# Patient Record
Sex: Female | Born: 1965
Health system: Southern US, Community
[De-identification: ages and names within clinical notes are randomized; demographics above are authoritative.]

## PROBLEM LIST (undated history)

## (undated) DIAGNOSIS — E785 Hyperlipidemia, unspecified: Secondary | ICD-10-CM

## (undated) DIAGNOSIS — J45909 Unspecified asthma, uncomplicated: Secondary | ICD-10-CM

## (undated) DIAGNOSIS — D219 Benign neoplasm of connective and other soft tissue, unspecified: Secondary | ICD-10-CM

## (undated) DIAGNOSIS — K219 Gastro-esophageal reflux disease without esophagitis: Secondary | ICD-10-CM

## (undated) DIAGNOSIS — M7750 Other enthesopathy of unspecified foot: Secondary | ICD-10-CM

## (undated) DIAGNOSIS — I10 Essential (primary) hypertension: Secondary | ICD-10-CM

## (undated) DIAGNOSIS — M1990 Unspecified osteoarthritis, unspecified site: Secondary | ICD-10-CM

## (undated) HISTORY — PX: WISDOM TOOTH EXTRACTION: SHX21

---

## 1998-06-02 ENCOUNTER — Other Ambulatory Visit: Admission: RE | Admit: 1998-06-02 | Discharge: 1998-06-02 | Payer: Self-pay | Admitting: *Deleted

## 1998-07-05 ENCOUNTER — Emergency Department (HOSPITAL_COMMUNITY): Admission: EM | Admit: 1998-07-05 | Discharge: 1998-07-05 | Payer: Self-pay | Admitting: Emergency Medicine

## 1998-09-09 ENCOUNTER — Emergency Department (HOSPITAL_COMMUNITY): Admission: EM | Admit: 1998-09-09 | Discharge: 1998-09-09 | Payer: Self-pay | Admitting: Emergency Medicine

## 1998-09-16 HISTORY — PX: CHOLECYSTECTOMY: SHX55

## 1998-11-06 ENCOUNTER — Encounter: Payer: Self-pay | Admitting: Internal Medicine

## 1998-11-06 ENCOUNTER — Ambulatory Visit (HOSPITAL_COMMUNITY): Admission: RE | Admit: 1998-11-06 | Discharge: 1998-11-06 | Payer: Self-pay | Admitting: Internal Medicine

## 1999-01-26 ENCOUNTER — Encounter: Payer: Self-pay | Admitting: General Surgery

## 1999-02-02 ENCOUNTER — Observation Stay (HOSPITAL_COMMUNITY): Admission: RE | Admit: 1999-02-02 | Discharge: 1999-02-03 | Payer: Self-pay | Admitting: General Surgery

## 2007-05-11 ENCOUNTER — Encounter: Admission: RE | Admit: 2007-05-11 | Discharge: 2007-07-31 | Payer: Self-pay | Admitting: Occupational Medicine

## 2014-04-06 ENCOUNTER — Ambulatory Visit
Admission: RE | Admit: 2014-04-06 | Discharge: 2014-04-06 | Disposition: A | Discharge status: Home / Self Care | Payer: 59 | Source: Ambulatory Visit | Attending: Family Medicine | Admitting: Family Medicine

## 2014-04-06 ENCOUNTER — Other Ambulatory Visit: Payer: Self-pay | Admitting: Family Medicine

## 2014-04-06 DIAGNOSIS — R1084 Generalized abdominal pain: Secondary | ICD-10-CM

## 2014-04-06 DIAGNOSIS — K5900 Constipation, unspecified: Secondary | ICD-10-CM

## 2014-04-07 ENCOUNTER — Other Ambulatory Visit: Payer: Self-pay | Admitting: Family Medicine

## 2014-04-07 DIAGNOSIS — R935 Abnormal findings on diagnostic imaging of other abdominal regions, including retroperitoneum: Secondary | ICD-10-CM

## 2014-04-08 ENCOUNTER — Ambulatory Visit
Admission: RE | Admit: 2014-04-08 | Discharge: 2014-04-08 | Disposition: A | Discharge status: Home / Self Care | Payer: 59 | Source: Ambulatory Visit | Attending: Family Medicine | Admitting: Family Medicine

## 2014-04-08 DIAGNOSIS — R935 Abnormal findings on diagnostic imaging of other abdominal regions, including retroperitoneum: Secondary | ICD-10-CM

## 2014-04-08 MED ORDER — IOHEXOL 300 MG/ML  SOLN
125.0000 mL | Freq: Once | INTRAMUSCULAR | Status: AC | PRN
  Administered 2014-04-08: 125 mL via INTRAVENOUS

## 2014-04-11 ENCOUNTER — Encounter (HOSPITAL_COMMUNITY): Payer: Self-pay | Admitting: Emergency Medicine

## 2014-04-11 ENCOUNTER — Emergency Department (HOSPITAL_COMMUNITY)
Admission: EM | Admit: 2014-04-11 | Discharge: 2014-04-11 | Disposition: A | Discharge status: Home / Self Care | Payer: 59 | Attending: Emergency Medicine | Admitting: Emergency Medicine

## 2014-04-11 DIAGNOSIS — R109 Unspecified abdominal pain: Secondary | ICD-10-CM | POA: Insufficient documentation

## 2014-04-11 DIAGNOSIS — Z8739 Personal history of other diseases of the musculoskeletal system and connective tissue: Secondary | ICD-10-CM | POA: Insufficient documentation

## 2014-04-11 DIAGNOSIS — N852 Hypertrophy of uterus: Secondary | ICD-10-CM | POA: Insufficient documentation

## 2014-04-11 DIAGNOSIS — R1110 Vomiting, unspecified: Secondary | ICD-10-CM

## 2014-04-11 DIAGNOSIS — K5900 Constipation, unspecified: Secondary | ICD-10-CM | POA: Insufficient documentation

## 2014-04-11 DIAGNOSIS — R112 Nausea with vomiting, unspecified: Secondary | ICD-10-CM | POA: Insufficient documentation

## 2014-04-11 DIAGNOSIS — J45909 Unspecified asthma, uncomplicated: Secondary | ICD-10-CM | POA: Insufficient documentation

## 2014-04-11 DIAGNOSIS — Z79899 Other long term (current) drug therapy: Secondary | ICD-10-CM | POA: Insufficient documentation

## 2014-04-11 DIAGNOSIS — I10 Essential (primary) hypertension: Secondary | ICD-10-CM | POA: Insufficient documentation

## 2014-04-11 HISTORY — DX: Unspecified asthma, uncomplicated: J45.909

## 2014-04-11 HISTORY — DX: Essential (primary) hypertension: I10

## 2014-04-11 HISTORY — DX: Other enthesopathy of unspecified foot and ankle: M77.50

## 2014-04-11 HISTORY — DX: Unspecified osteoarthritis, unspecified site: M19.90

## 2014-04-11 LAB — URINE MICROSCOPIC-ADD ON

## 2014-04-11 LAB — LIPASE, BLOOD: LIPASE: 24 U/L (ref 11–59)

## 2014-04-11 LAB — URINALYSIS, ROUTINE W REFLEX MICROSCOPIC
BILIRUBIN URINE: NEGATIVE
Glucose, UA: NEGATIVE mg/dL
KETONES UR: NEGATIVE mg/dL
NITRITE: NEGATIVE
Protein, ur: 30 mg/dL — AB
Specific Gravity, Urine: 1.021 (ref 1.005–1.030)
Urobilinogen, UA: 4 mg/dL — ABNORMAL HIGH (ref 0.0–1.0)
pH: 7 (ref 5.0–8.0)

## 2014-04-11 LAB — COMPREHENSIVE METABOLIC PANEL
ALT: 10 U/L (ref 0–35)
ANION GAP: 12 (ref 5–15)
AST: 14 U/L (ref 0–37)
Albumin: 3 g/dL — ABNORMAL LOW (ref 3.5–5.2)
Alkaline Phosphatase: 78 U/L (ref 39–117)
BUN: 11 mg/dL (ref 6–23)
CALCIUM: 8.9 mg/dL (ref 8.4–10.5)
CHLORIDE: 100 meq/L (ref 96–112)
CO2: 27 meq/L (ref 19–32)
CREATININE: 0.56 mg/dL (ref 0.50–1.10)
GFR calc Af Amer: >90 mL/min (ref >90)
Glucose, Bld: 106 mg/dL — ABNORMAL HIGH (ref 70–99)
Potassium: 3.3 mEq/L — ABNORMAL LOW (ref 3.7–5.3)
Sodium: 139 mEq/L (ref 137–147)
Total Bilirubin: 0.4 mg/dL (ref 0.3–1.2)
Total Protein: 8.6 g/dL — ABNORMAL HIGH (ref 6.0–8.3)

## 2014-04-11 LAB — CBC WITH DIFFERENTIAL/PLATELET
Basophils Absolute: 0.1 10*3/uL (ref 0.0–0.1)
Basophils Relative: 1 % (ref 0–1)
Eosinophils Absolute: 0.2 10*3/uL (ref 0.0–0.7)
Eosinophils Relative: 3 % (ref 0–5)
HEMATOCRIT: 32 % — AB (ref 36.0–46.0)
Hemoglobin: 10.2 g/dL — ABNORMAL LOW (ref 12.0–15.0)
LYMPHS ABS: 0.9 10*3/uL (ref 0.7–4.0)
Lymphocytes Relative: 14 % (ref 12–46)
MCH: 23.3 pg — AB (ref 26.0–34.0)
MCHC: 31.9 g/dL (ref 30.0–36.0)
MCV: 73.2 fL — AB (ref 78.0–100.0)
MONO ABS: 0.9 10*3/uL (ref 0.1–1.0)
MONOS PCT: 14 % — AB (ref 3–12)
NEUTROS ABS: 4.3 10*3/uL (ref 1.7–7.7)
Neutrophils Relative %: 68 % (ref 43–77)
PLATELETS: 315 10*3/uL (ref 150–400)
RBC: 4.37 MIL/uL (ref 3.87–5.11)
RDW: 18.8 % — ABNORMAL HIGH (ref 11.5–15.5)
WBC: 6.4 10*3/uL (ref 4.0–10.5)

## 2014-04-11 MED ORDER — ONDANSETRON HCL 4 MG/2ML IJ SOLN
4.0000 mg | Freq: Once | INTRAMUSCULAR | Status: AC
  Administered 2014-04-11: 4 mg via INTRAVENOUS

## 2014-04-11 MED ORDER — ONDANSETRON 4 MG PO TBDP: ORAL_TABLET | ORAL | Status: DC

## 2014-04-11 MED ORDER — ONDANSETRON HCL 4 MG/2ML IJ SOLN
4.0000 mg | Freq: Once | INTRAMUSCULAR | Status: AC
  Filled 2014-04-11: qty 2

## 2014-04-11 MED ORDER — SODIUM CHLORIDE 0.9 % IV BOLUS (SEPSIS)
1000.0000 mL | Freq: Once | INTRAVENOUS | Status: AC
Start: 2014-04-11 — End: 2014-04-11
  Administered 2014-04-11: 1000 mL via INTRAVENOUS

## 2014-04-11 MED ORDER — TRAMADOL HCL 50 MG PO TABS: 50.0000 mg | ORAL_TABLET | Freq: Four times a day (QID) | ORAL | Status: DC | PRN

## 2014-04-11 MED ORDER — POTASSIUM CHLORIDE CRYS ER 20 MEQ PO TBCR
40.0000 meq | EXTENDED_RELEASE_TABLET | Freq: Once | ORAL | Status: AC
  Administered 2014-04-11: 40 meq via ORAL
  Filled 2014-04-11: qty 2

## 2014-04-11 NOTE — ED Notes (Signed)
Pt states that she has had n/v since Wednesday. Pt states she had an xray last Wednesday and a CT on Friday at [facility] because she has had constipation x 2 weeks.

## 2014-04-11 NOTE — Discharge Instructions (Signed)
Call and make an appointment to followup with your OB/GYN. Make sure you take in plenty of fluids. Return immediately for persistent vomiting, inability to pass gas, worsening pain or for any concerns or  Uterine Fibroid A uterine fibroid is a growth (tumor) that occurs in your uterus. This type of tumor is not cancerous and does not spread out of the uterus. You can have one or many fibroids. Fibroids can vary in size, weight, and where they grow in the uterus. Some can become quite large. Most fibroids do not require medical treatment, but some can cause pain or heavy bleeding during and between periods. CAUSES  A fibroid is the result of a single uterine cell that keeps growing (unregulated), which is different than most cells in the human body. Most cells have a control mechanism that keeps them from reproducing without control.  SIGNS AND SYMPTOMS   Bleeding.  Pelvic pain and pressure.  Bladder problems due to the size of the fibroid.  Infertility and miscarriages depending on the size and location of the fibroid. DIAGNOSIS  Uterine fibroids are diagnosed through a physical exam. Your health care provider may feel the lumpy tumors during a pelvic exam. Ultrasonography may be done to get information regarding size, location, and number of tumors.  TREATMENT   Your health care provider may recommend watchful waiting. This involves getting the fibroid checked by your health care provider to see if it grows or shrinks.   Hormone treatment or an intrauterine device (IUD) may be prescribed.   Surgery may be needed to remove the fibroids (myomectomy) or the uterus (hysterectomy). This depends on your situation. When fibroids interfere with fertility and a woman wants to become pregnant, a health care provider may recommend having the fibroids removed.  [place] care depends on how you were treated. In general:   Keep all follow-up appointments with your health care  provider.   Only take over-the-counter or prescription medicines as directed by your health care provider. If you were prescribed a hormone treatment, take the hormone medicines exactly as directed. Do not take aspirin. It can cause bleeding.   Talk to your health care provider about taking iron pills.  If your periods are troublesome but not so heavy, lie down with your feet raised slightly above your heart. Place cold packs on your lower abdomen.   If your periods are heavy, write down the number of pads or tampons you use per month. Bring this information to your health care provider.   Include green vegetables in your diet.  SEEK IMMEDIATE MEDICAL CARE IF:  You have pelvic pain or cramps not controlled with medicines.   You have a sudden increase in pelvic pain.   You have an increase in bleeding between and during periods.   You have excessive periods and soak tampons or pads in a half hour or less.  You feel lightheaded or have fainting episodes. Document Released: 08/30/2000 Document Revised: 06/23/2013 Document Reviewed: 04/01/2013 [facility] Patient Information 2015 [place], [place]. This information is not intended to replace advice given to you by your health care provider. Make sure you discuss any questions you have with your health care provider.

## 2014-04-11 NOTE — ED Provider Notes (Signed)
CSN: [id number]     Arrival date & time 04/11/14  3785 History   First MD Initiated Contact with Patient 04/11/14 [phone]     Chief Complaint  Patient presents with  . Emesis  . Abdominal Pain     (Consider location/radiation/quality/duration/timing/severity/associated sxs/prior Treatment) HPI Patient states she's had intermittent nausea and vomiting for the past 6 days. She had a CT scan on Friday but does not have the results. She states she's been having difficulty having a bowel movement for the past 2 weeks. She is passing gas freely. Denies any fevers or chills. She has irregular heavy periods. She denies any urinary symptoms. She was started on Colace and MiraLax by her primary Dr. Past Medical History  Diagnosis Date  . Hypertension   . Tendonitis of foot   . Arthritis   . Asthma    Past Surgical History  Procedure Laterality Date  . Cholecystectomy  2000   No family history on file. History  Substance Use Topics  . Smoking status: Never Smoker   . Smokeless tobacco: Never Used  . Alcohol Use: No   OB History   Grav Para Term Preterm Abortions TAB SAB Ect Mult Living                 Review of Systems  Constitutional: Negative for fever and chills.  Respiratory: Negative for cough and shortness of breath.   Cardiovascular: Negative for chest pain.  Gastrointestinal: Positive for nausea, vomiting, abdominal pain and constipation. Negative for diarrhea.  Genitourinary: Negative for dysuria, frequency, hematuria, flank pain, enuresis and difficulty urinating.  Musculoskeletal: Negative for back pain, neck pain and neck stiffness.  Skin: Negative for rash and wound.  Neurological: Negative for dizziness, weakness, light-headedness, numbness and headaches.  All other systems reviewed and are negative.     Allergies  Review of patient's allergies indicates no known allergies.  Home Medications   Prior to Admission medications   Medication Sig Start Date End Date  Taking? Authorizing Provider  amLODipine (NORVASC) 2.5 MG tablet Take 2.5 mg by mouth daily.   Yes Historical Provider, MD  atorvastatin (LIPITOR) 40 MG tablet Take 40 mg by mouth daily.   Yes Historical Provider, MD  cholecalciferol (VITAMIN D) 1000 UNITS tablet Take 1,000 Units by mouth daily.   Yes Historical Provider, MD  docusate sodium (COLACE) 100 MG capsule Take 200 mg by mouth 2 (two) times daily.   Yes Historical Provider, MD   BP 128/79  Pulse 95  Temp(Src) 99.1 F (37.3 C) (Oral)  Resp 17  Ht 5\' 10"  (1.778 m)  Wt 253 lb (114.76 kg)  BMI 36.30 kg/m2  SpO2 99%  LMP 03/31/2014 Physical Exam  Nursing note and vitals reviewed. Constitutional: She is oriented to person, place, and time. She appears well-developed and well-nourished. No distress.  HENT:  Head: Normocephalic and atraumatic.  Mouth/Throat: Oropharynx is clear and moist.  Eyes: EOM are normal. Pupils are equal, round, and reactive to light.  Neck: Normal range of motion. Neck supple.  Cardiovascular: Normal rate and regular rhythm.   Pulmonary/Chest: Effort normal and breath sounds normal. No respiratory distress. She has no wheezes. She has no rales. She exhibits no tenderness.  Abdominal: Soft. Bowel sounds are normal. She exhibits mass. She exhibits no distension. There is no tenderness. There is no rebound and no guarding (large mass in the pelvic region.).  Musculoskeletal: Normal range of motion. She exhibits no edema and no tenderness.  No CVA tenderness bilaterally.  Neurological: She is alert and oriented to person, place, and time.  Moves all extremities without deficit. Sensation is grossly intact.  Skin: Skin is warm and dry. No rash noted. No erythema.  Psychiatric: She has a normal mood and affect. Her behavior is normal.    ED Course  Procedures (including critical care time) Labs Review Labs Reviewed  CBC WITH DIFFERENTIAL - Abnormal; Notable for the following:    Hemoglobin 10.2 (*)    HCT  32.0 (*)    MCV 73.2 (*)    MCH 23.3 (*)    RDW 18.8 (*)    All other components within normal limits  COMPREHENSIVE METABOLIC PANEL  LIPASE, BLOOD  URINALYSIS, ROUTINE W REFLEX MICROSCOPIC    Imaging Review No results found.   EKG Interpretation None      MDM   Final diagnoses:  None    Review of CT scan from Friday reveals markedly enlarged uterus with multiple urine masses displacing the bowel. No evidence of obstruction. We'll give IV fluids and treat nausea. We'll check electrolytes. The patient will need to followup with her OB/GYN for likely hysterectomy.  Mild hypokalemia replaced in the emergency department. Patient given IV fluids. No further vomiting in the emergency department. Return precautions given.  Julianne Rice, MD 04/11/14 [phone]

## 2014-05-30 ENCOUNTER — Encounter (HOSPITAL_COMMUNITY): Payer: Self-pay | Admitting: Pharmacist

## 2014-06-05 ENCOUNTER — Other Ambulatory Visit: Payer: Self-pay | Admitting: Obstetrics and Gynecology

## 2014-06-05 NOTE — H&P (Signed)
48 y.o. yo complains of a massively enlarged sx fibroid uterus.  Presented with c/o intermittent achy abdominal pain upper and lower which started with n/v; Pt. Was taking Zofran which has helped;  taking stool softener for constipation. Pt has massive fibroid uterus with multiple fibroids; on CT, unable to tell if degenerating or if sarcomatous. EM not seen today. Bleeding with cycles only - some months heavy and sometimes lighter. Lasting 7 days. NO IM at all, no postcoital bleeding.  Past Medical History  Diagnosis Date  . Hypertension   . Tendonitis of foot   . Arthritis   . Asthma    Past Surgical History  Procedure Laterality Date  . Cholecystectomy  2000    History   Social History  . Marital Status: Single    Spouse Name: N/A    Number of Children: N/A  . Years of Education: N/A   Occupational History  . Not on file.   Social History Main Topics  . Smoking status: Never Smoker   . Smokeless tobacco: Never Used  . Alcohol Use: No  . Drug Use: No  . Sexual Activity: Not on file   Other Topics Concern  . Not on file   Social History Narrative  . No narrative on file    No current facility-administered medications on file prior to encounter.   Current Outpatient Prescriptions on File Prior to Encounter  Medication Sig Dispense Refill  . atorvastatin (LIPITOR) 40 MG tablet Take 40 mg by mouth at bedtime.       . docusate sodium (COLACE) 100 MG capsule Take 200 mg by mouth daily.         No Known Allergies  @VITALS2 @  Lungs: clear to ascultation Cor:  RRR Abdomen:  soft, nontender, nondistended. Ex:  no cords, erythema Pelvic:  24-25 week size uterus with normal cervix and EFG.  [place] confirmed 22x13x13 size uterus with multiple fibroids.  A:  Sx and huge fibroid uterus with degen change unable to r/o sarcoma. D/w pt at length risks of sarcoma and possible need for further surgery if cancer. Quoted 1:1000 risk. Pt understands and strongly desires  TAH/Salpingectomies with me for now. I spent at least 15 minutes face to face with patient and instructed her on expected course of therapy, diagnosis and side effects. Pt voiced understanding. Plan to do TAH/salpingectomies/possible BSO/possible staging. Understands may have vertical incision on abdomen.  Pt to had surgical risks evaluated by PCP- low risk   P:  All risks, benefits and alternatives d/w patient and she desires to proceed.  Patient has undergone a modified bowel prep and will receive preop antibiotics and SCDs during the operation.     Jaslyn Bansal A

## 2014-06-06 ENCOUNTER — Encounter (HOSPITAL_COMMUNITY): Payer: Self-pay

## 2014-06-06 ENCOUNTER — Encounter (HOSPITAL_COMMUNITY)
Admission: RE | Admit: 2014-06-06 | Discharge: 2014-06-06 | Disposition: A | Discharge status: Home / Self Care | Payer: 59 | Source: Ambulatory Visit | Attending: Obstetrics and Gynecology | Admitting: Obstetrics and Gynecology

## 2014-06-06 DIAGNOSIS — I10 Essential (primary) hypertension: Secondary | ICD-10-CM | POA: Diagnosis not present

## 2014-06-06 DIAGNOSIS — J45909 Unspecified asthma, uncomplicated: Secondary | ICD-10-CM | POA: Diagnosis not present

## 2014-06-06 DIAGNOSIS — Z23 Encounter for immunization: Secondary | ICD-10-CM | POA: Diagnosis not present

## 2014-06-06 DIAGNOSIS — M129 Arthropathy, unspecified: Secondary | ICD-10-CM | POA: Diagnosis not present

## 2014-06-06 DIAGNOSIS — N831 Corpus luteum cyst of ovary, unspecified side: Secondary | ICD-10-CM | POA: Diagnosis not present

## 2014-06-06 DIAGNOSIS — D259 Leiomyoma of uterus, unspecified: Secondary | ICD-10-CM | POA: Diagnosis not present

## 2014-06-06 HISTORY — DX: Hyperlipidemia, unspecified: E78.5

## 2014-06-06 HISTORY — DX: Gastro-esophageal reflux disease without esophagitis: K21.9

## 2014-06-06 HISTORY — DX: Benign neoplasm of connective and other soft tissue, unspecified: D21.9

## 2014-06-06 LAB — BASIC METABOLIC PANEL
Anion gap: 11 (ref 5–15)
BUN: 20 mg/dL (ref 6–23)
CALCIUM: 8.6 mg/dL (ref 8.4–10.5)
CO2: 26 mEq/L (ref 19–32)
Chloride: 101 mEq/L (ref 96–112)
Creatinine, Ser: 0.53 mg/dL (ref 0.50–1.10)
GFR calc Af Amer: >90 mL/min (ref >90)
Glucose, Bld: 97 mg/dL (ref 70–99)
Potassium: 3.9 mEq/L (ref 3.7–5.3)
SODIUM: 138 meq/L (ref 137–147)

## 2014-06-06 LAB — CBC
HEMATOCRIT: 28.6 % — AB (ref 36.0–46.0)
Hemoglobin: 8.8 g/dL — ABNORMAL LOW (ref 12.0–15.0)
MCH: 21.8 pg — AB (ref 26.0–34.0)
MCHC: 30.8 g/dL (ref 30.0–36.0)
MCV: 70.8 fL — ABNORMAL LOW (ref 78.0–100.0)
Platelets: 296 10*3/uL (ref 150–400)
RBC: 4.04 MIL/uL (ref 3.87–5.11)
RDW: 16.4 % — ABNORMAL HIGH (ref 11.5–15.5)
WBC: 4.9 10*3/uL (ref 4.0–10.5)

## 2014-06-06 NOTE — Patient Instructions (Addendum)
   Your procedure is scheduled on:  Wednesday, Sept 23  Enter through the [employer] [facility] at: 8 AM Pick up the phone at the desk and dial [phone] and inform us of your arrival.  Please call this number if you have any problems the morning of surgery: [phone]  Remember: Do not eat or drink after midnight: Tuesday Take these medicines the morning of surgery with a SIP OF WATER:  Lisinopril, prevacid  Do not wear jewelry, make-up, or FINGER nail polish No metal in your hair or on your body. Do not wear lotions, powders, perfumes.  You may wear deodorant.  Do not bring valuables to the hospital. Contacts, dentures or bridgework may not be worn into surgery.  Leave suitcase in the car. After Surgery it may be brought to your room. For patients being admitted to the hospital, checkout time is 11:00am the day of discharge.  Home with - to be arranged prior to surgery.

## 2014-06-07 MED ORDER — CEFAZOLIN SODIUM-DEXTROSE 2-3 GM-% IV SOLR
2.0000 g | INTRAVENOUS | Status: AC
  Administered 2014-06-08: 2 g via INTRAVENOUS

## 2014-06-08 ENCOUNTER — Encounter (HOSPITAL_COMMUNITY)
Admission: RE | Disposition: A | Discharge status: Home / Self Care | Payer: Self-pay | Source: Ambulatory Visit | Attending: Obstetrics and Gynecology

## 2014-06-08 ENCOUNTER — Ambulatory Visit (HOSPITAL_COMMUNITY)
Admission: RE | Admit: 2014-06-08 | Discharge: 2014-06-10 | Disposition: A | Discharge status: Home / Self Care | Payer: 59 | Source: Ambulatory Visit | Attending: Obstetrics and Gynecology | Admitting: Obstetrics and Gynecology

## 2014-06-08 ENCOUNTER — Encounter (HOSPITAL_COMMUNITY): Payer: Self-pay | Admitting: Certified Registered Nurse Anesthetist

## 2014-06-08 ENCOUNTER — Encounter (HOSPITAL_COMMUNITY): Payer: 59 | Admitting: Certified Registered Nurse Anesthetist

## 2014-06-08 ENCOUNTER — Inpatient Hospital Stay (HOSPITAL_COMMUNITY): Payer: 59 | Admitting: Certified Registered Nurse Anesthetist

## 2014-06-08 DIAGNOSIS — M129 Arthropathy, unspecified: Secondary | ICD-10-CM | POA: Insufficient documentation

## 2014-06-08 DIAGNOSIS — D259 Leiomyoma of uterus, unspecified: Principal | ICD-10-CM | POA: Insufficient documentation

## 2014-06-08 DIAGNOSIS — J45909 Unspecified asthma, uncomplicated: Secondary | ICD-10-CM | POA: Insufficient documentation

## 2014-06-08 DIAGNOSIS — Z23 Encounter for immunization: Secondary | ICD-10-CM | POA: Insufficient documentation

## 2014-06-08 DIAGNOSIS — N831 Corpus luteum cyst of ovary, unspecified side: Secondary | ICD-10-CM | POA: Insufficient documentation

## 2014-06-08 DIAGNOSIS — Z9889 Other specified postprocedural states: Secondary | ICD-10-CM

## 2014-06-08 DIAGNOSIS — I10 Essential (primary) hypertension: Secondary | ICD-10-CM | POA: Insufficient documentation

## 2014-06-08 HISTORY — PX: OOPHORECTOMY: SHX6387

## 2014-06-08 HISTORY — PX: ABDOMINAL HYSTERECTOMY: SHX81

## 2014-06-08 HISTORY — PX: BILATERAL SALPINGECTOMY: SHX5743

## 2014-06-08 LAB — ABO/RH: ABO/RH(D): A POS

## 2014-06-08 LAB — CBC
HCT: 23.8 % — ABNORMAL LOW (ref 36.0–46.0)
Hemoglobin: 7.5 g/dL — ABNORMAL LOW (ref 12.0–15.0)
MCH: 22.1 pg — ABNORMAL LOW (ref 26.0–34.0)
MCHC: 31.5 g/dL (ref 30.0–36.0)
MCV: 70.2 fL — ABNORMAL LOW (ref 78.0–100.0)
Platelets: 249 10*3/uL (ref 150–400)
RBC: 3.39 MIL/uL — ABNORMAL LOW (ref 3.87–5.11)
RDW: 16.3 % — AB (ref 11.5–15.5)
WBC: 13.3 10*3/uL — AB (ref 4.0–10.5)

## 2014-06-08 LAB — PREPARE RBC (CROSSMATCH)

## 2014-06-08 LAB — PREGNANCY, URINE: PREG TEST UR: NEGATIVE

## 2014-06-08 SURGERY — HYSTERECTOMY, ABDOMINAL
Anesthesia: General | Site: Abdomen | Laterality: Right

## 2014-06-08 MED ORDER — KETOROLAC TROMETHAMINE 30 MG/ML IJ SOLN: 30.0000 mg | Freq: Four times a day (QID) | INTRAMUSCULAR | Status: DC

## 2014-06-08 MED ORDER — ACETAMINOPHEN 160 MG/5ML PO SOLN
ORAL | Status: AC
  Administered 2014-06-08: 960 mg via ORAL
  Filled 2014-06-08: qty 40.6

## 2014-06-08 MED ORDER — KETOROLAC TROMETHAMINE 30 MG/ML IJ SOLN
INTRAMUSCULAR | Status: DC | PRN
  Administered 2014-06-08: 30 mg via INTRAVENOUS

## 2014-06-08 MED ORDER — LACTATED RINGERS IV SOLN
INTRAVENOUS | Status: DC
  Administered 2014-06-08 (×3): via INTRAVENOUS

## 2014-06-08 MED ORDER — PHENYLEPHRINE 40 MCG/ML (10ML) SYRINGE FOR IV PUSH (FOR BLOOD PRESSURE SUPPORT)
PREFILLED_SYRINGE | INTRAVENOUS | Status: AC
  Filled 2014-06-08: qty 5

## 2014-06-08 MED ORDER — DOCUSATE SODIUM 100 MG PO CAPS
200.0000 mg | ORAL_CAPSULE | Freq: Every day | ORAL | Status: DC
  Administered 2014-06-09 – 2014-06-10 (×2): 200 mg via ORAL
  Filled 2014-06-08 (×2): qty 2

## 2014-06-08 MED ORDER — SCOPOLAMINE 1 MG/3DAYS TD PT72
MEDICATED_PATCH | TRANSDERMAL | Status: DC
Start: 2014-06-08 — End: 2014-06-10
  Administered 2014-06-08: 1.5 mg via TRANSDERMAL
  Filled 2014-06-08: qty 1

## 2014-06-08 MED ORDER — KETOROLAC TROMETHAMINE 30 MG/ML IJ SOLN
INTRAMUSCULAR | Status: AC
  Filled 2014-06-08: qty 1

## 2014-06-08 MED ORDER — FENTANYL CITRATE 0.05 MG/ML IJ SOLN
INTRAMUSCULAR | Status: AC
  Filled 2014-06-08: qty 5

## 2014-06-08 MED ORDER — PHENYLEPHRINE HCL 10 MG/ML IJ SOLN
INTRAMUSCULAR | Status: DC | PRN
  Administered 2014-06-08: 40 ug via INTRAVENOUS

## 2014-06-08 MED ORDER — INFLUENZA VAC SPLIT QUAD 0.5 ML IM SUSY
0.5000 mL | PREFILLED_SYRINGE | INTRAMUSCULAR | Status: AC
  Administered 2014-06-09: 0.5 mL via INTRAMUSCULAR

## 2014-06-08 MED ORDER — HYDROMORPHONE HCL 1 MG/ML IJ SOLN
INTRAMUSCULAR | Status: AC
  Administered 2014-06-08: 0.5 mg via INTRAVENOUS
  Filled 2014-06-08: qty 1

## 2014-06-08 MED ORDER — DEXAMETHASONE SODIUM PHOSPHATE 4 MG/ML IJ SOLN
INTRAMUSCULAR | Status: AC
  Filled 2014-06-08: qty 1

## 2014-06-08 MED ORDER — DEXMEDETOMIDINE HCL 200 MCG/2ML IV SOLN
INTRAVENOUS | Status: DC | PRN
  Administered 2014-06-08 (×10): 4 ug via INTRAVENOUS

## 2014-06-08 MED ORDER — OXYCODONE-ACETAMINOPHEN 5-325 MG PO TABS
1.0000 | ORAL_TABLET | ORAL | Status: DC | PRN
  Administered 2014-06-08 (×2): 2 via ORAL
  Administered 2014-06-09: 1 via ORAL
  Administered 2014-06-09 (×2): 2 via ORAL
  Administered 2014-06-09: 1 via ORAL
  Administered 2014-06-09 – 2014-06-10 (×2): 2 via ORAL
  Filled 2014-06-08: qty 2
  Filled 2014-06-08: qty 1
  Filled 2014-06-08 (×6): qty 2

## 2014-06-08 MED ORDER — ROCURONIUM BROMIDE 100 MG/10ML IV SOLN
INTRAVENOUS | Status: AC
  Filled 2014-06-08: qty 1

## 2014-06-08 MED ORDER — IBUPROFEN 800 MG PO TABS
800.0000 mg | ORAL_TABLET | Freq: Three times a day (TID) | ORAL | Status: DC | PRN
  Administered 2014-06-08 – 2014-06-10 (×4): 800 mg via ORAL
  Filled 2014-06-08 (×4): qty 1

## 2014-06-08 MED ORDER — NEOSTIGMINE METHYLSULFATE 10 MG/10ML IV SOLN
INTRAVENOUS | Status: DC | PRN
  Administered 2014-06-08: 5 mg via INTRAVENOUS

## 2014-06-08 MED ORDER — HYDROMORPHONE HCL 1 MG/ML IJ SOLN
0.2500 mg | INTRAMUSCULAR | Status: DC | PRN
  Administered 2014-06-08 (×4): 0.5 mg via INTRAVENOUS

## 2014-06-08 MED ORDER — ONDANSETRON HCL 4 MG/2ML IJ SOLN
INTRAMUSCULAR | Status: DC | PRN
  Administered 2014-06-08: 4 mg via INTRAVENOUS

## 2014-06-08 MED ORDER — LIDOCAINE HCL (CARDIAC) 20 MG/ML IV SOLN
INTRAVENOUS | Status: AC
  Filled 2014-06-08: qty 5

## 2014-06-08 MED ORDER — SCOPOLAMINE 1 MG/3DAYS TD PT72
1.0000 | MEDICATED_PATCH | Freq: Once | TRANSDERMAL | Status: DC
  Administered 2014-06-08: 1.5 mg via TRANSDERMAL

## 2014-06-08 MED ORDER — ONDANSETRON HCL 4 MG/2ML IJ SOLN: 4.0000 mg | Freq: Four times a day (QID) | INTRAMUSCULAR | Status: DC | PRN

## 2014-06-08 MED ORDER — ONDANSETRON HCL 4 MG PO TABS: 4.0000 mg | ORAL_TABLET | Freq: Four times a day (QID) | ORAL | Status: DC | PRN

## 2014-06-08 MED ORDER — SODIUM CHLORIDE 0.9 % IJ SOLN
INTRAMUSCULAR | Status: AC
Start: 2014-06-08 — End: 2014-06-08
  Filled 2014-06-08: qty 10

## 2014-06-08 MED ORDER — GLYCOPYRROLATE 0.2 MG/ML IJ SOLN
INTRAMUSCULAR | Status: AC
  Filled 2014-06-08: qty 4

## 2014-06-08 MED ORDER — ATORVASTATIN CALCIUM 40 MG PO TABS
40.0000 mg | ORAL_TABLET | Freq: Every day | ORAL | Status: DC
  Administered 2014-06-08 – 2014-06-09 (×2): 40 mg via ORAL
  Filled 2014-06-08 (×2): qty 1

## 2014-06-08 MED ORDER — LISINOPRIL 20 MG PO TABS
60.0000 mg | ORAL_TABLET | Freq: Every day | ORAL | Status: DC
  Administered 2014-06-09 – 2014-06-10 (×2): 60 mg via ORAL
  Filled 2014-06-08 (×2): qty 3

## 2014-06-08 MED ORDER — SODIUM CHLORIDE 0.9 % IJ SOLN
INTRAMUSCULAR | Status: AC
  Filled 2014-06-08: qty 10

## 2014-06-08 MED ORDER — PANTOPRAZOLE SODIUM 40 MG PO TBEC
40.0000 mg | DELAYED_RELEASE_TABLET | Freq: Every day | ORAL | Status: DC
  Administered 2014-06-09 – 2014-06-10 (×2): 40 mg via ORAL
  Filled 2014-06-08 (×2): qty 1

## 2014-06-08 MED ORDER — NEOSTIGMINE METHYLSULFATE 10 MG/10ML IV SOLN
INTRAVENOUS | Status: AC
  Filled 2014-06-08: qty 1

## 2014-06-08 MED ORDER — HEPARIN SODIUM (PORCINE) 5000 UNIT/ML IJ SOLN
INTRAMUSCULAR | Status: AC
  Filled 2014-06-08: qty 1

## 2014-06-08 MED ORDER — PROPOFOL 10 MG/ML IV EMUL
INTRAVENOUS | Status: AC
  Filled 2014-06-08: qty 20

## 2014-06-08 MED ORDER — MIDAZOLAM HCL 2 MG/2ML IJ SOLN
INTRAMUSCULAR | Status: DC | PRN
  Administered 2014-06-08: 2 mg via INTRAVENOUS

## 2014-06-08 MED ORDER — DEXAMETHASONE SODIUM PHOSPHATE 10 MG/ML IJ SOLN
INTRAMUSCULAR | Status: DC | PRN
  Administered 2014-06-08: 4 mg via INTRAVENOUS

## 2014-06-08 MED ORDER — HYDROMORPHONE HCL 1 MG/ML IJ SOLN
INTRAMUSCULAR | Status: AC
  Filled 2014-06-08: qty 1

## 2014-06-08 MED ORDER — ROCURONIUM BROMIDE 100 MG/10ML IV SOLN
INTRAVENOUS | Status: DC | PRN
  Administered 2014-06-08: 50 mg via INTRAVENOUS
  Administered 2014-06-08: 10 mg via INTRAVENOUS
  Administered 2014-06-08: 20 mg via INTRAVENOUS

## 2014-06-08 MED ORDER — MIDAZOLAM HCL 2 MG/2ML IJ SOLN
INTRAMUSCULAR | Status: AC
  Filled 2014-06-08: qty 2

## 2014-06-08 MED ORDER — HYDROMORPHONE HCL 1 MG/ML IJ SOLN
INTRAMUSCULAR | Status: DC | PRN
  Administered 2014-06-08: 0.5 mg via INTRAVENOUS

## 2014-06-08 MED ORDER — FENTANYL CITRATE 0.05 MG/ML IJ SOLN
INTRAMUSCULAR | Status: DC | PRN
  Administered 2014-06-08: 50 ug via INTRAVENOUS
  Administered 2014-06-08: 100 ug via INTRAVENOUS
  Administered 2014-06-08: 50 ug via INTRAVENOUS
  Administered 2014-06-08: 100 ug via INTRAVENOUS
  Administered 2014-06-08 (×4): 50 ug via INTRAVENOUS

## 2014-06-08 MED ORDER — HEPARIN SODIUM (PORCINE) 5000 UNIT/ML IJ SOLN
INTRAMUSCULAR | Status: DC | PRN
  Administered 2014-06-08: 5000 [IU]

## 2014-06-08 MED ORDER — ACETAMINOPHEN 160 MG/5ML PO SOLN
960.0000 mg | Freq: Four times a day (QID) | ORAL | Status: DC | PRN
  Administered 2014-06-08: 960 mg via ORAL

## 2014-06-08 MED ORDER — PROPOFOL 10 MG/ML IV BOLUS
INTRAVENOUS | Status: DC | PRN
  Administered 2014-06-08: 200 mg via INTRAVENOUS

## 2014-06-08 MED ORDER — ONDANSETRON HCL 4 MG/2ML IJ SOLN
INTRAMUSCULAR | Status: AC
  Filled 2014-06-08: qty 2

## 2014-06-08 MED ORDER — LIDOCAINE HCL (CARDIAC) 20 MG/ML IV SOLN
INTRAVENOUS | Status: DC | PRN
  Administered 2014-06-08: 100 mg via INTRAVENOUS

## 2014-06-08 MED ORDER — MELOXICAM 15 MG PO TABS
15.0000 mg | ORAL_TABLET | Freq: Every day | ORAL | Status: DC
  Administered 2014-06-09 – 2014-06-10 (×2): 15 mg via ORAL
  Filled 2014-06-08 (×2): qty 1

## 2014-06-08 MED ORDER — GLYCOPYRROLATE 0.2 MG/ML IJ SOLN
INTRAMUSCULAR | Status: DC | PRN
  Administered 2014-06-08: .8 mg via INTRAVENOUS

## 2014-06-08 MED ORDER — MENTHOL 3 MG MT LOZG: 1.0000 | LOZENGE | OROMUCOSAL | Status: DC | PRN

## 2014-06-08 MED ORDER — CEFAZOLIN SODIUM-DEXTROSE 2-3 GM-% IV SOLR
INTRAVENOUS | Status: AC
  Filled 2014-06-08: qty 50

## 2014-06-08 SURGICAL SUPPLY — 37 items
BENZOIN TINCTURE PRP APPL 2/3 (GAUZE/BANDAGES/DRESSINGS) ×5 IMPLANT
BLADE SURG 10 STRL SS (BLADE) ×10 IMPLANT
CANISTER SUCT 3000ML (MISCELLANEOUS) ×5 IMPLANT
CLOSURE WOUND 1/2 X4 (GAUZE/BANDAGES/DRESSINGS) ×1
CLOTH BEACON ORANGE TIMEOUT ST (SAFETY) ×5 IMPLANT
CONT PATH 16OZ SNAP LID 3702 (MISCELLANEOUS) ×5 IMPLANT
DECANTER SPIKE VIAL GLASS SM (MISCELLANEOUS) IMPLANT
DRAPE WARM FLUID 44X44 (DRAPE) IMPLANT
DRSG OPSITE POSTOP 4X10 (GAUZE/BANDAGES/DRESSINGS) ×5 IMPLANT
DURAPREP 26ML APPLICATOR (WOUND CARE) ×5 IMPLANT
GLOVE BIO SURGEON STRL SZ7 (GLOVE) ×5 IMPLANT
GLOVE BIOGEL PI IND STRL 7.0 (GLOVE) ×6 IMPLANT
GLOVE BIOGEL PI INDICATOR 7.0 (GLOVE) ×4
GOWN STRL REUS W/TWL LRG LVL3 (GOWN DISPOSABLE) ×10 IMPLANT
NEEDLE HYPO 25X1 1.5 SAFETY (NEEDLE) ×5 IMPLANT
PACK ABDOMINAL GYN (CUSTOM PROCEDURE TRAY) ×5 IMPLANT
PAD OB MATERNITY 4.3X12.25 (PERSONAL CARE ITEMS) ×5 IMPLANT
PROTECTOR NERVE ULNAR (MISCELLANEOUS) ×5 IMPLANT
SPONGE LAP 18X18 X RAY DECT (DISPOSABLE) ×10 IMPLANT
STAPLER VISISTAT 35W (STAPLE) IMPLANT
STRIP CLOSURE SKIN 1/2X4 (GAUZE/BANDAGES/DRESSINGS) ×4 IMPLANT
SUT CHROMIC 2 0 TIES 18 (SUTURE) IMPLANT
SUT PDS AB 0 CTX 60 (SUTURE) ×10 IMPLANT
SUT PLAIN 2 0 (SUTURE)
SUT PLAIN 2 0 XLH (SUTURE) IMPLANT
SUT PLAIN ABS 2-0 54XMFL TIE (SUTURE) IMPLANT
SUT VIC AB 0 CT1 18XCR BRD8 (SUTURE) ×9 IMPLANT
SUT VIC AB 0 CT1 27 (SUTURE) ×4
SUT VIC AB 0 CT1 27XBRD ANBCTR (SUTURE) ×6 IMPLANT
SUT VIC AB 0 CT1 8-18 (SUTURE) ×6
SUT VIC AB 2-0 CT1 (SUTURE) ×10 IMPLANT
SUT VIC AB 4-0 KS 27 (SUTURE) IMPLANT
SUT VICRYL 0 TIES 12 18 (SUTURE) ×5 IMPLANT
SYR CONTROL 10ML LL (SYRINGE) ×5 IMPLANT
TOWEL OR 17X24 6PK STRL BLUE (TOWEL DISPOSABLE) ×10 IMPLANT
TRAY FOLEY CATH 14FR (SET/KITS/TRAYS/PACK) ×5 IMPLANT
WATER STERILE IRR 1000ML POUR (IV SOLUTION) ×10 IMPLANT

## 2014-06-08 NOTE — Progress Notes (Signed)
There has been no change in the patients history, status or exam since the history and physical.  Filed Vitals:   06/08/14 0810  BP: 165/99  Pulse: 99  Temp: 98.2 F (36.8 C)  TempSrc: Oral  SpO2: 100%    Recent Results (from the past 2160 hour(s))  CBC WITH DIFFERENTIAL     Status: Abnormal   Collection Time    04/11/14  5:02 AM      Result Value Ref Range   WBC 6.4  4.0 - 10.5 K/uL   RBC 4.37  3.87 - 5.11 MIL/uL   Hemoglobin 10.2 (*) 12.0 - 15.0 g/dL   HCT 32.0 (*) 36.0 - 46.0 %   MCV 73.2 (*) 78.0 - 100.0 fL   MCH 23.3 (*) 26.0 - 34.0 pg   MCHC 31.9  30.0 - 36.0 g/dL   RDW 18.8 (*) 11.5 - 15.5 %   Platelets 315  150 - 400 K/uL   Neutrophils Relative % 68  43 - 77 %   Lymphocytes Relative 14  12 - 46 %   Monocytes Relative 14 (*) 3 - 12 %   Eosinophils Relative 3  0 - 5 %   Basophils Relative 1  0 - 1 %   Neutro Abs 4.3  1.7 - 7.7 K/uL   Lymphs Abs 0.9  0.7 - 4.0 K/uL   Monocytes Absolute 0.9  0.1 - 1.0 K/uL   Eosinophils Absolute 0.2  0.0 - 0.7 K/uL   Basophils Absolute 0.1  0.0 - 0.1 K/uL   Smear Review MORPHOLOGY UNREMARKABLE    COMPREHENSIVE METABOLIC PANEL     Status: Abnormal   Collection Time    04/11/14  5:02 AM      Result Value Ref Range   Sodium 139  137 - 147 mEq/L   Potassium 3.3 (*) 3.7 - 5.3 mEq/L   Chloride 100  96 - 112 mEq/L   CO2 27  19 - 32 mEq/L   Glucose, Bld 106 (*) 70 - 99 mg/dL   BUN 11  6 - 23 mg/dL   Creatinine, Ser 0.56  0.50 - 1.10 mg/dL   Calcium 8.9  8.4 - 10.5 mg/dL   Total Protein 8.6 (*) 6.0 - 8.3 g/dL   Albumin 3.0 (*) 3.5 - 5.2 g/dL   AST 14  0 - 37 U/L   ALT 10  0 - 35 U/L   Alkaline Phosphatase 78  39 - 117 U/L   Total Bilirubin 0.4  0.3 - 1.2 mg/dL   GFR calc non Af Amer >90  >90 mL/min   GFR calc Af Amer >90  >90 mL/min   Comment: (NOTE)     The eGFR has been calculated using the CKD EPI equation.     This calculation has not been validated in all clinical situations.     eGFR's persistently <90 mL/min signify  possible Chronic Kidney     Disease.   Anion gap 12  5 - 15  LIPASE, BLOOD     Status: None   Collection Time    04/11/14  5:02 AM      Result Value Ref Range   Lipase 24  11 - 59 U/L  URINALYSIS, ROUTINE W REFLEX MICROSCOPIC     Status: Abnormal   Collection Time    04/11/14  7:38 AM      Result Value Ref Range   Color, Urine YELLOW  YELLOW   APPearance CLOUDY (*) CLEAR   Specific Gravity,  Urine 1.021  1.005 - 1.030   pH 7.0  5.0 - 8.0   Glucose, UA NEGATIVE  NEGATIVE mg/dL   Hgb urine dipstick LARGE (*) NEGATIVE   Bilirubin Urine NEGATIVE  NEGATIVE   Ketones, ur NEGATIVE  NEGATIVE mg/dL   Protein, ur 30 (*) NEGATIVE mg/dL   Urobilinogen, UA 4.0 (*) 0.0 - 1.0 mg/dL   Nitrite NEGATIVE  NEGATIVE   Leukocytes, UA SMALL (*) NEGATIVE  URINE MICROSCOPIC-ADD ON     Status: Abnormal   Collection Time    04/11/14  7:38 AM      Result Value Ref Range   Squamous Epithelial / LPF RARE  RARE   WBC, UA 3-6  <3 WBC/hpf   RBC / HPF 3-6  <3 RBC/hpf   Bacteria, UA FEW (*) RARE   Urine-Other MUCOUS PRESENT    CBC     Status: Abnormal   Collection Time    06/06/14  8:40 AM      Result Value Ref Range   WBC 4.9  4.0 - 10.5 K/uL   RBC 4.04  3.87 - 5.11 MIL/uL   Hemoglobin 8.8 (*) 12.0 - 15.0 g/dL   HCT 28.6 (*) 36.0 - 46.0 %   MCV 70.8 (*) 78.0 - 100.0 fL   MCH 21.8 (*) 26.0 - 34.0 pg   MCHC 30.8  30.0 - 36.0 g/dL   RDW 16.4 (*) 11.5 - 15.5 %   Platelets 296  150 - 400 K/uL  BASIC METABOLIC PANEL     Status: None   Collection Time    06/06/14  8:40 AM      Result Value Ref Range   Sodium 138  137 - 147 mEq/L   Potassium 3.9  3.7 - 5.3 mEq/L   Chloride 101  96 - 112 mEq/L   CO2 26  19 - 32 mEq/L   Glucose, Bld 97  70 - 99 mg/dL   BUN 20  6 - 23 mg/dL   Creatinine, Ser 0.53  0.50 - 1.10 mg/dL   Calcium 8.6  8.4 - 10.5 mg/dL   GFR calc non Af Amer >90  >90 mL/min   GFR calc Af Amer >90  >90 mL/min   Comment: (NOTE)     The eGFR has been calculated using the CKD EPI equation.      This calculation has not been validated in all clinical situations.     eGFR's persistently <90 mL/min signify possible Chronic Kidney     Disease.   Anion gap 11  5 - 15  PREGNANCY, URINE     Status: None   Collection Time    06/08/14  7:50 AM      Result Value Ref Range   Preg Test, Ur NEGATIVE  NEGATIVE   Comment:            THE SENSITIVITY OF THIS     METHODOLOGY IS >20 mIU/mL.  TYPE AND SCREEN     Status: None   Collection Time    06/08/14  8:35 AM      Result Value Ref Range   ABO/RH(D) A POS     Antibody Screen PENDING     Sample Expiration 06/11/2014      Dawn Townsend A

## 2014-06-08 NOTE — Transfer of Care (Signed)
Immediate Anesthesia Transfer of Care Note  Patient: Dawn Townsend  Procedure(s) Performed: Procedure(s) with comments: HYSTERECTOMY ABDOMINAL/BILATERAL SALPINGECTOMY/POSSIBLE BILATERAL SALPINGO OOPHORECTOMY (Bilateral) - POSSIBLE STAGING BILATERAL SALPINGECTOMY (Right) OOPHORECTOMY (Left)  Patient Location: PACU  Anesthesia Type:General  Level of Consciousness: awake, alert , oriented and patient cooperative  Airway & Oxygen Therapy: Patient Spontanous Breathing and Patient connected to nasal cannula oxygen  Post-op Assessment: Report given to PACU RN and Post -op Vital signs reviewed and stable  Post vital signs: Reviewed and stable  Complications: No apparent anesthesia complications

## 2014-06-08 NOTE — Anesthesia Postprocedure Evaluation (Signed)
  Anesthesia Post-op Note  Patient: Dawn Townsend  Procedure(s) Performed: Procedure(s) with comments: HYSTERECTOMY ABDOMINAL/BILATERAL SALPINGECTOMY/POSSIBLE BILATERAL SALPINGO OOPHORECTOMY (Bilateral) - POSSIBLE STAGING BILATERAL SALPINGECTOMY (Right) OOPHORECTOMY (Left)  Patient Location: PACU  Anesthesia Type:General  Level of Consciousness: awake, alert  and oriented  Airway and Oxygen Therapy: Patient Spontanous Breathing and Patient connected to nasal cannula oxygen  Post-op Pain: mild   Post-op Assessment: Post-op Vital signs reviewed, Patient's Cardiovascular Status Stable, Respiratory Function Stable, Patent Airway, No signs of Nausea or vomiting and Pain level controlled  Post-op Vital Signs: Reviewed and stable  Last Vitals:  Filed Vitals:   06/08/14 1400  BP: 125/70  Pulse: 77  Temp:   Resp: 14    Complications: No apparent anesthesia complications

## 2014-06-08 NOTE — Brief Op Note (Signed)
06/08/2014  11:46 AM  PATIENT:  Dawn Townsend  48 y.o. female  PRE-OPERATIVE DIAGNOSIS:  FIBROIDS  POST-OPERATIVE DIAGNOSIS:  FIBROIDS  PROCEDURE:  TAH/LSO/R salpingectomy/washings SURGEON:  Surgeon(s) and Role:    * Daria Pastures, MD - Primary   ASSISTANTS: Dr. Gertie Fey   ANESTHESIA:   general  EBL:  Total I/O In: 2000 [I.V.:2000] Out: 800 [Urine:150; Blood:650]   SPECIMEN:  Source of Specimen:  Uterus, cervix, Left ovaries, bilateral tubes, washings  DISPOSITION OF SPECIMEN:  PATHOLOGY  COUNTS:  YES  TOURNIQUET:  * No tourniquets in log *  DICTATION: .Note written in EPIC  PLAN OF CARE: Admit to inpatient   PATIENT DISPOSITION:  PACU - hemodynamically stable.   Delay start of Pharmacological VTE agent (>24hrs) due to surgical blood loss or risk of bleeding: not applicable  Complications: none.  Medications:  None.  Findings: Large fibroid uterus with smooth serosa- weighed 1781 gm.  Ovaries were normal with small cysts on them but no other abnormalities.  The L ovary was adhesed to the posterior fundus and the uterine ovarian ligament continued to bleed after removal-the ovary was then sacrificed for hemostasis.  The tubes were normal.  The cul de sac was clear.    Technique:  After adequate general anesthesia was achieved, the patient was prepped and draped in the usual sterile fashion after the foley had been placed.  A Pfannenstiel incision was made with the scalpel and carried down to the fascia.  The fascia was incised with the scalpel in a transverse manner and extended in a transverse curvilinear manner.  The rectus muscles were split in the midline and a bowel free portion of the peritoneum was tented up.  It was entered into with the hemostat and extended in a superior and inferior manner with good visualization of the bowel and bladder.  The uterus was removed from the abdominal cavity and the balfour retractor was placed after the bowel was packed away  and the bladder retracted.     The L ovary was adhesed to the left tube and the posterior fundus.  The left uterine ovarian ligament was clamped with a kelly and incised against the uterus.  The pedical was secured with a tie on a pass and the mesosalpinx of the tube was incised underneath it with the bovie.  This allowed better mobilization of the uterus out of the abdominal cavity and the case continued. The round ligaments were secured with kellys, suture ligated with 0-vicryl bilaterally and incised with the bovie.  The bovie was then used to incise a transverse curvilinear incision in the vesico-cervical fascia. The incisions were extended to parallel the IP ligaments and the uterine arteries were skeletonized.  The mesosalpinx on the right side was incised with the bovie cautery and removed from the ovaries.  The uterine-ovarian ligaments on the right was then clamped with heaney clamps and incised with the mayo scissors.  The pedicle was then secured with a free tie and then a suture ligation.  The bladder flap was then retracted inferiorly with blunt and cautery dissection below the external cervical os.  The uterine arteries were clamped bilaterally with the heaneys and incised with the mayos.  Each pedicle was then secured with a stitch of 0-vicryl.  A second bite with the straight heaney was done bilaterally and secured with 0-vicryl.  The uterus was then amputed with the scalpel and the cervical stump was held with kochers.  A stitch was then placed  on the uterus where the L ovary had been attached to help prevent back bleeding.  The L ovary and pedicle were inspected and found to have bleeding both on the L ovary where a simple cyst had ruptured and also on the uterine ovarian pedicle on the ovary.  To achieve the best hemostasis, the IP ligament on the left was then clamped with a heaney, incised with the mayos and then secured with a 0 Vicryl tie and a stitch.  The cardinal ligament was then  divided with alternating successive bites of the straight heaney, incised with the scalpel and then secured with 0-vicryl.  At the level of the reflection of the vagina onto the cervix, two curved heaneys were placed and the cervix removed with the jorgensen scissors.  The cuff angles were then secured with 0-vicryl and the cuff was closed with interrupted figure of eight stitches of 0-vicryl.  Hemostasis was insured and the ureters were peristalsing well out of the field of dissection.  Irrigation was performed and hemostasis was assured.  The instruments and laps were removed from the abdominal cavity and the peritoneum was closed with a running stitch of 2-vicryl which incorporated the rectus muscles as a separate layer.  The fascia was closed with 0-vicryl in a running manner.  The subcutaneous tissue was closed with interrupted stitches of 3-0 plain gut after hemostasis was achieved with the bovie and irrigation.  The skin was closed with 3-0 vicryl sub-q with a keith needle and steri strips.    Pt tolerated the procedure well and was transferred to the recovery room in stable condition.

## 2014-06-08 NOTE — Op Note (Signed)
06/08/2014  11:46 AM  PATIENT:  Dawn Townsend  48 y.o. female  PRE-OPERATIVE DIAGNOSIS:  FIBROIDS  POST-OPERATIVE DIAGNOSIS:  FIBROIDS  PROCEDURE:  TAH/LSO/R salpingectomy/washings SURGEON:  Surgeon(s) and Role:    * Daria Pastures, MD - Primary   ASSISTANTS: Dr. Gertie Fey   ANESTHESIA:   general  EBL:  Total I/O In: 2000 [I.V.:2000] Out: 800 [Urine:150; Blood:650]   SPECIMEN:  Source of Specimen:  Uterus, cervix, Left ovaries, bilateral tubes, washings  DISPOSITION OF SPECIMEN:  PATHOLOGY  COUNTS:  YES  TOURNIQUET:  * No tourniquets in log *  DICTATION: .Note written in EPIC  PLAN OF CARE: Admit to inpatient   PATIENT DISPOSITION:  PACU - hemodynamically stable.   Delay start of Pharmacological VTE agent (>24hrs) due to surgical blood loss or risk of bleeding: not applicable  Complications: none.  Medications:  None.  Findings: Large fibroid uterus with smooth serosa- weighed 1781 gm.  Ovaries were normal with small cysts on them but no other abnormalities.  The L ovary was adhesed to the posterior fundus and the uterine ovarian ligament continued to bleed after removal-the ovary was then sacrificed for hemostasis.  The tubes were normal.  The cul de sac was clear.    Technique:  After adequate general anesthesia was achieved, the patient was prepped and draped in the usual sterile fashion after the foley had been placed.  A Pfannenstiel incision was made with the scalpel and carried down to the fascia.  The fascia was incised with the scalpel in a transverse manner and extended in a transverse curvilinear manner.  The rectus muscles were split in the midline and a bowel free portion of the peritoneum was tented up.  It was entered into with the hemostat and extended in a superior and inferior manner with good visualization of the bowel and bladder.  The uterus was removed from the abdominal cavity and the balfour retractor was placed after the bowel was packed away  and the bladder retracted.     The L ovary was adhesed to the left tube and the posterior fundus.  The left uterine ovarian ligament was clamped with a kelly and incised against the uterus.  The pedical was secured with a tie on a pass and the mesosalpinx of the tube was incised underneath it with the bovie.  This allowed better mobilization of the uterus out of the abdominal cavity and the case continued. The round ligaments were secured with kellys, suture ligated with 0-vicryl bilaterally and incised with the bovie.  The bovie was then used to incise a transverse curvilinear incision in the vesico-cervical fascia. The incisions were extended to parallel the IP ligaments and the uterine arteries were skeletonized.  The mesosalpinx on the right side was incised with the bovie cautery and removed from the ovaries.  The uterine-ovarian ligaments on the right was then clamped with heaney clamps and incised with the mayo scissors.  The pedicle was then secured with a free tie and then a suture ligation.  The bladder flap was then retracted inferiorly with blunt and cautery dissection below the external cervical os.  The uterine arteries were clamped bilaterally with the heaneys and incised with the mayos.  Each pedicle was then secured with a stitch of 0-vicryl.  A second bite with the straight heaney was done bilaterally and secured with 0-vicryl.  The uterus was then amputed with the scalpel and the cervical stump was held with kochers.  A stitch was then placed  on the uterus where the L ovary had been attached to help prevent back bleeding.  The L ovary and pedicle were inspected and found to have bleeding both on the L ovary where a simple cyst had ruptured and also on the uterine ovarian pedicle on the ovary.  To achieve the best hemostasis, the IP ligament on the left was then clamped with a heaney, incised with the mayos and then secured with a 0 Vicryl tie and a stitch.  The cardinal ligament was then  divided with alternating successive bites of the straight heaney, incised with the scalpel and then secured with 0-vicryl.  At the level of the reflection of the vagina onto the cervix, two curved heaneys were placed and the cervix removed with the jorgensen scissors.  The cuff angles were then secured with 0-vicryl and the cuff was closed with interrupted figure of eight stitches of 0-vicryl.  Hemostasis was insured and the ureters were peristalsing well out of the field of dissection.  Irrigation was performed and hemostasis was assured.  The instruments and laps were removed from the abdominal cavity and the peritoneum was closed with a running stitch of 2-vicryl which incorporated the rectus muscles as a separate layer.  The fascia was closed with 0-vicryl in a running manner.  The subcutaneous tissue was closed with interrupted stitches of 3-0 plain gut after hemostasis was achieved with the bovie and irrigation.  The skin was closed with 3-0 vicryl sub-q with a keith needle and steri strips.    Pt tolerated the procedure well and was transferred to the recovery room in stable condition.

## 2014-06-08 NOTE — Anesthesia Preprocedure Evaluation (Addendum)
Anesthesia Evaluation  Patient identified by MRN, date of birth, ID band Patient awake    Reviewed: Allergy & Precautions, H&P , Patient's Chart, lab work & pertinent test results, reviewed documented beta blocker date and time   Airway Mallampati: II TM Distance: >3 FB Neck ROM: full    Dental no notable dental hx.    Pulmonary  breath sounds clear to auscultation  Pulmonary exam normal       Cardiovascular hypertension (LVH, NSR. No Sx of CAD), On Medications Rhythm:regular Rate:Normal     Neuro/Psych    GI/Hepatic GERD-  Medicated,  Endo/Other  Morbid obesity  Renal/GU      Musculoskeletal   Abdominal   Peds  Hematology  (+) anemia ,   Anesthesia Other Findings Hypertension    Asthma   no inhaler GERD (gastroesophageal reflux disease)      MO Anemia      Reproductive/Obstetrics                        Anesthesia Physical Anesthesia Plan  ASA: III  Anesthesia Plan: General   Post-op Pain Management:    Induction: Intravenous  Airway Management Planned: Oral ETT  Additional Equipment:   Intra-op Plan:   Post-operative Plan: Extubation in OR  Informed Consent: I have reviewed the patients History and Physical, chart, labs and discussed the procedure including the risks, benefits and alternatives for the proposed anesthesia with the patient or authorized representative who has indicated his/her understanding and acceptance.   Dental Advisory Given and Dental advisory given  Plan Discussed with: CRNA and Surgeon  Anesthesia Plan Comments: (Pt worried about delirium on emergence. Will use precedex. T&C done. Discussed transfusion with Pt.   Discussed general anesthesia, including possible nausea, instrumentation of airway, sore throat,pulmonary aspiration, etc. I asked if the were any outstanding questions, or  concerns before we proceeded. )       Anesthesia Quick  Evaluation

## 2014-06-09 ENCOUNTER — Encounter (HOSPITAL_COMMUNITY): Payer: Self-pay | Admitting: Obstetrics and Gynecology

## 2014-06-09 DIAGNOSIS — D259 Leiomyoma of uterus, unspecified: Secondary | ICD-10-CM | POA: Diagnosis not present

## 2014-06-09 LAB — COMPREHENSIVE METABOLIC PANEL
ALBUMIN: 2.7 g/dL — AB (ref 3.5–5.2)
ALK PHOS: 61 U/L (ref 39–117)
ALT: 19 U/L (ref 0–35)
AST: 25 U/L (ref 0–37)
Anion gap: 11 (ref 5–15)
BUN: 9 mg/dL (ref 6–23)
CO2: 26 mEq/L (ref 19–32)
Calcium: 8.6 mg/dL (ref 8.4–10.5)
Chloride: 101 mEq/L (ref 96–112)
Creatinine, Ser: 0.56 mg/dL (ref 0.50–1.10)
GFR calc Af Amer: >90 mL/min (ref >90)
GFR calc non Af Amer: >90 mL/min (ref >90)
Glucose, Bld: 98 mg/dL (ref 70–99)
POTASSIUM: 3.9 meq/L (ref 3.7–5.3)
SODIUM: 138 meq/L (ref 137–147)
TOTAL PROTEIN: 6.4 g/dL (ref 6.0–8.3)
Total Bilirubin: 0.5 mg/dL (ref 0.3–1.2)

## 2014-06-09 LAB — CBC
HCT: 22.6 % — ABNORMAL LOW (ref 36.0–46.0)
HEMOGLOBIN: 7 g/dL — AB (ref 12.0–15.0)
MCH: 21.5 pg — ABNORMAL LOW (ref 26.0–34.0)
MCHC: 31 g/dL (ref 30.0–36.0)
MCV: 69.5 fL — ABNORMAL LOW (ref 78.0–100.0)
Platelets: 247 10*3/uL (ref 150–400)
RBC: 3.25 MIL/uL — ABNORMAL LOW (ref 3.87–5.11)
RDW: 16.4 % — AB (ref 11.5–15.5)
WBC: 8.2 10*3/uL (ref 4.0–10.5)

## 2014-06-09 MED ORDER — OXYCODONE-ACETAMINOPHEN 5-325 MG PO TABS: 1.0000 | ORAL_TABLET | ORAL | Status: DC | PRN

## 2014-06-09 MED FILL — Heparin Sodium (Porcine) Inj 5000 Unit/ML: INTRAMUSCULAR | Qty: 1 | Status: AC

## 2014-06-09 NOTE — Discharge Summary (Signed)
Physician Discharge Summary  Patient ID: Dawn Townsend MRN: [id number] DOB/AGE: 12/02/1965 48 y.o.  Admit date: 06/08/2014 Discharge date: 06/09/2014  Admission Diagnoses:  Discharge Diagnoses:  Active Problems:   Postoperative state   Discharged Condition: good  Hospital Course: Pt underwent uncomplicated TAH/LSO/salpingectomy with some blood loss but stable h/h post op day one.  CMET completely normal post op one.  Pt was eating and ambulating DOS and voiding post op one.    Consults: None  Significant Diagnostic Studies: labs:  Results for orders placed during the hospital encounter of 06/08/14 (from the past 24 hour(s))  PREGNANCY, URINE     Status: None   Collection Time    06/08/14  7:50 AM      Result Value Ref Range   Preg Test, Ur NEGATIVE  NEGATIVE  PREPARE RBC (CROSSMATCH)     Status: None   Collection Time    06/08/14  8:31 AM      Result Value Ref Range   Order Confirmation ORDER PROCESSED BY BLOOD BANK    TYPE AND SCREEN     Status: None   Collection Time    06/08/14  8:35 AM      Result Value Ref Range   ABO/RH(D) A POS     Antibody Screen NEG     Sample Expiration 06/11/2014     Unit Number [id number]     Blood Component Type RED CELLS,LR     Unit division 00     Status of Unit ALLOCATED     Transfusion Status OK TO TRANSFUSE     Crossmatch Result Compatible     Unit Number [id number]     Blood Component Type RED CELLS,LR     Unit division 00     Status of Unit ALLOCATED     Transfusion Status OK TO TRANSFUSE     Crossmatch Result Compatible     Unit Number [id number]     Blood Component Type RED CELLS,LR     Unit division 00     Status of Unit ALLOCATED     Transfusion Status OK TO TRANSFUSE     Crossmatch Result Compatible    ABO/RH     Status: None   Collection Time    06/08/14  8:35 AM      Result Value Ref Range   ABO/RH(D) A POS    PREPARE RBC (CROSSMATCH)     Status: None   Collection Time    06/08/14  9:00 AM      Result  Value Ref Range   Order Confirmation ORDER PROCESSED BY BLOOD BANK    CBC     Status: Abnormal   Collection Time    06/08/14 12:40 PM      Result Value Ref Range   WBC 13.3 (*) 4.0 - 10.5 K/uL   RBC 3.39 (*) 3.87 - 5.11 MIL/uL   Hemoglobin 7.5 (*) 12.0 - 15.0 g/dL   HCT 23.8 (*) 36.0 - 46.0 %   MCV 70.2 (*) 78.0 - 100.0 fL   MCH 22.1 (*) 26.0 - 34.0 pg   MCHC 31.5  30.0 - 36.0 g/dL   RDW 16.3 (*) 11.5 - 15.5 %   Platelets 249  150 - 400 K/uL  CBC     Status: Abnormal   Collection Time    06/09/14  4:54 AM      Result Value Ref Range   WBC 8.2  4.0 - 10.5 K/uL   RBC 3.25 (*)  3.87 - 5.11 MIL/uL   Hemoglobin 7.0 (*) 12.0 - 15.0 g/dL   HCT 22.6 (*) 36.0 - 46.0 %   MCV 69.5 (*) 78.0 - 100.0 fL   MCH 21.5 (*) 26.0 - 34.0 pg   MCHC 31.0  30.0 - 36.0 g/dL   RDW 16.4 (*) 11.5 - 15.5 %   Platelets 247  150 - 400 K/uL  COMPREHENSIVE METABOLIC PANEL     Status: Abnormal   Collection Time    06/09/14  4:54 AM      Result Value Ref Range   Sodium 138  137 - 147 mEq/L   Potassium 3.9  3.7 - 5.3 mEq/L   Chloride 101  96 - 112 mEq/L   CO2 26  19 - 32 mEq/L   Glucose, Bld 98  70 - 99 mg/dL   BUN 9  6 - 23 mg/dL   Creatinine, Ser 0.56  0.50 - 1.10 mg/dL   Calcium 8.6  8.4 - 10.5 mg/dL   Total Protein 6.4  6.0 - 8.3 g/dL   Albumin 2.7 (*) 3.5 - 5.2 g/dL   AST 25  0 - 37 U/L   ALT 19  0 - 35 U/L   Alkaline Phosphatase 61  39 - 117 U/L   Total Bilirubin 0.5  0.3 - 1.2 mg/dL   GFR calc non Af Amer >90  >90 mL/min   GFR calc Af Amer >90  >90 mL/min   Anion gap 11  5 - 15    Treatments: surgery: TAH/LSO/r salpingectomy  Discharge Exam: Blood pressure 130/76, pulse 91, temperature 97.9 F (36.6 C), temperature source Oral, resp. rate 18, height 5\' 10"  (1.778 m), weight 114.306 kg (252 lb), SpO2 100.00%.   Disposition: 01-Home or Self Care  Discharge Instructions   Call MD for:  temperature >100.4    Complete by:  As directed      Diet - low sodium heart healthy    Complete by:   As directed      Discharge instructions    Complete by:  As directed   No driving on narcotics, no sexual activity for 2 weeks.     Increase activity slowly    Complete by:  As directed      May shower / Bathe    Complete by:  As directed   Shower, no bath for 2 weeks.     Remove dressing in 24 hours    Complete by:  As directed      Sexual Activity Restrictions    Complete by:  As directed   No sexual activity for 2 weeks.            Medication List         atorvastatin 40 MG tablet  Commonly known as:  LIPITOR  Take 40 mg by mouth at bedtime.     docusate sodium 100 MG capsule  Commonly known as:  COLACE  Take 200 mg by mouth daily.     lansoprazole 30 MG capsule  Commonly known as:  PREVACID  Take 30 mg by mouth daily at 12 noon.     lisinopril 30 MG tablet  Commonly known as:  PRINIVIL,ZESTRIL  Take 60 mg by mouth daily.     meloxicam 15 MG tablet  Commonly known as:  MOBIC  Take 15 mg by mouth daily.     oxyCODONE-acetaminophen 5-325 MG per tablet  Commonly known as:  PERCOCET/ROXICET  Take 1-2 tablets by mouth every 4 (  four) hours as needed for severe pain (moderate to severe pain (when tolerating fluids)).           Follow-up Information   Follow up with Muslima Toppins A, MD In 2 weeks.   Specialty:  Obstetrics and Gynecology   Contact information:   [address] [place] [address] [phone]       Signed: Chakia Counts A 06/09/2014, 7:45 AM

## 2014-06-09 NOTE — Progress Notes (Addendum)
Patient is eating, ambulating, and foley removed  Pain control is good.  BP 130/76  Pulse 91  Temp(Src) 97.9 F (36.6 C) (Oral)  Resp 18  Ht 5\' 10"  (1.778 m)  Wt 114.306 kg (252 lb)  BMI 36.16 kg/m2  SpO2 100%  lungs:   clear to auscultation cor:    RRR Abdomen:  soft, appropriate tenderness, incisions intact and without erythema or exudate. ex:    no cords   Results for orders placed during the hospital encounter of 06/08/14 (from the past 24 hour(s))  PREGNANCY, URINE     Status: None   Collection Time    06/08/14  7:50 AM      Result Value Ref Range   Preg Test, Ur NEGATIVE  NEGATIVE  PREPARE RBC (CROSSMATCH)     Status: None   Collection Time    06/08/14  8:31 AM      Result Value Ref Range   Order Confirmation ORDER PROCESSED BY BLOOD BANK    TYPE AND SCREEN     Status: None   Collection Time    06/08/14  8:35 AM      Result Value Ref Range   ABO/RH(D) A POS     Antibody Screen NEG     Sample Expiration 06/11/2014     Unit Number [id number]     Blood Component Type RED CELLS,LR     Unit division 00     Status of Unit ALLOCATED     Transfusion Status OK TO TRANSFUSE     Crossmatch Result Compatible     Unit Number [id number]     Blood Component Type RED CELLS,LR     Unit division 00     Status of Unit ALLOCATED     Transfusion Status OK TO TRANSFUSE     Crossmatch Result Compatible     Unit Number [id number]     Blood Component Type RED CELLS,LR     Unit division 00     Status of Unit ALLOCATED     Transfusion Status OK TO TRANSFUSE     Crossmatch Result Compatible    ABO/RH     Status: None   Collection Time    06/08/14  8:35 AM      Result Value Ref Range   ABO/RH(D) A POS    PREPARE RBC (CROSSMATCH)     Status: None   Collection Time    06/08/14  9:00 AM      Result Value Ref Range   Order Confirmation ORDER PROCESSED BY BLOOD BANK    CBC     Status: Abnormal   Collection Time    06/08/14 12:40 PM      Result Value Ref Range   WBC  13.3 (*) 4.0 - 10.5 K/uL   RBC 3.39 (*) 3.87 - 5.11 MIL/uL   Hemoglobin 7.5 (*) 12.0 - 15.0 g/dL   HCT 23.8 (*) 36.0 - 46.0 %   MCV 70.2 (*) 78.0 - 100.0 fL   MCH 22.1 (*) 26.0 - 34.0 pg   MCHC 31.5  30.0 - 36.0 g/dL   RDW 16.3 (*) 11.5 - 15.5 %   Platelets 249  150 - 400 K/uL  CBC     Status: Abnormal   Collection Time    06/09/14  4:54 AM      Result Value Ref Range   WBC 8.2  4.0 - 10.5 K/uL   RBC 3.25 (*) 3.87 - 5.11 MIL/uL   Hemoglobin 7.0 (*)  12.0 - 15.0 g/dL   HCT 22.6 (*) 36.0 - 46.0 %   MCV 69.5 (*) 78.0 - 100.0 fL   MCH 21.5 (*) 26.0 - 34.0 pg   MCHC 31.0  30.0 - 36.0 g/dL   RDW 16.4 (*) 11.5 - 15.5 %   Platelets 247  150 - 400 K/uL  COMPREHENSIVE METABOLIC PANEL     Status: Abnormal   Collection Time    06/09/14  4:54 AM      Result Value Ref Range   Sodium 138  137 - 147 mEq/L   Potassium 3.9  3.7 - 5.3 mEq/L   Chloride 101  96 - 112 mEq/L   CO2 26  19 - 32 mEq/L   Glucose, Bld 98  70 - 99 mg/dL   BUN 9  6 - 23 mg/dL   Creatinine, Ser 0.56  0.50 - 1.10 mg/dL   Calcium 8.6  8.4 - 10.5 mg/dL   Total Protein 6.4  6.0 - 8.3 g/dL   Albumin 2.7 (*) 3.5 - 5.2 g/dL   AST 25  0 - 37 U/L   ALT 19  0 - 35 U/L   Alkaline Phosphatase 61  39 - 117 U/L   Total Bilirubin 0.5  0.3 - 1.2 mg/dL   GFR calc non Af Amer >90  >90 mL/min   GFR calc Af Amer >90  >90 mL/min   Anion gap 11  5 - 15    A/P  Routine care.  Expect d/c per plan.  H/H stable overnight. If voids, may go home this pm.

## 2014-06-10 DIAGNOSIS — D259 Leiomyoma of uterus, unspecified: Secondary | ICD-10-CM | POA: Diagnosis not present

## 2014-06-10 LAB — TYPE AND SCREEN
ABO/RH(D): A POS
Antibody Screen: NEGATIVE
UNIT DIVISION: 0
Unit division: 0
Unit division: 0

## 2014-06-10 NOTE — Progress Notes (Signed)
Patient is eating, ambulating, and voiding.  Pain control is good.  BP 136/74  Pulse 81  Temp(Src) 98.6 F (37 C) (Oral)  Resp 18  Ht 5\' 10"  (1.778 m)  Wt 114.306 kg (252 lb)  BMI 36.16 kg/m2  SpO2 100%  lungs:   clear to auscultation cor:    RRR Abdomen:  soft, appropriate tenderness, incisions intact and without erythema or exudate. ex:    no cords   Lab Results  Component Value Date   WBC 8.2 06/09/2014   HGB 7.0* 06/09/2014   HCT 22.6* 06/09/2014   MCV 69.5* 06/09/2014   PLT 247 06/09/2014    A/P  Routine care.  Expect d/c per plan.

## 2014-06-10 NOTE — Progress Notes (Signed)
Pt out in wheelchair teaching complete  

## 2014-08-31 ENCOUNTER — Ambulatory Visit
Admission: RE | Admit: 2014-08-31 | Discharge: 2014-08-31 | Disposition: A | Discharge status: Home / Self Care | Payer: 59 | Source: Ambulatory Visit | Attending: Family Medicine | Admitting: Family Medicine

## 2014-08-31 ENCOUNTER — Other Ambulatory Visit: Payer: Self-pay | Admitting: Family Medicine

## 2014-08-31 DIAGNOSIS — R0602 Shortness of breath: Secondary | ICD-10-CM

## 2014-10-28 ENCOUNTER — Ambulatory Visit
Admission: RE | Admit: 2014-10-28 | Discharge: 2014-10-28 | Disposition: A | Discharge status: Home / Self Care | Payer: 59 | Source: Ambulatory Visit | Attending: Family Medicine | Admitting: Family Medicine

## 2014-10-28 ENCOUNTER — Other Ambulatory Visit: Payer: Self-pay | Admitting: Family Medicine

## 2014-10-28 DIAGNOSIS — M5489 Other dorsalgia: Secondary | ICD-10-CM

## 2014-11-16 ENCOUNTER — Ambulatory Visit: Payer: 59 | Attending: Family Medicine | Admitting: Physical Therapy

## 2014-11-16 DIAGNOSIS — M545 Low back pain, unspecified: Secondary | ICD-10-CM

## 2014-11-16 DIAGNOSIS — R293 Abnormal posture: Secondary | ICD-10-CM | POA: Insufficient documentation

## 2014-11-16 DIAGNOSIS — R29898 Other symptoms and signs involving the musculoskeletal system: Secondary | ICD-10-CM | POA: Insufficient documentation

## 2014-11-16 NOTE — Patient Instructions (Signed)
ABDUCTION: Standing (Active)   Stand, feet flat. Lift right leg out to side. Use __0_ lbs. Complete 1-2_ sets of _10__ repetitions. Perform ___1-2 sessions per day. KEEP HIPS LEVEL AND DONT LEAN TO THE SIDE! http://gtsc.exer.us/111   Copyright  [employer]. All rights reserved.  Abduction: Clam (Eccentric) - Side-Lying   Lie on side with knees bent. Lift top knee, keeping feet together. Keep trunk steady. Slowly lower for 3-5 seconds. _10-20__ reps per set, _1-2__ sets per day, _5__ days per week.  Copyright  [employer]. All rights reserved.

## 2014-11-16 NOTE — Therapy (Signed)
[place], [place], [address] Phone: [phone]   Fax:  [phone]  Physical Therapy Evaluation  Patient Details  Name: Dawn Townsend MRN: [id number] Date of Birth: November 14, 1965 Referring Provider:  Rachell Cipro, MD  Encounter Date: 11/16/2014      PT End of Session - 11/16/14 1245    Visit Number 1   Number of Visits 12   Date for PT Re-Evaluation 12/28/14   PT Start Time 5686   PT Stop Time 1105   PT Time Calculation (min) 50 min   Activity Tolerance Patient tolerated treatment well      Past Medical History  Diagnosis Date  . Hypertension   . Tendonitis of foot   . SVD (spontaneous vaginal delivery)     x 1  . Hyperlipidemia   . Asthma     no inhaler  . GERD (gastroesophageal reflux disease)   . Arthritis     knee   . Fibroids     Past Surgical History  Procedure Laterality Date  . Cholecystectomy  2000  . Wisdom tooth extraction    . Abdominal hysterectomy Bilateral 06/08/2014    Procedure: HYSTERECTOMY ABDOMINAL/BILATERAL SALPINGECTOMY/POSSIBLE BILATERAL SALPINGO OOPHORECTOMY;  Surgeon: Daria Pastures, MD;  Location: [facility];  Service: Gynecology;  Laterality: Bilateral;  POSSIBLE STAGING  . Bilateral salpingectomy Right 06/08/2014    Procedure: BILATERAL SALPINGECTOMY;  Surgeon: Daria Pastures, MD;  Location: [facility];  Service: Gynecology;  Laterality: Right;  . Oophorectomy Left 06/08/2014    Procedure: OOPHORECTOMY;  Surgeon: Daria Pastures, MD;  Location: [facility];  Service: Gynecology;  Laterality: Left;    LMP 06/02/2014  Visit Diagnosis:  Midline low back pain without sciatica  Proximal leg weakness  Abnormal posture      Subjective Assessment - 11/16/14 1011    Symptoms Pain incr in Nov.  after surgery.  . Pain in low back, soreness in mm, stiffness, sensory changes prox hip, LE weakness (general), diff walking wears back brace.     Pertinent History  hysterectomy 05/2014, knee OA, DDD, gel shots in bilat.    Limitations Lifting;Standing;Walking;Other (comment)  during and after work and sit to stand, sleep   How long can you sit comfortably? as needed   How long can you stand comfortably? stands for 4 hours at a time at work, total of 8   How long can you walk comfortably? knees and back pain, never comfortable.    Diagnostic tests XR   Patient Stated Goals to be without pain   Currently in Pain? Yes   Pain Score 4    Pain Location Back  8/10 end of day   Pain Orientation Lower;Mid;Left   Pain Descriptors / Indicators Aching;Sore   Pain Type Chronic pain   Pain Onset More than a month ago   Pain Frequency Intermittent   Aggravating Factors  standing, sleeping on Lt. side   Pain Relieving Factors rest, heat          [facility] PT Assessment - 11/16/14 1024    Assessment   Medical Diagnosis low back pain (DDD, arthritis)   Onset Date --  chronic   Next MD Visit unknown   Prior Therapy years ago   Precautions   Precautions None   Required Braces or Orthoses --  recently bought a back and knee L brace   Restrictions   Weight Bearing Restrictions No   Balance Screen   Has the patient fallen  in the past 6 months No   Has the patient had a decrease in activity level because of a fear of falling?  No   Is the patient reluctant to leave their home because of a fear of falling?  No   Home Environment   Living Enviornment Private residence   [place] to enter   Entrance Stairs-Number of Steps 1   Prior Function   Level of Independence Independent with basic ADLs;Independent with homemaking with ambulation   Cognition   Overall Cognitive Status Within Functional Limits for tasks assessed   Sensation   Light Touch Appears Intact   Posture/Postural Control   Postural Limitations Rounded Shoulders;Forward head;Increased thoracic kyphosis;Flexed trunk   Posture Comments Low Rt. shoulder   genu valgus bilat.    AROM   Lumbar Flexion WNL   Lumbar Extension 50% pain   Lumbar - Right Side Bend WNL   Lumbar - Left Side Bend WNL   Lumbar - Right Rotation WNL   Lumbar - Left Rotation WNL pain   Strength   Right Hip Flexion 3+/5   Right Hip Extension 3+/5   Right Hip ABduction 3-/5   Left Hip Flexion 3/5   Left Hip Extension 3+/5   Left Hip ABduction 2+/5   Right Knee Flexion 4+/5   Right Knee Extension 4+/5   Left Knee Flexion 4/5   Left Knee Extension 4+/5   Flexibility   Hamstrings 60-70   Quadriceps tight L>Rt  tight hip flex bilat.    FABER test   findings Negative   Side --  bilat.    Comment knee pain L    Prone Knee Bend Test   Findings Negative   Side --  bilat.    Comment tight L>Rt   Straight Leg Raise   Findings Negative   Side  --  bilat.      Self care: POC/PT and HEP: Clam in sidelying Advised to do Hip ABD in standing because hip is too weak to be effective (<3/5) Prone MHP to L spine.       PT Education - 11/16/14 1244    Education provided Yes   Education Details HEP, hip weakness, core and posture in standing   Person(s) Educated Patient   Methods Demonstration;Explanation   Comprehension Verbalized understanding;Returned demonstration          PT Short Term Goals - 11/16/14 1257    PT SHORT TERM GOAL #1   Title Pt will be I with initial HEP   Time 2   Period Weeks   Status New   PT SHORT TERM GOAL #2   Title Pt will report mild decrease in pain after work (pain 6/10 or less)   Time 2   Period Weeks   Status New           PT Long Term Goals - 11/16/14 1258    PT LONG TERM GOAL #1   Title Pt will be demo and understand posture, body mechanics and how it relates to her home/work and ADLs.    Time 6   Status New   PT LONG TERM GOAL #2   Title Pt will be I with advanced HEP    Time 6   Period Weeks   Status New   PT LONG TERM GOAL #3   Title Pt will demo hip abd strength to 4/5 or more bilat. for improved  gait, knee pain    Time  6   Period Weeks   Status New   PT LONG TERM GOAL #4   Title Pt will work a full day and report less than 4/10 pain in low back    Time 6   Period Weeks   Status New   PT LONG TERM GOAL #5   Title Pt will wean OFF back brace completely   Time 6   Period Weeks   Status New               Plan - 11/16/14 1246    Clinical Impression Statement This patient presents with significant core/hip weakness and degenerative changes in L spine which have worsened since her surgery (Sept. 2015).  She will benefit from PT to improve her ability to work, tolerate standing and be more active in general.  She was very reluctant to attend PT but I think she understands the benefit.    Pt will benefit from skilled therapeutic intervention in order to improve on the following deficits Difficulty walking;Impaired flexibility;Improper body mechanics;Postural dysfunction;Impaired sensation;Decreased activity tolerance;Decreased endurance;Increased fascial restricitons;Pain;Increased muscle spasms;Decreased balance;Decreased mobility;Decreased strength   Rehab Potential Excellent   PT Frequency 2x / week   PT Duration 6 weeks   PT Treatment/Interventions Moist Heat;Therapeutic activities;Patient/family education;Therapeutic exercise;Traction;Ultrasound;Balance training;Manual techniques;Neuromuscular re-education;Cryotherapy;Electrical Stimulation;Functional mobility training   PT Next Visit Plan review hip strength HEP and advance, modalities if needed, manual   PT Home Exercise Plan hip abd and clam   Consulted and Agree with Plan of Care Patient         Problem List Patient Active Problem List   Diagnosis Date Noted  . Postoperative state 06/08/2014    PAA,JENNIFER 11/16/2014, 1:08 PM  [facility] [address] [place], [place], [address] Phone: [phone]   Fax:  [phone]

## 2014-11-29 ENCOUNTER — Encounter: Payer: 59 | Admitting: Physical Therapy

## 2014-12-01 ENCOUNTER — Ambulatory Visit: Payer: 59 | Admitting: Physical Therapy

## 2014-12-01 DIAGNOSIS — M545 Low back pain, unspecified: Secondary | ICD-10-CM

## 2014-12-01 DIAGNOSIS — R29898 Other symptoms and signs involving the musculoskeletal system: Secondary | ICD-10-CM

## 2014-12-01 DIAGNOSIS — R293 Abnormal posture: Secondary | ICD-10-CM

## 2014-12-01 NOTE — Therapy (Addendum)
[place], [place], [address] Phone: [phone]   Fax:  [phone]  Physical Therapy Treatment  Patient Details  Name: Dawn Townsend MRN: [id number] Date of Birth: [birth year] Referring Provider:  Fanny Bien, MD  Encounter Date: 12/01/2014      PT End of Session - 12/01/14 1248    Visit Number 2   Number of Visits 12   Date for PT Re-Evaluation 12/28/14   PT Start Time 1100   PT Stop Time 1200   PT Time Calculation (min) 60 min   Activity Tolerance Patient tolerated treatment well      Past Medical History  Diagnosis Date  . Hypertension   . Tendonitis of foot   . SVD (spontaneous vaginal delivery)     x 1  . Hyperlipidemia   . Asthma     no inhaler  . GERD (gastroesophageal reflux disease)   . Arthritis     knee   . Fibroids     Past Surgical History  Procedure Laterality Date  . Cholecystectomy  2000  . Wisdom tooth extraction    . Abdominal hysterectomy Bilateral 06/08/2014    Procedure: HYSTERECTOMY ABDOMINAL/BILATERAL SALPINGECTOMY/POSSIBLE BILATERAL SALPINGO OOPHORECTOMY;  Surgeon: Daria Pastures, MD;  Location: [facility];  Service: Gynecology;  Laterality: Bilateral;  POSSIBLE STAGING  . Bilateral salpingectomy Right 06/08/2014    Procedure: BILATERAL SALPINGECTOMY;  Surgeon: Daria Pastures, MD;  Location: [facility];  Service: Gynecology;  Laterality: Right;  . Oophorectomy Left 06/08/2014    Procedure: OOPHORECTOMY;  Surgeon: Daria Pastures, MD;  Location: [facility];  Service: Gynecology;  Laterality: Left;    There were no vitals filed for this visit.  Visit Diagnosis:  Midline low back pain without sciatica  Proximal leg weakness  Abnormal posture      Subjective Assessment - 12/01/14 1108    Symptoms Stiffness >pain.  esp with sit to stand. Still has severe pain at the end of her work day.    Currently in Pain? Yes   Pain Score 6    Pain Location Back   Pain  Orientation Lower   Pain Descriptors / Indicators Sore   Pain Type Chronic pain                       [facility] Adult PT Treatment/Exercise - 12/01/14 1117    Lumbar Exercises: Stretches   Single Knee to Chest Stretch 2 reps;10 seconds   Single Knee to Chest Stretch Limitations too much knee pain   Lower Trunk Rotation 5 reps;10 seconds   Pelvic Tilt --  A/P x 10 , 2 sets   Lumbar Exercises: Standing   Other Standing Lumbar Exercises hip abduction x 20 each leg    Lumbar Exercises: Supine   Ab Set 10 reps   Clam 20 reps   Heel Slides 10 reps   Bent Knee Raise 10 reps   Other Supine Lumbar Exercises SLR hip flexion    Lumbar Exercises: Sidelying   Clam 20 reps   Clam Limitations cues for breathing   Moist Heat Therapy   Number Minutes Moist Heat 15 Minutes   Moist Heat Location Other (comment)  lumbar in prone                PT Education - 12/01/14 1248    Education provided Yes   Education Details PT/POC, HEP for stab, posture   Person(s) Educated Patient  Methods Explanation;Demonstration;Handout   Comprehension Verbalized understanding;Verbal cues required;Need further instruction          PT Short Term Goals - 11/16/14 1257    PT SHORT TERM GOAL #1   Title Pt will be I with initial HEP   Time 2   Period Weeks   Status New   PT SHORT TERM GOAL #2   Title Pt will report mild decrease in pain after work (pain 6/10 or less)   Time 2   Period Weeks   Status New           PT Long Term Goals - 11/16/14 1258    PT LONG TERM GOAL #1   Title Pt will be demo and understand posture, body mechanics and how it relates to her home/work and ADLs.    Time 6   Status New   PT LONG TERM GOAL #2   Title Pt will be I with advanced HEP    Time 6   Period Weeks   Status New   PT LONG TERM GOAL #3   Title Pt will demo hip abd strength to 4/5 or more bilat. for improved gait, knee pain    Time 6   Period Weeks   Status New   PT LONG TERM GOAL #4    Title Pt will work a full day and report less than 4/10 pain in low back    Time 6   Period Weeks   Status New   PT LONG TERM GOAL #5   Title Pt will wean OFF back brace completely   Time 6   Period Weeks   Status New               Plan - 12/01/14 1249    Clinical Impression Statement No goals met, 2nd visit. She needs max cues to incorporate breathing into core stabilization.  Asked pt to pay attention to standing posture and support her spine with her abdominals.    PT Next Visit Plan review hip strength HEP and advance, modalities if needed, manual   PT Home Exercise Plan hip abd and clam and L stab.   Consulted and Agree with Plan of Care Patient        Problem List Patient Active Problem List   Diagnosis Date Noted  . Postoperative state 06/08/2014    Tabita Corbo 12/01/2014, 12:52 PM  [facility] [address] [place], [place], [address] Phone: [phone]   Fax:  [phone]     PHYSICAL THERAPY DISCHARGE SUMMARY  Visits from Start of Care: 2  Current functional level related to goals / functional outcomes: See above for goals, unknown    Remaining deficits: Unknown, has not returned.    Education / Equipment: PT/HEP, posture  Plan: Patient agrees to discharge.  Patient goals were not met. Patient is being discharged due to not returning since the last visit.  ?????    Raeford Razor, PT 05/18/2015 12:00 PM Phone: [phone] Fax: [phone]

## 2014-12-01 NOTE — Patient Instructions (Signed)

## 2014-12-14 ENCOUNTER — Encounter: Payer: 59 | Admitting: Physical Therapy

## 2015-01-11 ENCOUNTER — Encounter: Payer: 59 | Admitting: Physical Therapy

## 2015-09-07 ENCOUNTER — Encounter (HOSPITAL_COMMUNITY): Payer: Self-pay | Admitting: Emergency Medicine

## 2015-09-07 ENCOUNTER — Emergency Department (HOSPITAL_COMMUNITY)
Admission: EM | Admit: 2015-09-07 | Discharge: 2015-09-07 | Disposition: A | Discharge status: Home / Self Care | Payer: 59 | Attending: Emergency Medicine | Admitting: Emergency Medicine

## 2015-09-07 DIAGNOSIS — I10 Essential (primary) hypertension: Secondary | ICD-10-CM | POA: Diagnosis not present

## 2015-09-07 DIAGNOSIS — Z79899 Other long term (current) drug therapy: Secondary | ICD-10-CM | POA: Diagnosis not present

## 2015-09-07 DIAGNOSIS — Z791 Long term (current) use of non-steroidal anti-inflammatories (NSAID): Secondary | ICD-10-CM | POA: Insufficient documentation

## 2015-09-07 DIAGNOSIS — E785 Hyperlipidemia, unspecified: Secondary | ICD-10-CM | POA: Diagnosis not present

## 2015-09-07 DIAGNOSIS — M79644 Pain in right finger(s): Secondary | ICD-10-CM | POA: Diagnosis present

## 2015-09-07 DIAGNOSIS — J45909 Unspecified asthma, uncomplicated: Secondary | ICD-10-CM | POA: Insufficient documentation

## 2015-09-07 DIAGNOSIS — L03011 Cellulitis of right finger: Secondary | ICD-10-CM | POA: Insufficient documentation

## 2015-09-07 DIAGNOSIS — Z8739 Personal history of other diseases of the musculoskeletal system and connective tissue: Secondary | ICD-10-CM | POA: Diagnosis not present

## 2015-09-07 DIAGNOSIS — Z86018 Personal history of other benign neoplasm: Secondary | ICD-10-CM | POA: Diagnosis not present

## 2015-09-07 DIAGNOSIS — K219 Gastro-esophageal reflux disease without esophagitis: Secondary | ICD-10-CM | POA: Diagnosis not present

## 2015-09-07 MED ORDER — LIDOCAINE HCL 2 % IJ SOLN
10.0000 mL | Freq: Once | INTRAMUSCULAR | Status: AC
  Administered 2015-09-07: 20 mg via INTRADERMAL

## 2015-09-07 MED ORDER — LIDOCAINE HCL 2 % IJ SOLN
INTRAMUSCULAR | Status: AC
  Filled 2015-09-07: qty 20

## 2015-09-07 MED ORDER — IBUPROFEN 800 MG PO TABS: 800.0000 mg | ORAL_TABLET | Freq: Three times a day (TID) | ORAL | Status: DC | PRN

## 2015-09-07 MED ORDER — AMOXICILLIN-POT CLAVULANATE 875-125 MG PO TABS: 1.0000 | ORAL_TABLET | Freq: Two times a day (BID) | ORAL | Status: DC

## 2015-09-07 NOTE — ED Notes (Signed)
Bed: WA27 Expected date:  Expected time:  Means of arrival:  Comments: EVS

## 2015-09-07 NOTE — ED Provider Notes (Signed)
CSN: [id number]   Arrival date & time 09/07/15 2035  History  By signing my name below, I, Altamease Oiler, attest that this documentation has been prepared under the direction and in the presence of  Harlene Ramus PA-C Electronically Signed: Altamease Oiler, ED Scribe. 09/07/2015. 9:26 PM. Chief Complaint  Patient presents with  . Hand Pain    HPI The history is provided by the patient. No language interpreter was used.   Dawn Townsend is a 49 y.o. female who presents to the Emergency Department complaining of worsening, 8/10 in severity, sore/aching  pain and swelling at the nail bed of the right long finger with onset 2 days ago. Pt denies trauma to the finger but states that she may have pulled a hangnail from the area. She has no other complaint.    Past Medical History  Diagnosis Date  . Hypertension   . Tendonitis of foot   . SVD (spontaneous vaginal delivery)     x 1  . Hyperlipidemia   . Asthma     no inhaler  . GERD (gastroesophageal reflux disease)   . Arthritis     knee   . Fibroids     Past Surgical History  Procedure Laterality Date  . Cholecystectomy  2000  . Wisdom tooth extraction    . Abdominal hysterectomy Bilateral 06/08/2014    Procedure: HYSTERECTOMY ABDOMINAL/BILATERAL SALPINGECTOMY/POSSIBLE BILATERAL SALPINGO OOPHORECTOMY;  Surgeon: Daria Pastures, MD;  Location: [facility];  Service: Gynecology;  Laterality: Bilateral;  POSSIBLE STAGING  . Bilateral salpingectomy Right 06/08/2014    Procedure: BILATERAL SALPINGECTOMY;  Surgeon: Daria Pastures, MD;  Location: [facility];  Service: Gynecology;  Laterality: Right;  . Oophorectomy Left 06/08/2014    Procedure: OOPHORECTOMY;  Surgeon: Daria Pastures, MD;  Location: [facility];  Service: Gynecology;  Laterality: Left;    History reviewed. No pertinent family history.  Social History  Substance Use Topics  . Smoking status: Never Smoker   . Smokeless tobacco: Never Used  . Alcohol Use: No     Review of  Systems All other systems negative except as documented in the HPI. All pertinent positives and negatives as reviewed in the HPI. Home Medications   Prior to Admission medications   Medication Sig Start Date End Date Taking? Authorizing Provider  atorvastatin (LIPITOR) 40 MG tablet Take 40 mg by mouth at bedtime.     Historical Provider, MD  docusate sodium (COLACE) 100 MG capsule Take 200 mg by mouth daily.     Historical Provider, MD  lansoprazole (PREVACID) 30 MG capsule Take 30 mg by mouth daily at 12 noon.    Historical Provider, MD  lisinopril (PRINIVIL,ZESTRIL) 30 MG tablet Take 60 mg by mouth daily.    Historical Provider, MD  meloxicam (MOBIC) 15 MG tablet Take 15 mg by mouth daily.    Historical Provider, MD  oxyCODONE-acetaminophen (PERCOCET/ROXICET) 5-325 MG per tablet Take 1-2 tablets by mouth every 4 (four) hours as needed for severe pain (moderate to severe pain (when tolerating fluids)). Patient not taking: Reported on 11/16/2014 06/09/14   Bobbye Charleston, MD    Allergies  Review of patient's allergies indicates no known allergies.  Triage Vitals: BP 158/103 mmHg  Pulse 66  Temp(Src) 98.1 F (36.7 C) (Oral)  Resp 18  SpO2 100%  LMP 06/02/2014  Physical Exam  Constitutional: She is oriented to person, place, and time. She appears well-developed and well-nourished.  HENT:  Head: Normocephalic.  Eyes: EOM are normal.  Neck:  Normal range of motion.  Pulmonary/Chest: Effort normal.  Abdominal: She exhibits no distension.  Musculoskeletal: Normal range of motion.  Paronychia at the 3rd digit of the right hand along the lateral nail edge extending to the base of the nail with TTP and increased warmth.   Neurological: She is alert and oriented to person, place, and time.  Psychiatric: She has a normal mood and affect.  Nursing note and vitals reviewed.   ED Course  Procedures INCISION AND DRAINAGE PROCEDURE NOTE: Patient identification was confirmed and verbal  consent was obtained. This procedure was performed by Irena Cords PA-C at 9:15 PM. Site: right 3rd finger Sterile procedures observed Needle size: 25 guage Anesthetic used (type and amt): digital block with 3 ccs of lidocaine on each side of the finger Blade size: 11 Drainage: large amount Complexity: simple Site anesthetized, incision made over site, wound drained and rinsed with copious amounts of normal saline, woundcovered with dry, sterile dressing.  Pt tolerated procedure well without complications.  Instructions for care discussed verbally and pt provided with additional written instructions for homecare and f/u.   DIAGNOSTIC STUDIES: Oxygen Saturation is 100% on RA,  normal by my interpretation.    COORDINATION OF CARE: 9:02 PM Discussed treatment plan which includes paronychia I&D with pt at bedside and pt agreed to the plan.  Will give hand surgeons name. Told to return here as needed. Patient will be placed on Augmentin since she bit her nail and caused this issue thus exposing the area to oral flora   Dalia Heading, PA-C 09/07/15 2133  Milton Ferguson, MD 09/07/15 2256

## 2015-09-07 NOTE — ED Notes (Signed)
Pt has swelling on middle R finger around nail bed. Denies trauma. Alert and oriented.

## 2015-09-07 NOTE — Discharge Instructions (Signed)
Return here as needed. Follow up with the hand surgeon for any worsening in your condition. Soak in warm water and epsom salts. Keep the area clean and dry.

## 2015-09-15 ENCOUNTER — Other Ambulatory Visit: Payer: Self-pay | Admitting: Obstetrics and Gynecology

## 2015-09-19 LAB — CYTOLOGY - PAP

## 2015-09-19 MED FILL — LANSOPRAZOLE DR 30 MG CAP: 30 | 90 days supply | Qty: 90 | Fill #0

## 2015-09-22 MED FILL — FLUCONAZOLE 150 MG TABLET: 150 | 2 days supply | Qty: 2 | Fill #0

## 2015-09-29 DIAGNOSIS — I10 Essential (primary) hypertension: Secondary | ICD-10-CM | POA: Diagnosis not present

## 2015-09-29 DIAGNOSIS — K5900 Constipation, unspecified: Secondary | ICD-10-CM | POA: Diagnosis not present

## 2015-10-18 DIAGNOSIS — L0291 Cutaneous abscess, unspecified: Secondary | ICD-10-CM | POA: Diagnosis not present

## 2015-10-18 MED FILL — CEPHALEXIN 500 MG CAPSULE: 500 | 10 days supply | Qty: 40 | Fill #0

## 2015-10-18 MED FILL — CELECOXIB 200 MG CAPSULE: 200 | 30 days supply | Qty: 60 | Fill #1

## 2015-10-20 DIAGNOSIS — L0291 Cutaneous abscess, unspecified: Secondary | ICD-10-CM | POA: Diagnosis not present

## 2015-11-17 MED FILL — CELECOXIB 200 MG CAPSULE: 200 | 30 days supply | Qty: 60 | Fill #2

## 2015-11-17 MED FILL — MONTELUKAST SOD 10 MG TAB: 10 | 90 days supply | Qty: 90 | Fill #0

## 2015-11-17 MED FILL — LOSARTAN POTASSIUM 100 MG T: 100 | 90 days supply | Qty: 90 | Fill #1

## 2015-11-24 DIAGNOSIS — R1900 Intra-abdominal and pelvic swelling, mass and lump, unspecified site: Secondary | ICD-10-CM | POA: Diagnosis not present

## 2015-11-24 DIAGNOSIS — L0291 Cutaneous abscess, unspecified: Secondary | ICD-10-CM | POA: Diagnosis not present

## 2015-11-24 DIAGNOSIS — I10 Essential (primary) hypertension: Secondary | ICD-10-CM | POA: Diagnosis not present

## 2015-11-24 DIAGNOSIS — Z23 Encounter for immunization: Secondary | ICD-10-CM | POA: Diagnosis not present

## 2015-11-24 MED FILL — SULFAMETHOXAZOLE/TMP DS TAB: 800-160 | 14 days supply | Qty: 56 | Fill #0

## 2015-12-12 MED FILL — FLUCONAZOLE 150 MG TABLET: 150 | 3 days supply | Qty: 2 | Fill #0

## 2015-12-12 MED FILL — NITROFURANTOIN MONO-MCR 100: 100 | 7 days supply | Qty: 14 | Fill #0

## 2016-01-18 DIAGNOSIS — L84 Corns and callosities: Secondary | ICD-10-CM | POA: Diagnosis not present

## 2016-01-18 DIAGNOSIS — M79641 Pain in right hand: Secondary | ICD-10-CM | POA: Diagnosis not present

## 2016-01-18 DIAGNOSIS — M79642 Pain in left hand: Secondary | ICD-10-CM | POA: Diagnosis not present

## 2016-01-18 DIAGNOSIS — M170 Bilateral primary osteoarthritis of knee: Secondary | ICD-10-CM | POA: Diagnosis not present

## 2016-01-19 MED FILL — LANSOPRAZOLE DR 30 MG CAP: 30 | 90 days supply | Qty: 90 | Fill #1

## 2016-02-15 DIAGNOSIS — M170 Bilateral primary osteoarthritis of knee: Secondary | ICD-10-CM | POA: Diagnosis not present

## 2016-02-15 MED FILL — CELECOXIB 200 MG CAPSULE: 200 | 30 days supply | Qty: 60 | Fill #0

## 2016-02-21 MED FILL — LOSARTAN POTASSIUM 100 MG T: 100 | 90 days supply | Qty: 90 | Fill #2

## 2016-02-21 MED FILL — MONTELUKAST SOD 10 MG TAB: 10 | 90 days supply | Qty: 90 | Fill #0

## 2016-02-22 DIAGNOSIS — M170 Bilateral primary osteoarthritis of knee: Secondary | ICD-10-CM | POA: Diagnosis not present

## 2016-03-03 ENCOUNTER — Emergency Department (HOSPITAL_COMMUNITY): Payer: 59

## 2016-03-03 ENCOUNTER — Emergency Department (HOSPITAL_COMMUNITY)
Admission: EM | Admit: 2016-03-03 | Discharge: 2016-03-03 | Disposition: A | Discharge status: Home / Self Care | Payer: 59 | Attending: Emergency Medicine | Admitting: Emergency Medicine

## 2016-03-03 ENCOUNTER — Encounter (HOSPITAL_COMMUNITY): Payer: Self-pay | Admitting: Emergency Medicine

## 2016-03-03 DIAGNOSIS — E785 Hyperlipidemia, unspecified: Secondary | ICD-10-CM | POA: Insufficient documentation

## 2016-03-03 DIAGNOSIS — Z79899 Other long term (current) drug therapy: Secondary | ICD-10-CM | POA: Diagnosis not present

## 2016-03-03 DIAGNOSIS — I10 Essential (primary) hypertension: Secondary | ICD-10-CM | POA: Diagnosis not present

## 2016-03-03 DIAGNOSIS — L0291 Cutaneous abscess, unspecified: Secondary | ICD-10-CM

## 2016-03-03 DIAGNOSIS — R350 Frequency of micturition: Secondary | ICD-10-CM | POA: Diagnosis present

## 2016-03-03 DIAGNOSIS — M545 Low back pain, unspecified: Secondary | ICD-10-CM

## 2016-03-03 DIAGNOSIS — J45909 Unspecified asthma, uncomplicated: Secondary | ICD-10-CM | POA: Insufficient documentation

## 2016-03-03 DIAGNOSIS — L02416 Cutaneous abscess of left lower limb: Secondary | ICD-10-CM | POA: Diagnosis not present

## 2016-03-03 DIAGNOSIS — M179 Osteoarthritis of knee, unspecified: Secondary | ICD-10-CM | POA: Diagnosis not present

## 2016-03-03 DIAGNOSIS — Z792 Long term (current) use of antibiotics: Secondary | ICD-10-CM | POA: Diagnosis not present

## 2016-03-03 LAB — URINALYSIS, ROUTINE W REFLEX MICROSCOPIC
Glucose, UA: NEGATIVE mg/dL
Hgb urine dipstick: NEGATIVE
Ketones, ur: 15 mg/dL — AB
LEUKOCYTES UA: NEGATIVE
NITRITE: NEGATIVE
Protein, ur: NEGATIVE mg/dL
SPECIFIC GRAVITY, URINE: 1.032 — AB (ref 1.005–1.030)
pH: 6 (ref 5.0–8.0)

## 2016-03-03 LAB — PREGNANCY, URINE: PREG TEST UR: NEGATIVE

## 2016-03-03 MED ORDER — DIAZEPAM 5 MG PO TABS
5.0000 mg | ORAL_TABLET | Freq: Once | ORAL | Status: AC
  Administered 2016-03-03: 5 mg via ORAL
  Filled 2016-03-03: qty 1

## 2016-03-03 MED ORDER — LIDOCAINE HCL 2 % IJ SOLN
10.0000 mL | Freq: Once | INTRAMUSCULAR | Status: AC
  Administered 2016-03-03: 200 mg
  Filled 2016-03-03: qty 20

## 2016-03-03 MED ORDER — SULFAMETHOXAZOLE-TRIMETHOPRIM 800-160 MG PO TABS: 1.0000 | ORAL_TABLET | Freq: Two times a day (BID) | ORAL | Status: AC

## 2016-03-03 MED ORDER — NAPROXEN 500 MG PO TABS
500.0000 mg | ORAL_TABLET | Freq: Once | ORAL | Status: AC
  Administered 2016-03-03: 500 mg via ORAL
  Filled 2016-03-03: qty 1

## 2016-03-03 MED ORDER — METHOCARBAMOL 500 MG PO TABS: 500.0000 mg | ORAL_TABLET | Freq: Two times a day (BID) | ORAL | Status: DC

## 2016-03-03 MED ORDER — NAPROXEN 500 MG PO TABS: 500.0000 mg | ORAL_TABLET | Freq: Two times a day (BID) | ORAL | Status: DC

## 2016-03-03 NOTE — ED Notes (Signed)
Lidocaine at bedside.

## 2016-03-03 NOTE — ED Notes (Signed)
Pt c/o sharp lower back pain, urinary frequency, and burning with urination. Pt also c/o "hard knot" to left inner thigh.

## 2016-03-03 NOTE — Discharge Instructions (Signed)
Ms. Dawn Townsend,  Nice meeting you! Please follow-up with your primary care provider and neurosurgery as needed. Return to the emergency department if you develop fevers, chills, increased redness, nausea/vomiting, new/worsening symptoms. Feel better soon!  S. Wendie Simmer, PA-C

## 2016-03-04 DIAGNOSIS — R0600 Dyspnea, unspecified: Secondary | ICD-10-CM | POA: Diagnosis not present

## 2016-03-04 DIAGNOSIS — L0291 Cutaneous abscess, unspecified: Secondary | ICD-10-CM | POA: Diagnosis not present

## 2016-03-04 DIAGNOSIS — L02426 Furuncle of left lower limb: Secondary | ICD-10-CM | POA: Diagnosis not present

## 2016-03-04 DIAGNOSIS — I10 Essential (primary) hypertension: Secondary | ICD-10-CM | POA: Diagnosis not present

## 2016-03-12 NOTE — ED Provider Notes (Signed)
CSN: [id number]     Arrival date & time 03/03/16  1059 History   First MD Initiated Contact with Patient 03/03/16 1126     Chief Complaint  Patient presents with  . Back Pain  . Urinary Frequency   HPI   Dawn Townsend is a 50 y.o. female PMH significant for hypertension, hyperlipidemia, asthma presenting with a 2 day history of lower back pain, urinary frequency, dysuria, and a "hard knot" to left inner thigh. She denies fevers, chills, abdominal pain, nausea/vomiting, changes in bowel habits.     Past Medical History  Diagnosis Date  . Hypertension   . Tendonitis of foot   . SVD (spontaneous vaginal delivery)     x 1  . Hyperlipidemia   . Asthma     no inhaler  . GERD (gastroesophageal reflux disease)   . Arthritis     knee   . Fibroids    Past Surgical History  Procedure Laterality Date  . Cholecystectomy  2000  . Wisdom tooth extraction    . Abdominal hysterectomy Bilateral 06/08/2014    Procedure: HYSTERECTOMY ABDOMINAL/BILATERAL SALPINGECTOMY/POSSIBLE BILATERAL SALPINGO OOPHORECTOMY;  Surgeon: Daria Pastures, MD;  Location: [facility];  Service: Gynecology;  Laterality: Bilateral;  POSSIBLE STAGING  . Bilateral salpingectomy Right 06/08/2014    Procedure: BILATERAL SALPINGECTOMY;  Surgeon: Daria Pastures, MD;  Location: [facility];  Service: Gynecology;  Laterality: Right;  . Oophorectomy Left 06/08/2014    Procedure: OOPHORECTOMY;  Surgeon: Daria Pastures, MD;  Location: [facility];  Service: Gynecology;  Laterality: Left;   History reviewed. No pertinent family history. Social History  Substance Use Topics  . Smoking status: Never Smoker   . Smokeless tobacco: Never Used  . Alcohol Use: No   OB History    No data available     Review of Systems  Ten systems are reviewed and are negative for acute change except as noted in the HPI  Allergies  Review of patient's allergies indicates no known allergies.  Home Medications   Prior to Admission medications    Medication Sig Start Date End Date Taking? Authorizing Provider  amoxicillin-clavulanate (AUGMENTIN) 875-125 MG tablet Take 1 tablet by mouth every 12 (twelve) hours. 09/07/15   Dalia Heading, PA-C  atorvastatin (LIPITOR) 40 MG tablet Take 40 mg by mouth at bedtime.     Historical Provider, MD  docusate sodium (COLACE) 100 MG capsule Take 200 mg by mouth daily.     Historical Provider, MD  ibuprofen (ADVIL,MOTRIN) 800 MG tablet Take 1 tablet (800 mg total) by mouth every 8 (eight) hours as needed. 09/07/15   Dalia Heading, PA-C  lansoprazole (PREVACID) 30 MG capsule Take 30 mg by mouth daily at 12 noon.    Historical Provider, MD  lisinopril (PRINIVIL,ZESTRIL) 30 MG tablet Take 60 mg by mouth daily.    Historical Provider, MD  meloxicam (MOBIC) 15 MG tablet Take 15 mg by mouth daily.    Historical Provider, MD  methocarbamol (ROBAXIN) 500 MG tablet Take 1 tablet (500 mg total) by mouth 2 (two) times daily. 03/03/16   Dayton Lions, PA-C  naproxen (NAPROSYN) 500 MG tablet Take 1 tablet (500 mg total) by mouth 2 (two) times daily. 03/03/16   Lambert Lions, PA-C  oxyCODONE-acetaminophen (PERCOCET/ROXICET) 5-325 MG per tablet Take 1-2 tablets by mouth every 4 (four) hours as needed for severe pain (moderate to severe pain (when tolerating fluids)). Patient not taking: Reported on 11/16/2014 06/09/14   Bobbye Charleston, MD  BP 154/105 mmHg  Pulse 83  Temp(Src) 98.9 F (37.2 C) (Oral)  Resp 18  SpO2 98%  LMP 06/02/2014 Physical Exam  Constitutional: She appears well-developed and well-nourished. No distress.  HENT:  Head: Normocephalic and atraumatic.  Mouth/Throat: Oropharynx is clear and moist. No oropharyngeal exudate.  Eyes: Conjunctivae are normal. Pupils are equal, round, and reactive to light. Right eye exhibits no discharge. Left eye exhibits no discharge. No scleral icterus.  Neck: No tracheal deviation present.  Cardiovascular: Normal rate, regular rhythm,  normal heart sounds and intact distal pulses.  Exam reveals no gallop and no friction rub.   No murmur heard. Pulmonary/Chest: Effort normal and breath sounds normal. No respiratory distress. She has no wheezes. She has no rales. She exhibits no tenderness.  Abdominal: Soft. Bowel sounds are normal. She exhibits no distension and no mass. There is no tenderness. There is no rebound and no guarding.  Musculoskeletal: Normal range of motion. She exhibits tenderness. She exhibits no edema.  Midline lumbar tenderness without stepoff or deformity. NVI BL. Strength 5/5 throughout  Lymphadenopathy:    She has no cervical adenopathy.  Neurological: She is alert. Coordination normal.  Skin: Skin is warm and dry. No rash noted. She is not diaphoretic. There is erythema.  Left inner thigh with cellulitis and abscess  Psychiatric: She has a normal mood and affect. Her behavior is normal.  Nursing note and vitals reviewed.   ED Course  .Marland KitchenIncision and Drainage Date/Time: 03/03/2016 12:30 PM Performed by: Alisia Ferrari NICOLE Authorized by: Hollandale Lions Consent: Verbal consent obtained. Risks and benefits: risks, benefits and alternatives were discussed Consent given by: patient Patient understanding: patient states understanding of the procedure being performed Patient identity confirmed: verbally with patient and arm band Type: abscess Body area: lower extremity Location details: left leg Anesthesia: local infiltration Local anesthetic: lidocaine 2% without epinephrine Anesthetic total: 5 ml Scalpel size: 11 Incision type: single straight Complexity: simple Drainage: purulent Drainage amount: scant Wound treatment: wound left open Packing material: 1/4 in iodoform gauze Patient tolerance: Patient tolerated the procedure well with no immediate complications    Labs Review Labs Reviewed  URINALYSIS, ROUTINE W REFLEX MICROSCOPIC (NOT AT [facility]) - Abnormal; Notable for the  following:    Color, Urine AMBER (*)    APPearance CLOUDY (*)    Specific Gravity, Urine 1.032 (*)    Bilirubin Urine SMALL (*)    Ketones, ur 15 (*)    All other components within normal limits  PREGNANCY, URINE    Imaging Review Dg Lumbar Spine Complete  03/03/2016  CLINICAL DATA:  Sharp lower back pain.  No history of injury. EXAM: LUMBAR SPINE - COMPLETE 4+ VIEW COMPARISON:  10/28/2014 FINDINGS: There are 5 non rib-bearing vertebral bodies. The vertebral body heights and alignment are normal. There are multilevel osteoarthritic changes, worse in the lower lumbosacral spine. Most pronounced disc space narrowing, endplate sclerosis and mild remodeling of the vertebral bodies is seen at L3-L4 and L4-L5. Disc osteophyte complexes also suspected at these levels given anterior and posterior osteophyte formation. Posterior facet arthropathy is also seen in the lower lumbosacral spine. There is narrowing of bilateral bony neural foramina at L3-L4, L4-L5 and L5-S1. Cholecystectomy clips are noted. Soft tissues are grossly normal otherwise. IMPRESSION: Moderate to advanced osteoarthritic changes with lower lumbosacral spine predominance, with subsequent narrowing of the bilateral neural foramina. Electronically Signed   By: Fidela Salisbury M.D.   On: 03/03/2016 12:14   I have personally reviewed and  evaluated these images and lab results as part of my medical decision-making.  MDM   Final diagnoses:  Midline low back pain without sciatica  Abscess   Lumbar spine xray, UA, hcg negative for acute change. Patient with back pain.  No neurological deficits and normal neuro exam.  Patient can walk but states is painful.  No loss of bowel or bladder control.  No concern for cauda equina.  No fever, night sweats, weight loss, h/o cancer, IVDU.  RICE protocol and pain medicine indicated and discussed with patient.  Patient with skin abscess amenable to incision and drainage. Mild signs of cellulitis is  surrounding skin.  Will d/c to home with bactrim.  Patient may be safely discharged home. Discussed reasons for return. Patient to follow-up with primary care provider within one week. Patient in understanding and agreement with the plan.    Mishawaka Lions, PA-C 03/13/16 [id number]  Lacretia Leigh, MD 03/17/16 [phone]

## 2016-03-15 ENCOUNTER — Ambulatory Visit: Payer: 59 | Admitting: Physical Therapy

## 2016-03-20 DIAGNOSIS — M170 Bilateral primary osteoarthritis of knee: Secondary | ICD-10-CM | POA: Diagnosis not present

## 2016-03-29 ENCOUNTER — Other Ambulatory Visit (HOSPITAL_COMMUNITY): Payer: Self-pay | Admitting: Neurosurgery

## 2016-03-29 DIAGNOSIS — M545 Low back pain, unspecified: Secondary | ICD-10-CM

## 2016-03-29 DIAGNOSIS — I10 Essential (primary) hypertension: Secondary | ICD-10-CM | POA: Diagnosis not present

## 2016-03-29 DIAGNOSIS — G8929 Other chronic pain: Secondary | ICD-10-CM

## 2016-03-29 DIAGNOSIS — Z6839 Body mass index (BMI) 39.0-39.9, adult: Secondary | ICD-10-CM | POA: Diagnosis not present

## 2016-04-06 ENCOUNTER — Ambulatory Visit (HOSPITAL_COMMUNITY): Admission: RE | Admit: 2016-04-06 | Payer: 59 | Source: Ambulatory Visit

## 2016-04-17 DIAGNOSIS — R609 Edema, unspecified: Secondary | ICD-10-CM | POA: Diagnosis not present

## 2016-04-17 DIAGNOSIS — I10 Essential (primary) hypertension: Secondary | ICD-10-CM | POA: Diagnosis not present

## 2016-04-17 MED FILL — LOSARTAN-HCTZ 100-12.5 MG T: 100-12.5 | 90 days supply | Qty: 90 | Fill #0

## 2016-04-22 MED FILL — CELECOXIB 200 MG CAPSULE: 200 | 30 days supply | Qty: 60 | Fill #1

## 2016-04-24 DIAGNOSIS — L0291 Cutaneous abscess, unspecified: Secondary | ICD-10-CM | POA: Diagnosis not present

## 2016-04-24 MED FILL — SULFAMETHOXAZOLE/TMP DS TAB: 800-160 | 14 days supply | Qty: 56 | Fill #0

## 2016-04-26 ENCOUNTER — Ambulatory Visit (HOSPITAL_COMMUNITY)
Admission: RE | Admit: 2016-04-26 | Discharge: 2016-04-26 | Disposition: A | Discharge status: Home / Self Care | Payer: 59 | Source: Ambulatory Visit | Attending: Neurosurgery | Admitting: Neurosurgery

## 2016-04-26 DIAGNOSIS — M5136 Other intervertebral disc degeneration, lumbar region: Secondary | ICD-10-CM | POA: Insufficient documentation

## 2016-04-26 DIAGNOSIS — M545 Low back pain: Secondary | ICD-10-CM | POA: Insufficient documentation

## 2016-04-26 DIAGNOSIS — M5186 Other intervertebral disc disorders, lumbar region: Secondary | ICD-10-CM | POA: Insufficient documentation

## 2016-04-26 DIAGNOSIS — G8929 Other chronic pain: Secondary | ICD-10-CM | POA: Insufficient documentation

## 2016-06-21 MED FILL — MONTELUKAST SOD 10 MG TAB: 10 | 90 days supply | Qty: 90 | Fill #0

## 2016-06-21 MED FILL — LANSOPRAZOLE DR 30 MG CAP: 30 | 90 days supply | Qty: 90 | Fill #2

## 2016-06-26 DIAGNOSIS — M47816 Spondylosis without myelopathy or radiculopathy, lumbar region: Secondary | ICD-10-CM | POA: Diagnosis not present

## 2016-06-26 MED FILL — DICLOFENAC SOD 75 MG TAB EC: 75 | 30 days supply | Qty: 60 | Fill #0

## 2016-06-27 ENCOUNTER — Encounter: Payer: Self-pay | Admitting: Internal Medicine

## 2016-06-27 ENCOUNTER — Other Ambulatory Visit (INDEPENDENT_AMBULATORY_CARE_PROVIDER_SITE_OTHER): Payer: 59

## 2016-06-27 ENCOUNTER — Ambulatory Visit (INDEPENDENT_AMBULATORY_CARE_PROVIDER_SITE_OTHER): Payer: 59 | Admitting: Internal Medicine

## 2016-06-27 DIAGNOSIS — R300 Dysuria: Secondary | ICD-10-CM | POA: Insufficient documentation

## 2016-06-27 DIAGNOSIS — M25561 Pain in right knee: Secondary | ICD-10-CM | POA: Insufficient documentation

## 2016-06-27 DIAGNOSIS — J45909 Unspecified asthma, uncomplicated: Secondary | ICD-10-CM

## 2016-06-27 DIAGNOSIS — K219 Gastro-esophageal reflux disease without esophagitis: Secondary | ICD-10-CM | POA: Insufficient documentation

## 2016-06-27 DIAGNOSIS — G8929 Other chronic pain: Secondary | ICD-10-CM | POA: Insufficient documentation

## 2016-06-27 DIAGNOSIS — L723 Sebaceous cyst: Secondary | ICD-10-CM

## 2016-06-27 DIAGNOSIS — Z833 Family history of diabetes mellitus: Secondary | ICD-10-CM

## 2016-06-27 DIAGNOSIS — M25562 Pain in left knee: Secondary | ICD-10-CM

## 2016-06-27 DIAGNOSIS — L309 Dermatitis, unspecified: Secondary | ICD-10-CM | POA: Insufficient documentation

## 2016-06-27 DIAGNOSIS — I10 Essential (primary) hypertension: Secondary | ICD-10-CM

## 2016-06-27 DIAGNOSIS — M545 Low back pain: Secondary | ICD-10-CM

## 2016-06-27 DIAGNOSIS — L089 Local infection of the skin and subcutaneous tissue, unspecified: Secondary | ICD-10-CM | POA: Insufficient documentation

## 2016-06-27 LAB — CBC WITH DIFFERENTIAL/PLATELET
BASOS ABS: 0 10*3/uL (ref 0.0–0.1)
Basophils Relative: 0.5 % (ref 0.0–3.0)
Eosinophils Absolute: 0.1 10*3/uL (ref 0.0–0.7)
Eosinophils Relative: 2 % (ref 0.0–5.0)
HCT: 41.2 % (ref 36.0–46.0)
Hemoglobin: 13.5 g/dL (ref 12.0–15.0)
LYMPHS PCT: 22 % (ref 12.0–46.0)
Lymphs Abs: 1.1 10*3/uL (ref 0.7–4.0)
MCHC: 32.8 g/dL (ref 30.0–36.0)
MCV: 79.2 fl (ref 78.0–100.0)
Monocytes Absolute: 0.9 10*3/uL (ref 0.1–1.0)
Monocytes Relative: 17.7 % — ABNORMAL HIGH (ref 3.0–12.0)
Neutro Abs: 2.9 10*3/uL (ref 1.4–7.7)
Neutrophils Relative %: 57.8 % (ref 43.0–77.0)
PLATELETS: 240 10*3/uL (ref 150.0–400.0)
RBC: 5.21 Mil/uL — ABNORMAL HIGH (ref 3.87–5.11)
RDW: 16.6 % — ABNORMAL HIGH (ref 11.5–15.5)
WBC: 5 10*3/uL (ref 4.0–10.5)

## 2016-06-27 LAB — COMPREHENSIVE METABOLIC PANEL
ALT: 13 U/L (ref 0–35)
AST: 15 U/L (ref 0–37)
Albumin: 3.6 g/dL (ref 3.5–5.2)
Alkaline Phosphatase: 81 U/L (ref 39–117)
BILIRUBIN TOTAL: 0.6 mg/dL (ref 0.2–1.2)
BUN: 15 mg/dL (ref 6–23)
CALCIUM: 9.4 mg/dL (ref 8.4–10.5)
CHLORIDE: 102 meq/L (ref 96–112)
CO2: 32 mEq/L (ref 19–32)
Creatinine, Ser: 0.6 mg/dL (ref 0.40–1.20)
GFR: 136.33 mL/min (ref >60.00)
Glucose, Bld: 85 mg/dL (ref 70–99)
Potassium: 3.4 mEq/L — ABNORMAL LOW (ref 3.5–5.1)
Sodium: 139 mEq/L (ref 135–145)
Total Protein: 8.1 g/dL (ref 6.0–8.3)

## 2016-06-27 LAB — URINALYSIS, ROUTINE W REFLEX MICROSCOPIC
BILIRUBIN URINE: NEGATIVE
Hgb urine dipstick: NEGATIVE
KETONES UR: NEGATIVE
Leukocytes, UA: NEGATIVE
NITRITE: NEGATIVE
PH: 6.5 (ref 5.0–8.0)
Specific Gravity, Urine: 1.025 (ref 1.000–1.030)
TOTAL PROTEIN, URINE-UPE24: NEGATIVE
URINE GLUCOSE: NEGATIVE
UROBILINOGEN UA: 1 (ref 0.0–1.0)

## 2016-06-27 LAB — LIPID PANEL
CHOL/HDL RATIO: 3
Cholesterol: 204 mg/dL — ABNORMAL HIGH (ref 0–200)
HDL: 67.4 mg/dL (ref >39.00)
LDL Cholesterol: 123 mg/dL — ABNORMAL HIGH (ref 0–99)
NONHDL: 136.81
Triglycerides: 70 mg/dL (ref 0.0–149.0)
VLDL: 14 mg/dL (ref 0.0–40.0)

## 2016-06-27 LAB — HEMOGLOBIN A1C: Hgb A1c MFr Bld: 5.8 % (ref 4.6–6.5)

## 2016-06-27 MED ORDER — SULFAMETHOXAZOLE-TRIMETHOPRIM 800-160 MG PO TABS: 1.0000 | ORAL_TABLET | Freq: Two times a day (BID) | ORAL | 0 refills | Status: DC

## 2016-06-27 MED ORDER — MONTELUKAST SODIUM 10 MG PO TABS: 10.0000 mg | ORAL_TABLET | Freq: Every day | ORAL | 3 refills | Status: DC

## 2016-06-27 MED FILL — SULFAMETHOXAZOLE/TMP DS TAB: 800-160 | 10 days supply | Qty: 20 | Fill #0

## 2016-06-27 NOTE — Patient Instructions (Addendum)
You can probiotics for your constipation - they come in pills and are in the vitamin section of your pharmacy.       Test(s) ordered today. Your results will be released to [facility] (or called to you) after review, usually within 72hours after test completion. If any changes need to be made, you will be notified at that same time.  All other Health Maintenance issues reviewed.   All recommended immunizations and age-appropriate screenings are up-to-date or discussed.  No immunizations administered today.   Medications reviewed and updated.  Changes include starting an antibiotic - Bactrim for your infection cyst and possible UTI.    Your prescription(s) have been submitted to your pharmacy. Please take as directed and contact our office if you believe you are having problem(s) with the medication(s).   Please followup in 3 months

## 2016-06-27 NOTE — Assessment & Plan Note (Addendum)
Chronic in nature and related to osteoarthritis She had an MRI done this year and is currently following with pain management Prescribed a pain medication-if this is not effective she will be considered for injections Will start water aerobics and work on weight loss

## 2016-06-27 NOTE — Assessment & Plan Note (Signed)
Small boil that appears to be infected right axilla Bactrim twice a day 10 days Advised warm compresses If no improvement may need an incision and drainage

## 2016-06-27 NOTE — Assessment & Plan Note (Signed)
GERD controlled Continue daily medication Will work on weight loss

## 2016-06-27 NOTE — Assessment & Plan Note (Signed)
Blood pressure has not been controlled until recently Continue current medication at current dose, which she is tolerating well Follow-up in 3 months CMP today

## 2016-06-27 NOTE — Assessment & Plan Note (Signed)
She understands the importance of weight loss She has related hypertension, back pain and knee arthritis Will start water aerobics

## 2016-06-27 NOTE — Progress Notes (Signed)
Pre visit review using our clinic review tool, if applicable. No additional management support is needed unless otherwise documented below in the visit note. 

## 2016-06-27 NOTE — Assessment & Plan Note (Signed)
Symptoms controlled with Singulair-we'll continue Denies daily or frequent cough, wheeze or shortness of breath

## 2016-06-27 NOTE — Assessment & Plan Note (Signed)
Chronic in nature We'll check urine to rule out an infection They have an element of interstitial cystitis If no improvement or if symptoms persist may need to see urology

## 2016-06-27 NOTE — Progress Notes (Signed)
Subjective:    Patient ID: Dawn Townsend, female    DOB: 11-12-1965, 50 y.o.   MRN: [id number]  HPI She is here to establish with a new pcp.    Chronic back pain-related to osteoarthritis: She recently went to pain management for her chronic back pain. He did start her on an oral pain medication, but she does not recall the name. She has not started the medication. If the medication does not help she will be considered for injections. She has had an MRI. She has chronic, constant lower back pain that does not radiate. She has done physical therapy in the past and it aggravated her knee pain. She has been out of work twice in the past for this pain.  Knee pain, bilateral-osteoarthritis:  She is following with orthopedics-Dr. Lynann Bologna . She has had cortisone injections and is also tried gel injections. The gel injections have not helped. She will be having additional cortisone injections soon. She is currently not exercising and knows she needs to start. She knows how important weight loss is. Her knee pain.   ? UTI:  She has dysuria, which is chronic in nature. This started after her hysterectomy. She denies any blood in the urine. Some days her urine frequency is increased, but it is not consistently increased.  Recurrent boils: She has had several boils over the past year-5 to be exact. She currently has a small one starting under her right arm. She is concerned that will get bigger and more painful.  Obesity: She'll she needs to lose weight. She is currently not exercising, but plans on trying water aerobics. She has decreased, soda she drinks and is only drinking 1 can a day, she drinks a lot of water. She is eating more salads and trying to eat more vegetables.  Constipation:  She takes 4 stool softeners twice a day.  She has hard painful stool .  She needs to take an enema on occasion.  She has just started MiraLAX and is unsure if it is helping. She states her bowels changed after she had her  hysterectomy. The constipation comes and goes.     Hypertension: She is taking her medication daily. She is compliant with a low sodium diet.  She denies chest pain, palpitations, edema, shortness of breath and regular headaches. She is not exercising regularly.  She does monitor her blood pressure at home and it has been well controlled.    GERD:  She is taking her medication daily as prescribed.  She denies any GERD symptoms and feels her GERD is well controlled.   Asthma:  Since childhood.  She takes singulair daily.   Her triggers include heat/humidity, upper respiratory infections. She does not use any inhalers and does not feel that she needs them.     Medications and allergies reviewed with patient and updated if appropriate.  Patient Active Problem List   Diagnosis Date Noted  . Postoperative state 06/08/2014    Current Outpatient Prescriptions on File Prior to Visit  Medication Sig Dispense Refill  . lansoprazole (PREVACID) 30 MG capsule Take 30 mg by mouth daily at 12 noon.    . naproxen (NAPROSYN) 500 MG tablet Take 1 tablet (500 mg total) by mouth 2 (two) times daily. 30 tablet 0  . oxyCODONE-acetaminophen (PERCOCET/ROXICET) 5-325 MG per tablet Take 1-2 tablets by mouth every 4 (four) hours as needed for severe pain (moderate to severe pain (when tolerating fluids)). (Patient not taking: Reported on 11/16/2014)  30 tablet 0   No current facility-administered medications on file prior to visit.     Past Medical History:  Diagnosis Date  . Arthritis    knee   . Asthma    no inhaler  . Fibroids   . GERD (gastroesophageal reflux disease)   . Hyperlipidemia   . Hypertension   . SVD (spontaneous vaginal delivery)    x 1  . Tendonitis of foot     Past Surgical History:  Procedure Laterality Date  . ABDOMINAL HYSTERECTOMY Bilateral 06/08/2014   Procedure: HYSTERECTOMY ABDOMINAL/BILATERAL SALPINGECTOMY/POSSIBLE BILATERAL SALPINGO OOPHORECTOMY;  Surgeon: Daria Pastures,  MD;  Location: [facility];  Service: Gynecology;  Laterality: Bilateral;  POSSIBLE STAGING  . BILATERAL SALPINGECTOMY Right 06/08/2014   Procedure: BILATERAL SALPINGECTOMY;  Surgeon: Daria Pastures, MD;  Location: [facility];  Service: Gynecology;  Laterality: Right;  . CHOLECYSTECTOMY  2000  . OOPHORECTOMY Left 06/08/2014   Procedure: OOPHORECTOMY;  Surgeon: Daria Pastures, MD;  Location: [facility];  Service: Gynecology;  Laterality: Left;  . WISDOM TOOTH EXTRACTION      Social History   Social History  . Marital status: Single    Spouse name: N/A  . Number of children: N/A  . Years of education: N/A   Social History Main Topics  . Smoking status: Never Smoker  . Smokeless tobacco: Never Used  . Alcohol use No  . Drug use: No  . Sexual activity: Not Currently    Birth control/ protection: None   Other Topics Concern  . None   Social History Narrative  . None    Family History  Problem Relation Age of Onset  . Arthritis Mother   . Heart disease Mother   . Hypertension Mother   . Diabetes Mother   . Arthritis Father   . Heart disease Father   . Hypertension Father   . Diabetes Father   . Arthritis Maternal Grandmother   . Arthritis Maternal Grandfather   . Arthritis Paternal Grandmother   . Arthritis Paternal Grandfather     Review of Systems  Constitutional: Negative for chills and fever.  Eyes: Negative for visual disturbance.  Respiratory: Negative for cough, shortness of breath and wheezing.   Cardiovascular: Positive for leg swelling (occasional). Negative for chest pain and palpitations.  Gastrointestinal: Positive for blood in stool (with constipation) and constipation. Negative for abdominal pain.       Gerd controlled  Endocrine: Negative for polydipsia and polyuria.  Genitourinary: Positive for dysuria (chronic). Negative for frequency and hematuria.  Musculoskeletal: Positive for arthralgias and back pain.  Neurological: Negative for dizziness,  light-headedness and headaches.  Psychiatric/Behavioral: Positive for dysphoric mood (since mom passed in 2015) and sleep disturbance. The patient is nervous/anxious (some days).        Objective:   Vitals:   06/27/16 0915  BP: 130/88  Pulse: 74  Resp: 16  Temp: 98.4 F (36.9 C)   Filed Weights   06/27/16 0915  Weight: 265 lb (120.2 kg)   Body mass index is 38.02 kg/m.   Physical Exam Constitutional: She appears well-developed and well-nourished. No distress.  HENT:  Head: Normocephalic and atraumatic.  Right Ear: External ear normal. Normal ear canal and TM Left Ear: External ear normal.  Normal ear canal and TM Mouth/Throat: Oropharynx is clear and moist.  Eyes: Conjunctivae and EOM are normal.  Neck: Neck supple. No tracheal deviation present. No thyromegaly present.  No carotid bruit  Cardiovascular: Normal rate, regular rhythm and  normal heart sounds.   No murmur heard.  No edema. Pulmonary/Chest: Effort normal and breath sounds normal. No respiratory distress. She has no wheezes. She has no rales.  Abdominal: Soft. She exhibits no distension. There is no tenderness.  Lymphadenopathy: She has no cervical adenopathy.  Skin: Skin is warm and dry. She is not diaphoretic.  only in size boil right axilla that is tender-no open wound or discharge, 3 other smaller boils that are nontender  Psychiatric: She has a normal mood and affect. Her behavior is normal.         Assessment & Plan:   See Problem List for Assessment and Plan of chronic medical problems.   F/u in 3 months

## 2016-06-27 NOTE — Assessment & Plan Note (Signed)
Check A1c Work on weight loss, start regular exercise

## 2016-06-27 NOTE — Assessment & Plan Note (Signed)
Related to osteoarthritis Following with orthopedics Gel injections not effective Receiving steroid injections Will work on weight loss Start water aerobics

## 2016-06-28 LAB — URINE CULTURE

## 2016-06-29 ENCOUNTER — Encounter: Payer: Self-pay | Admitting: Internal Medicine

## 2016-07-19 MED FILL — LOSARTAN-HCTZ 100-12.5 MG T: 100-12.5 | 90 days supply | Qty: 90 | Fill #1

## 2016-08-21 DIAGNOSIS — M47816 Spondylosis without myelopathy or radiculopathy, lumbar region: Secondary | ICD-10-CM | POA: Diagnosis not present

## 2016-08-21 DIAGNOSIS — Z6838 Body mass index (BMI) 38.0-38.9, adult: Secondary | ICD-10-CM | POA: Diagnosis not present

## 2016-08-21 DIAGNOSIS — I10 Essential (primary) hypertension: Secondary | ICD-10-CM | POA: Diagnosis not present

## 2016-09-04 DIAGNOSIS — M7061 Trochanteric bursitis, right hip: Secondary | ICD-10-CM | POA: Diagnosis not present

## 2016-09-04 DIAGNOSIS — M7062 Trochanteric bursitis, left hip: Secondary | ICD-10-CM | POA: Diagnosis not present

## 2016-09-04 DIAGNOSIS — M170 Bilateral primary osteoarthritis of knee: Secondary | ICD-10-CM | POA: Diagnosis not present

## 2016-09-04 MED FILL — DICLOFENAC SOD 75 MG TAB EC: 75 | 30 days supply | Qty: 60 | Fill #0

## 2016-09-23 MED FILL — MONTELUKAST SOD 10 MG TAB: 10 | 90 days supply | Qty: 90 | Fill #1

## 2016-09-25 ENCOUNTER — Ambulatory Visit: Payer: 59 | Admitting: Internal Medicine

## 2016-10-15 DIAGNOSIS — R7303 Prediabetes: Secondary | ICD-10-CM | POA: Insufficient documentation

## 2016-10-15 NOTE — Progress Notes (Signed)
Subjective:    Patient ID: Dawn Townsend, female    DOB: [birth year], 51 y.o.   MRN: [id number]  HPI The patient is here for follow up.  Hypertension: She is taking her medication daily. She is compliant with a low sodium diet.  She denies chest pain, palpitations, edema, shortness of breath and regular headaches. She is not exercising regularly.  She does not monitor her blood pressure at home.    GERD:  She has stopped the prevacid one year ago.  She stopped the greasy foods and it has helpd.  She only has gerd episode on average every three months.    Asthma: She is taking her singulair.  She has never needed an inhaler except when she was a child.    Prediabetes:  She has not been compliant with a low sugar/carbohydrate diet.  She is not exercising regularly due to knee pain and back pain.  Dysuria:  She still has dysuria.  It is intermittent, but has been constant for over 2 years.  It was initially thought to be related to her large fibroid, but removing it did not make it go away.  She denies increased frequency.  She denies difficulty urinating. She has some urgency.  She has seen urology in the past.    Medications and allergies reviewed with patient and updated if appropriate.  Patient Active Problem List   Diagnosis Date Noted  . Prediabetes 10/15/2016  . Asthma 06/27/2016  . GERD (gastroesophageal reflux disease) 06/27/2016  . Essential hypertension, benign 06/27/2016  . Dysuria 06/27/2016  . Infected sebaceous cyst of skin 06/27/2016  . Morbid obesity ([place]) 06/27/2016  . Family history of diabetes mellitus (DM) 06/27/2016  . Eczema 06/27/2016  . Chronic lower back pain 06/27/2016  . Bilateral knee pain 06/27/2016    Current Outpatient Prescriptions on File Prior to Visit  Medication Sig Dispense Refill  . Cholecalciferol (VITAMIN D PO) Take by mouth.    . lansoprazole (PREVACID) 30 MG capsule Take 30 mg by mouth daily at 12 noon.    Marland Kitchen losartan-hydrochlorothiazide  (HYZAAR) 100-12.5 MG tablet Take 1 tablet by mouth daily.    . montelukast (SINGULAIR) 10 MG tablet Take 1 tablet (10 mg total) by mouth at bedtime. 90 tablet 3  . naproxen (NAPROSYN) 500 MG tablet Take 1 tablet (500 mg total) by mouth 2 (two) times daily. (Patient not taking: Reported on 10/16/2016) 30 tablet 0   No current facility-administered medications on file prior to visit.     Past Medical History:  Diagnosis Date  . Arthritis    knee   . Asthma    no inhaler  . Fibroids   . GERD (gastroesophageal reflux disease)   . Hyperlipidemia   . Hypertension   . SVD (spontaneous vaginal delivery)    x 1  . Tendonitis of foot     Past Surgical History:  Procedure Laterality Date  . ABDOMINAL HYSTERECTOMY Bilateral 06/08/2014   Procedure: HYSTERECTOMY ABDOMINAL/BILATERAL SALPINGECTOMY/POSSIBLE BILATERAL SALPINGO OOPHORECTOMY;  Surgeon: Daria Pastures, MD;  Location: [facility];  Service: Gynecology;  Laterality: Bilateral;  POSSIBLE STAGING  . BILATERAL SALPINGECTOMY Right 06/08/2014   Procedure: BILATERAL SALPINGECTOMY;  Surgeon: Daria Pastures, MD;  Location: [facility];  Service: Gynecology;  Laterality: Right;  . CHOLECYSTECTOMY  2000  . OOPHORECTOMY Left 06/08/2014   Procedure: OOPHORECTOMY;  Surgeon: Daria Pastures, MD;  Location: [facility];  Service: Gynecology;  Laterality: Left;  . WISDOM TOOTH EXTRACTION  Social History   Social History  . Marital status: Single    Spouse name: N/A  . Number of children: N/A  . Years of education: N/A   Social History Main Topics  . Smoking status: Never Smoker  . Smokeless tobacco: Never Used  . Alcohol use No  . Drug use: No  . Sexual activity: Not Currently    Birth control/ protection: None   Other Topics Concern  . None   Social History Narrative  . None    Family History  Problem Relation Age of Onset  . Arthritis Mother   . Heart disease Mother   . Hypertension Mother   . Diabetes Mother   . Arthritis  Father   . Heart disease Father   . Hypertension Father   . Diabetes Father   . Arthritis Maternal Grandmother   . Arthritis Maternal Grandfather   . Arthritis Paternal Grandmother   . Arthritis Paternal Grandfather     Review of Systems  Constitutional: Negative for fever.  Respiratory: Negative for cough, shortness of breath and wheezing.   Cardiovascular: Positive for leg swelling (rare). Negative for chest pain and palpitations.  Genitourinary: Positive for dysuria. Negative for difficulty urinating (no weak stream) and hematuria.  Neurological: Positive for headaches (occ). Negative for light-headedness.       Objective:   Vitals:   10/16/16 1050  BP: 126/82  Pulse: 84  Resp: 16  Temp: 98.1 F (36.7 C)   Wt Readings from Last 3 Encounters:  10/16/16 268 lb (121.6 kg)  06/27/16 265 lb (120.2 kg)  06/08/14 252 lb (114.3 kg)   Body mass index is 38.45 kg/m.   Physical Exam    Constitutional: Appears well-developed and well-nourished. No distress.  HENT:  Head: Normocephalic and atraumatic.  Neck: Neck supple. No tracheal deviation present. No thyromegaly present.  No cervical lymphadenopathy Cardiovascular: Normal rate, regular rhythm and normal heart sounds.   No murmur heard. No carotid bruit .  No edema Pulmonary/Chest: Effort normal and breath sounds normal. No respiratory distress. No has no wheezes. No rales.  Abdomen: soft, non tender Skin: Skin is warm and dry. Not diaphoretic.  Psychiatric: Normal mood and affect. Behavior is normal.      Assessment & Plan:    See Problem List for Assessment and Plan of chronic medical problems.

## 2016-10-15 NOTE — Assessment & Plan Note (Addendum)
Wt Readings from Last 3 Encounters:  10/16/16 268 lb (121.6 kg)  06/27/16 265 lb (120.2 kg)  06/08/14 252 lb (114.3 kg)   Stressed exercise - do what she is able Dec portions Healthy diet

## 2016-10-16 ENCOUNTER — Other Ambulatory Visit (INDEPENDENT_AMBULATORY_CARE_PROVIDER_SITE_OTHER): Payer: 59

## 2016-10-16 ENCOUNTER — Encounter: Payer: Self-pay | Admitting: Internal Medicine

## 2016-10-16 ENCOUNTER — Ambulatory Visit (INDEPENDENT_AMBULATORY_CARE_PROVIDER_SITE_OTHER): Payer: 59 | Admitting: Internal Medicine

## 2016-10-16 VITALS — BP 126/82 | HR 84 | Temp 98.1°F | Resp 16 | Wt 268.0 lb

## 2016-10-16 DIAGNOSIS — K219 Gastro-esophageal reflux disease without esophagitis: Secondary | ICD-10-CM | POA: Diagnosis not present

## 2016-10-16 DIAGNOSIS — J45909 Unspecified asthma, uncomplicated: Secondary | ICD-10-CM | POA: Diagnosis not present

## 2016-10-16 DIAGNOSIS — R300 Dysuria: Secondary | ICD-10-CM | POA: Diagnosis not present

## 2016-10-16 DIAGNOSIS — R7303 Prediabetes: Secondary | ICD-10-CM

## 2016-10-16 DIAGNOSIS — I10 Essential (primary) hypertension: Secondary | ICD-10-CM

## 2016-10-16 DIAGNOSIS — Z114 Encounter for screening for human immunodeficiency virus [HIV]: Secondary | ICD-10-CM

## 2016-10-16 LAB — COMPREHENSIVE METABOLIC PANEL
ALBUMIN: 3.9 g/dL (ref 3.5–5.2)
ALT: 16 U/L (ref 0–35)
AST: 13 U/L (ref 0–37)
Alkaline Phosphatase: 90 U/L (ref 39–117)
BUN: 15 mg/dL (ref 6–23)
CALCIUM: 9.2 mg/dL (ref 8.4–10.5)
CHLORIDE: 101 meq/L (ref 96–112)
CO2: 33 mEq/L — ABNORMAL HIGH (ref 19–32)
Creatinine, Ser: 0.58 mg/dL (ref 0.40–1.20)
GFR: 141.59 mL/min (ref >60.00)
Glucose, Bld: 81 mg/dL (ref 70–99)
Potassium: 4 mEq/L (ref 3.5–5.1)
SODIUM: 138 meq/L (ref 135–145)
Total Bilirubin: 0.6 mg/dL (ref 0.2–1.2)
Total Protein: 8.3 g/dL (ref 6.0–8.3)

## 2016-10-16 LAB — HEMOGLOBIN A1C: HEMOGLOBIN A1C: 5.9 % (ref 4.6–6.5)

## 2016-10-16 LAB — URINALYSIS, ROUTINE W REFLEX MICROSCOPIC
Bilirubin Urine: NEGATIVE
Hgb urine dipstick: NEGATIVE
KETONES UR: NEGATIVE
Leukocytes, UA: NEGATIVE
NITRITE: NEGATIVE
RBC / HPF: NONE SEEN (ref 0–?)
SPECIFIC GRAVITY, URINE: 1.015 (ref 1.000–1.030)
Total Protein, Urine: NEGATIVE
URINE GLUCOSE: NEGATIVE
UROBILINOGEN UA: 1 (ref 0.0–1.0)
pH: 6.5 (ref 5.0–8.0)

## 2016-10-16 NOTE — Assessment & Plan Note (Signed)
Controlled with singulair Has not needed inhalers in years

## 2016-10-16 NOTE — Assessment & Plan Note (Signed)
Check UA, UCx to rule out infection  If no infection present will refer to urology - ? Urethral pathology

## 2016-10-16 NOTE — Patient Instructions (Signed)
  Test(s) ordered today. Your results will be released to MyChart (or called to you) after review, usually within 72hours after test completion. If any changes need to be made, you will be notified at that same time.  Medications reviewed and updated.  No changes recommended at this time.    Please followup in 6 months   

## 2016-10-16 NOTE — Assessment & Plan Note (Signed)
BP well controlled Current regimen effective and well tolerated Continue current medications at current doses cmp  

## 2016-10-16 NOTE — Progress Notes (Signed)
Pre visit review using our clinic review tool, if applicable. No additional management support is needed unless otherwise documented below in the visit note. 

## 2016-10-16 NOTE — Assessment & Plan Note (Signed)
Check a1c Low sugar / carb diet Stressed regular exercise, keeping weight down/weight loss 

## 2016-10-16 NOTE — Assessment & Plan Note (Signed)
Controlled off medication Continue as needed medication

## 2016-10-17 ENCOUNTER — Other Ambulatory Visit: Payer: Self-pay | Admitting: Internal Medicine

## 2016-10-17 ENCOUNTER — Encounter: Payer: Self-pay | Admitting: Internal Medicine

## 2016-10-17 DIAGNOSIS — R300 Dysuria: Secondary | ICD-10-CM

## 2016-10-17 LAB — URINE CULTURE: ORGANISM ID, BACTERIA: NO GROWTH

## 2016-10-17 LAB — HIV ANTIBODY (ROUTINE TESTING W REFLEX): HIV 1&2 Ab, 4th Generation: NONREACTIVE

## 2016-10-17 NOTE — Assessment & Plan Note (Signed)
Urinalysis and urine culture showed no evidence of infection Referred to urology

## 2016-10-22 ENCOUNTER — Other Ambulatory Visit: Payer: Self-pay | Admitting: *Deleted

## 2016-10-22 MED ORDER — LOSARTAN POTASSIUM-HCTZ 100-12.5 MG PO TABS: 1.0000 | ORAL_TABLET | Freq: Every day | ORAL | 1 refills | Status: DC

## 2016-10-22 MED FILL — LOSARTAN-HCTZ 100-12.5 MG T: 100-12.5 | 90 days supply | Qty: 90 | Fill #0

## 2016-10-23 MED FILL — DICLOFENAC SOD 75 MG TAB EC: 75 | 30 days supply | Qty: 60 | Fill #0

## 2016-11-28 DIAGNOSIS — M25571 Pain in right ankle and joints of right foot: Secondary | ICD-10-CM | POA: Diagnosis not present

## 2016-12-20 MED FILL — MONTELUKAST SOD 10 MG TAB: 10 | 90 days supply | Qty: 90 | Fill #2

## 2016-12-20 MED FILL — DICLOFENAC SOD 75 MG TAB EC: 75 | 30 days supply | Qty: 60 | Fill #1

## 2016-12-25 DIAGNOSIS — R351 Nocturia: Secondary | ICD-10-CM | POA: Diagnosis not present

## 2016-12-25 DIAGNOSIS — R300 Dysuria: Secondary | ICD-10-CM | POA: Diagnosis not present

## 2016-12-25 DIAGNOSIS — N952 Postmenopausal atrophic vaginitis: Secondary | ICD-10-CM | POA: Diagnosis not present

## 2017-01-15 IMAGING — CR DG LUMBAR SPINE COMPLETE 4+V
5 series · 5 of 5 positions shown · non-contrast
Comparison: 10/28/2014

CLINICAL DATA: Sharp lower back pain.  No history of injury.

EXAM:
LUMBAR SPINE - COMPLETE 4+ VIEW

[t lumbar spine ap]
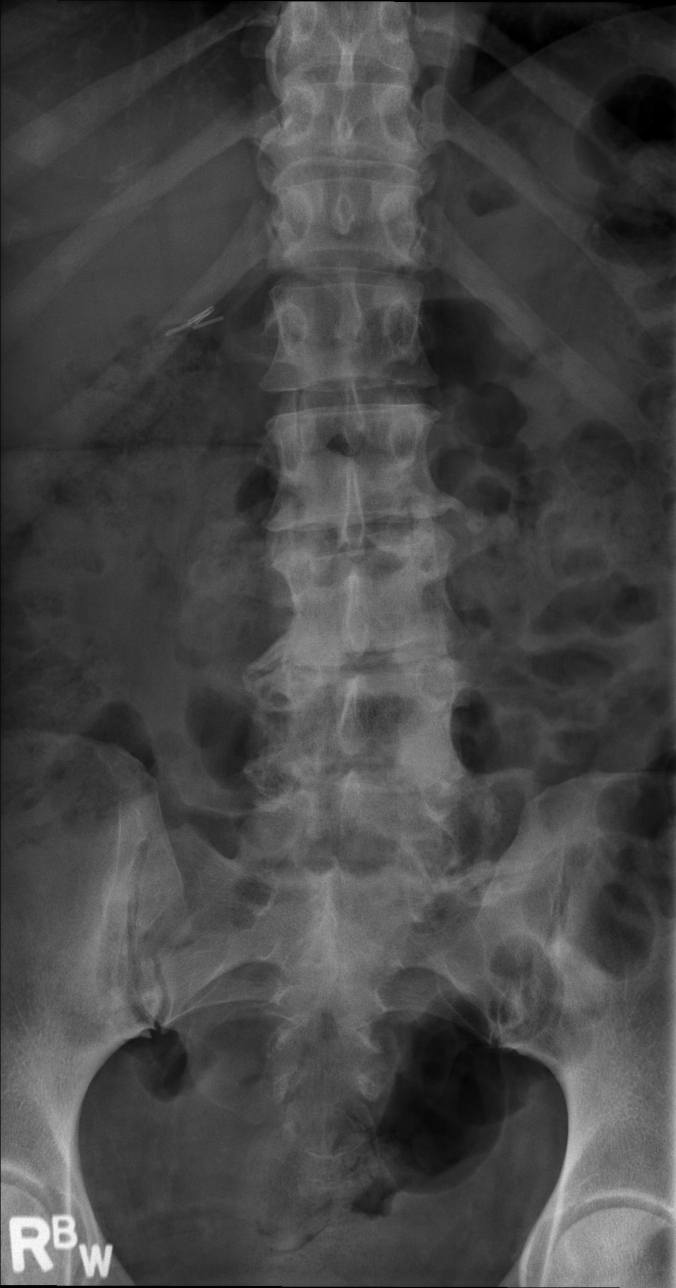

[t lumbar spine obl (1 of 2)]
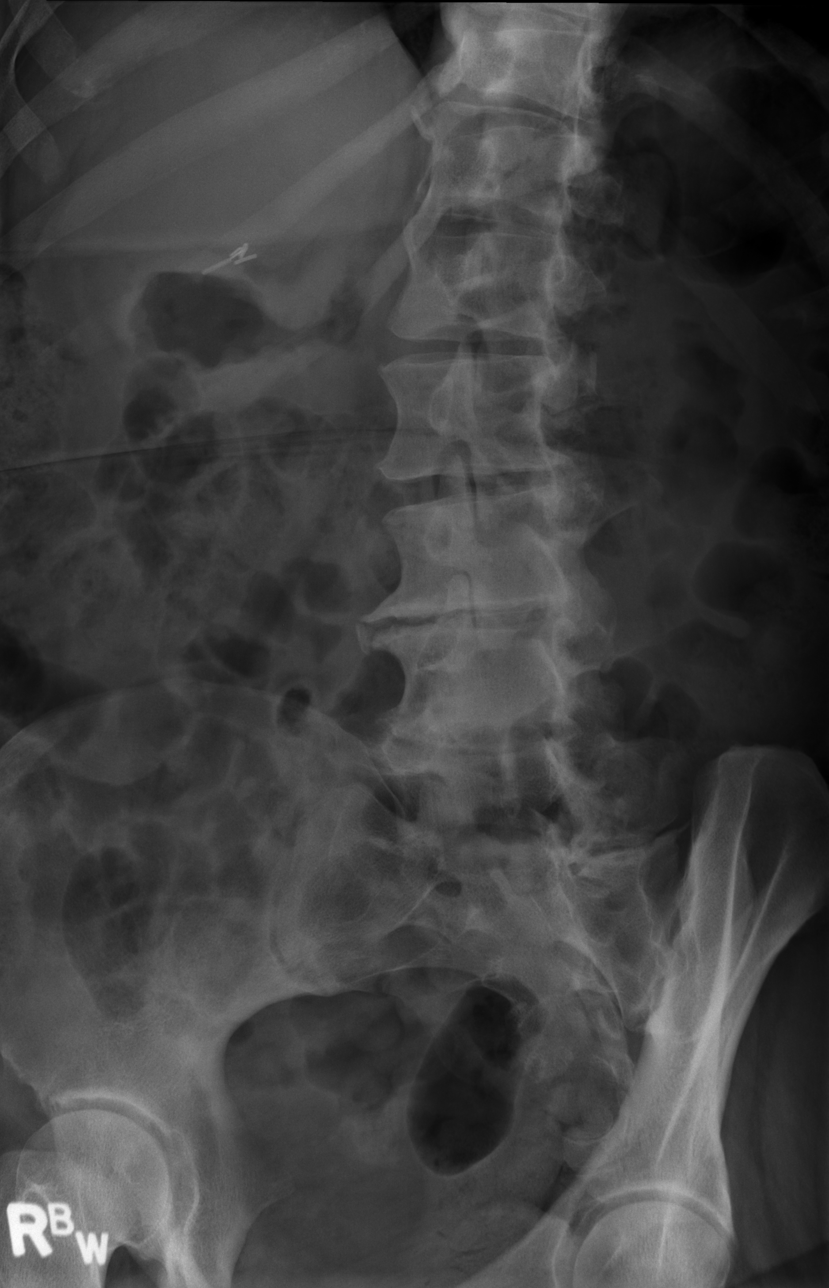

[t lumbar spine obl (2 of 2)]
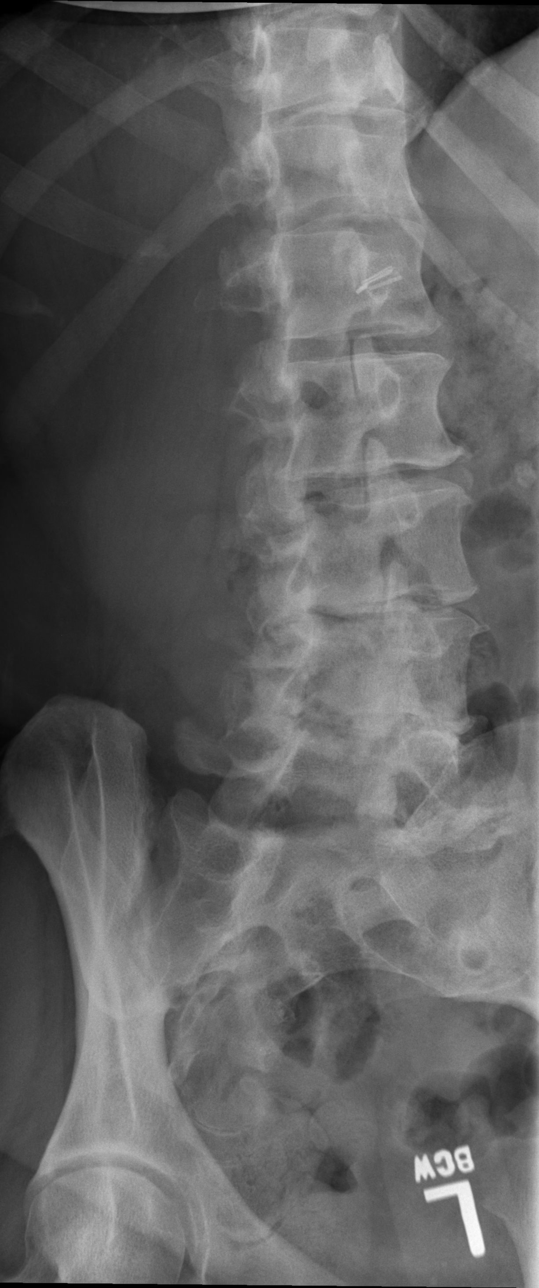

[t lumbar spine lat]
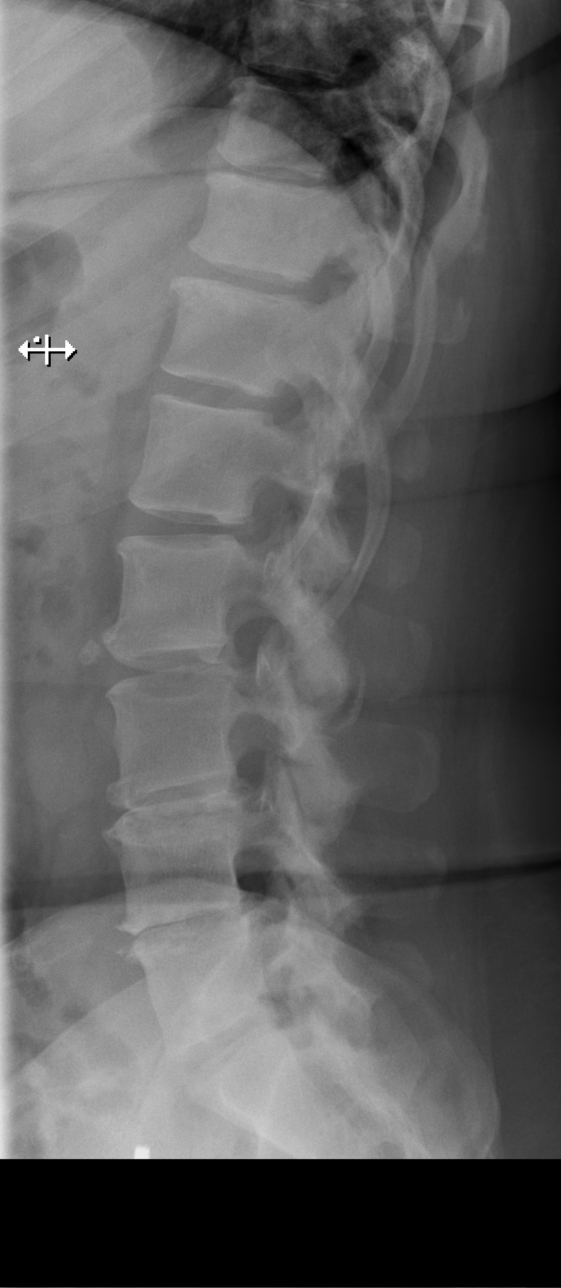

[t lumbar l-5 s-1 spot]
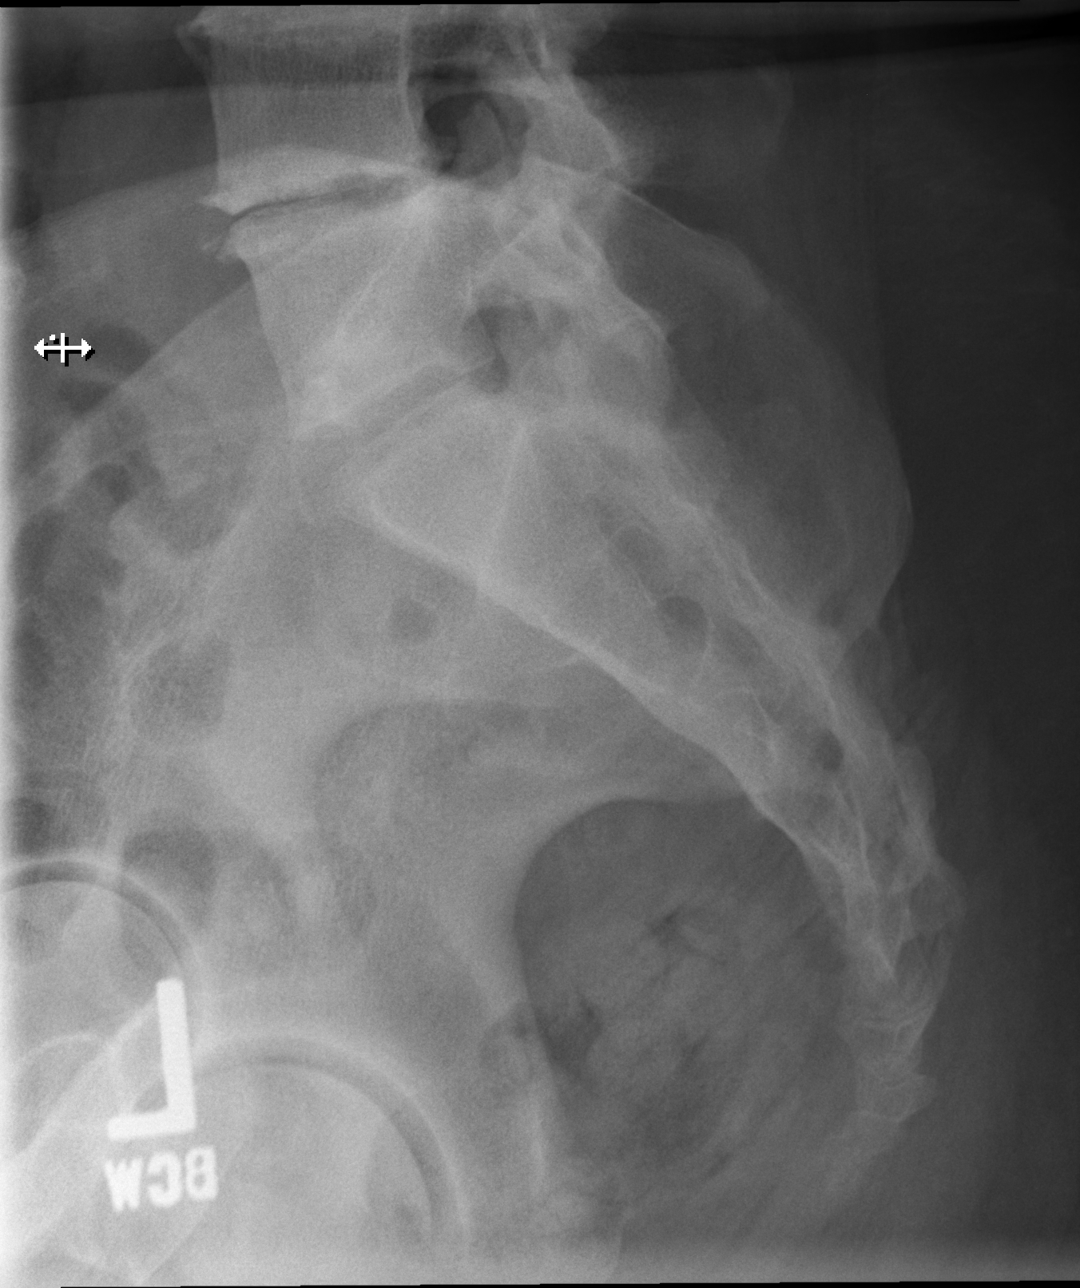

[5 of 5 positions shown; findings below may reference images not displayed]

FINDINGS: There are 5 non rib-bearing vertebral bodies. The vertebral body
heights and alignment are normal. There are multilevel
osteoarthritic changes, worse in the lower lumbosacral spine. Most
pronounced disc space narrowing, endplate sclerosis and mild
remodeling of the vertebral bodies is seen at L3-L4 and L4-L5. Disc
osteophyte complexes also suspected at these levels given anterior
and posterior osteophyte formation. Posterior facet arthropathy is
also seen in the lower lumbosacral spine. There is narrowing of
bilateral bony neural foramina at L3-L4, L4-L5 and L5-S1.
Cholecystectomy clips are noted. Soft tissues are grossly normal
otherwise.
IMPRESSION: Moderate to advanced osteoarthritic changes with lower lumbosacral
spine predominance, with subsequent narrowing of the bilateral
neural foramina.

## 2017-01-27 MED FILL — LOSARTAN-HCTZ 100-12.5 MG T: 100-12.5 | 90 days supply | Qty: 90 | Fill #1

## 2017-01-27 MED FILL — DICLOFENAC SOD 75 MG TAB EC: 75 | 30 days supply | Qty: 60 | Fill #2

## 2017-03-17 MED FILL — DICLOFENAC SOD 75 MG TAB EC: 75 | 30 days supply | Qty: 60 | Fill #3

## 2017-03-17 MED FILL — MONTELUKAST SOD 10 MG TAB: 10 | 90 days supply | Qty: 90 | Fill #3

## 2017-04-15 ENCOUNTER — Ambulatory Visit: Payer: 59 | Admitting: Internal Medicine

## 2017-04-16 ENCOUNTER — Other Ambulatory Visit (INDEPENDENT_AMBULATORY_CARE_PROVIDER_SITE_OTHER): Payer: 59

## 2017-04-16 ENCOUNTER — Encounter: Payer: Self-pay | Admitting: Internal Medicine

## 2017-04-16 ENCOUNTER — Ambulatory Visit (INDEPENDENT_AMBULATORY_CARE_PROVIDER_SITE_OTHER): Payer: 59 | Admitting: Internal Medicine

## 2017-04-16 VITALS — BP 130/82 | HR 85 | Temp 98.4°F | Resp 16 | Wt 274.0 lb

## 2017-04-16 DIAGNOSIS — R7303 Prediabetes: Secondary | ICD-10-CM

## 2017-04-16 DIAGNOSIS — G4709 Other insomnia: Secondary | ICD-10-CM

## 2017-04-16 DIAGNOSIS — G4700 Insomnia, unspecified: Secondary | ICD-10-CM | POA: Insufficient documentation

## 2017-04-16 DIAGNOSIS — I10 Essential (primary) hypertension: Secondary | ICD-10-CM | POA: Diagnosis not present

## 2017-04-16 DIAGNOSIS — K219 Gastro-esophageal reflux disease without esophagitis: Secondary | ICD-10-CM

## 2017-04-16 LAB — COMPREHENSIVE METABOLIC PANEL
ALBUMIN: 3.5 g/dL (ref 3.5–5.2)
ALK PHOS: 77 U/L (ref 39–117)
ALT: 14 U/L (ref 0–35)
AST: 15 U/L (ref 0–37)
BILIRUBIN TOTAL: 0.3 mg/dL (ref 0.2–1.2)
BUN: 15 mg/dL (ref 6–23)
CALCIUM: 8.8 mg/dL (ref 8.4–10.5)
CO2: 32 mEq/L (ref 19–32)
Chloride: 100 mEq/L (ref 96–112)
Creatinine, Ser: 0.66 mg/dL (ref 0.40–1.20)
GFR: 121.73 mL/min (ref >60.00)
Glucose, Bld: 98 mg/dL (ref 70–99)
POTASSIUM: 3.9 meq/L (ref 3.5–5.1)
Sodium: 137 mEq/L (ref 135–145)
TOTAL PROTEIN: 7.3 g/dL (ref 6.0–8.3)

## 2017-04-16 LAB — HEMOGLOBIN A1C: Hgb A1c MFr Bld: 5.9 % (ref 4.6–6.5)

## 2017-04-16 MED ORDER — LANSOPRAZOLE 30 MG PO CPDR: 30.0000 mg | DELAYED_RELEASE_CAPSULE | Freq: Every day | ORAL | 1 refills | Status: DC

## 2017-04-16 MED FILL — LANSOPRAZOLE DR 30 MG CAP: 30 | 90 days supply | Qty: 90 | Fill #0

## 2017-04-16 NOTE — Progress Notes (Signed)
Subjective:    Patient ID: Dawn Townsend, female    DOB: [birth year], 51 y.o.   MRN: [id number]  HPI The patient is here for follow up.  Hypertension: She is taking her medication daily. She is more compliant with a low sodium diet, but could probably do better.  She denies chest pain, frequent shortness of breath and regular headaches. She is not exercising regularly.  She does not monitor her blood pressure at home.    Prediabetes:  She is more compliant with a low sugar/carbohydrate diet. she is drinking a lot less Coke and Juice.  She is not exercising regularly.  GERD:  She has started having GERD.   She was on prevacid 30 mg daily in the past and it worked well. She wondered if she could restart it.  She is trying to make changes in her diet.  She denies abdominal pain.    Sleeping difficulty:  She is having some difficulty sleeping.  She sleeps for 3-4 hrs and can not get back to sleep.  It has gotten worse recently, but has been a problem for several years.  She thinks she may have some depression - she really misses her mom.   She is so tired sometimes she has difficulty functioning.  After few days of not sleeping she will sleep well.   Medications and allergies reviewed with patient and updated if appropriate.  Patient Active Problem List   Diagnosis Date Noted  . 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 Prediabetes 10/15/2016  . Asthma 06/27/2016  . GERD (gastroesophageal reflux disease) 06/27/2016  . Essential hypertension, benign 06/27/2016  . Dysuria 06/27/2016  . Morbid obesity ([place]) 06/27/2016  . Family history of diabetes mellitus (DM) 06/27/2016  . Eczema 06/27/2016  . Chronic lower back pain 06/27/2016  . Bilateral knee pain 06/27/2016    Current Outpatient Prescriptions on File Prior to Visit  Medication Sig Dispense Refill  . Cholecalciferol (VITAMIN D PO) Take by mouth.    . diclofenac (VOLTAREN) 75 MG EC tablet   0  . losartan-hydrochlorothiazide (HYZAAR) 100-12.5  MG tablet Take 1 tablet by mouth daily. 90 tablet 1  . montelukast (SINGULAIR) 10 MG tablet Take 1 tablet (10 mg total) by mouth at bedtime. 90 tablet 3   No current facility-administered medications on file prior to visit.     Past Medical History:  Diagnosis Date  . Arthritis    knee   . Asthma    no inhaler  . Fibroids   . GERD (gastroesophageal reflux disease)   . Hyperlipidemia   . Hypertension   . SVD (spontaneous vaginal delivery)    x 1  . Tendonitis of foot     Past Surgical History:  Procedure Laterality Date  . ABDOMINAL HYSTERECTOMY Bilateral 06/08/2014   Procedure: HYSTERECTOMY ABDOMINAL/BILATERAL SALPINGECTOMY/POSSIBLE BILATERAL SALPINGO OOPHORECTOMY;  Surgeon: Daria Pastures, MD;  Location: [facility];  Service: Gynecology;  Laterality: Bilateral;  POSSIBLE STAGING  . BILATERAL SALPINGECTOMY Right 06/08/2014   Procedure: BILATERAL SALPINGECTOMY;  Surgeon: Daria Pastures, MD;  Location: [facility];  Service: Gynecology;  Laterality: Right;  . CHOLECYSTECTOMY  2000  . OOPHORECTOMY Left 06/08/2014   Procedure: OOPHORECTOMY;  Surgeon: Daria Pastures, MD;  Location: [facility];  Service: Gynecology;  Laterality: Left;  . WISDOM TOOTH EXTRACTION      Social History   Social History  . Marital status: Single    Spouse name: N/A  . Number of children: N/A  . Years  of education: N/A   Social History Main Topics  . Smoking status: Never Smoker  . Smokeless tobacco: Never Used  . Alcohol use No  . Drug use: No  . Sexual activity: Not Currently    Birth control/ protection: None   Other Topics Concern  . Not on file   Social History Narrative  . No narrative on file    Family History  Problem Relation Age of Onset  . Arthritis Mother   . Heart disease Mother   . Hypertension Mother   . Diabetes Mother   . Arthritis Father   . Heart disease Father   . Hypertension Father   . Diabetes Father   . Arthritis Maternal Grandmother   . Arthritis Maternal  Grandfather   . Arthritis Paternal Grandmother   . Arthritis Paternal Grandfather     Review of Systems  Constitutional: Negative for chills and fever.  Respiratory: Positive for shortness of breath (two episodes - with it being very hot/humid - related to hot). Negative for cough and wheezing.   Cardiovascular: Positive for palpitations (rare) and leg swelling (intermittent). Negative for chest pain.  Gastrointestinal: Negative for abdominal pain.  Neurological: Positive for headaches. Negative for light-headedness.  Psychiatric/Behavioral: Positive for dysphoric mood and sleep disturbance. The patient is nervous/anxious.        Objective:   Vitals:   04/16/17 1400  BP: 130/82  Pulse: 85  Resp: 16  Temp: 98.4 F (36.9 C)   Wt Readings from Last 3 Encounters:  04/16/17 274 lb (124.3 kg)  10/16/16 268 lb (121.6 kg)  06/27/16 265 lb (120.2 kg)   Body mass index is 39.31 kg/m.   Physical Exam    Constitutional: Appears well-developed and well-nourished. No distress.  HENT:  Head: Normocephalic and atraumatic.  Neck: Neck supple. No tracheal deviation present. No thyromegaly present.  No cervical lymphadenopathy Cardiovascular: Normal rate, regular rhythm and normal heart sounds.   No murmur heard. No carotid bruit .  No edema Pulmonary/Chest: Effort normal and breath sounds normal. No respiratory distress. No has no wheezes. No rales.  Skin: Skin is warm and dry. Not diaphoretic.  Psychiatric: Normal mood and affect. Behavior is normal.      Assessment & Plan:    See Problem List for Assessment and Plan of chronic medical problems.

## 2017-04-16 NOTE — Assessment & Plan Note (Signed)
Not sleeping well - somewhat chronic, but has worsened Related to some stress, depression and possible primary sleep d/o She would like to try something natural first - advised to try melatonin first Work on stress management, depression in natural ways If that is not helpful will try remeron

## 2017-04-16 NOTE — Assessment & Plan Note (Addendum)
BP well controlled Current regimen effective and well tolerated Continue current medications at current doses cmp  

## 2017-04-16 NOTE — Assessment & Plan Note (Signed)
Continue to work on diet changes - increasing water, decreasing coke and juice Be as active as possible Will stress regular exercise in future, but she is limited by chronic back and knee pain -- stress small changes in her diet first Follow up at next visit

## 2017-04-16 NOTE — Assessment & Plan Note (Signed)
Check a1c Low sugar / carb diet Stressed regular exercise, weight loss  

## 2017-04-16 NOTE — Assessment & Plan Note (Signed)
Having increased symptoms - did wean herself off the prevacid Will restart prevacid - discussed diet changes Goal is to wean herself back off prevacid once GERD is controlled

## 2017-04-16 NOTE — Patient Instructions (Addendum)
Try melatonin for your sleep - take 2-6 mg at night for sleep.   If this does not help let me know and we will try a prescription medication.    Test(s) ordered today. Your results will be released to [facility] (or called to you) after review, usually within 72hours after test completion. If any changes need to be made, you will be notified at that same time.  All other Health Maintenance issues reviewed.   All recommended immunizations and age-appropriate screenings are up-to-date or discussed.  No immunizations administered today.   Medications reviewed and updated.  Changes include restarting lansoprazole for your heartburn.   Your prescription(s) have been submitted to your pharmacy. Please take as directed and contact our office if you believe you are having problem(s) with the medication(s).   Please followup in 6 months, sooner if needed

## 2017-04-21 MED FILL — DICLOFENAC SOD 75 MG TAB EC: 75 | 30 days supply | Qty: 60 | Fill #4

## 2017-05-09 ENCOUNTER — Other Ambulatory Visit: Payer: Self-pay | Admitting: Internal Medicine

## 2017-05-12 MED FILL — LOSARTAN-HCTZ 100-12.5 MG T: 100-12.5 | 90 days supply | Qty: 90 | Fill #0

## 2017-05-29 MED FILL — DICLOFENAC SOD 75 MG TAB EC: 75 | 30 days supply | Qty: 60 | Fill #5

## 2017-06-25 DIAGNOSIS — M25562 Pain in left knee: Secondary | ICD-10-CM | POA: Diagnosis not present

## 2017-06-25 DIAGNOSIS — M25561 Pain in right knee: Secondary | ICD-10-CM | POA: Diagnosis not present

## 2017-07-09 ENCOUNTER — Other Ambulatory Visit: Payer: Self-pay | Admitting: Emergency Medicine

## 2017-07-09 MED ORDER — MONTELUKAST SODIUM 10 MG PO TABS: 10.0000 mg | ORAL_TABLET | Freq: Every day | ORAL | 1 refills | Status: DC

## 2017-07-09 MED FILL — HYDROCODON-APAP 5-325: 5-325 | 5 days supply | Qty: 40 | Fill #0

## 2017-07-09 MED FILL — MONTELUKAST SOD 10 MG TAB: 10 | 90 days supply | Qty: 90 | Fill #0

## 2017-07-09 MED FILL — DICLOFENAC SOD 75 MG TAB EC: 75 | 30 days supply | Qty: 60 | Fill #0

## 2017-07-24 MED FILL — LANSOPRAZOLE DR 30 MG CAP: 30 | 90 days supply | Qty: 90 | Fill #1

## 2017-08-18 MED FILL — LOSARTAN-HCTZ 100-12.5 MG T: 100-12.5 | 90 days supply | Qty: 90 | Fill #1

## 2017-08-18 MED FILL — DICLOFENAC SODIUM 75 MG TAB: 75 | 30 days supply | Qty: 60 | Fill #1

## 2017-09-29 MED FILL — DICLOFENAC SODIUM 75 MG TAB: 75 | 30 days supply | Qty: 60 | Fill #2

## 2017-10-15 DIAGNOSIS — M25562 Pain in left knee: Secondary | ICD-10-CM | POA: Diagnosis not present

## 2017-10-15 DIAGNOSIS — M25561 Pain in right knee: Secondary | ICD-10-CM | POA: Diagnosis not present

## 2017-10-22 ENCOUNTER — Ambulatory Visit: Payer: 59 | Admitting: Internal Medicine

## 2017-10-27 ENCOUNTER — Other Ambulatory Visit: Payer: Self-pay | Admitting: Internal Medicine

## 2017-10-27 MED FILL — LANSOPRAZOLE DR 30 MG CAP: 30 | 90 days supply | Qty: 90 | Fill #0

## 2017-10-29 MED FILL — MONTELUKAST SOD 10 MG TAB: 10 | 90 days supply | Qty: 90 | Fill #1

## 2017-11-05 MED FILL — DICLOFENAC SODIUM 75 MG TAB: 75 | 30 days supply | Qty: 60 | Fill #3

## 2017-11-11 NOTE — Progress Notes (Signed)
Subjective:    Patient ID: Dawn Townsend, female    DOB: May 09, 1966, 52 y.o.   MRN: [id number]  HPI The patient is here for follow up.  GERD:  She does not feel well.  She has some nausea.  She wonders if her GERD is not controlled.   When her GERD was really bad and she was started back on her current medication she was very nauseous and it improved with the medication.  She sometimes feels heartburn.  She is taking her medication daily as prescribed.  Hypertension: She is taking her medication daily. She is compliant with a low sodium diet.  She denies chest pain, palpitations, edema, shortness of breath and regular headaches. She is exercising - walking.      Prediabetes:  She is compliant with a low sugar/carbohydrate diet.  She is exercising some - some walking.  Asthma: she takes singulair daily.  This controls her asthma and she has not needed an inhaler.    Medications and allergies reviewed with patient and updated if appropriate.  Patient Active Problem List   Diagnosis Date Noted  . 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 Prediabetes 10/15/2016  . Asthma 06/27/2016  . GERD (gastroesophageal reflux disease) 06/27/2016  . Essential hypertension, benign 06/27/2016  . Morbid obesity ([place]) 06/27/2016  . Family history of diabetes mellitus (DM) 06/27/2016  . Eczema 06/27/2016  . Chronic lower back pain 06/27/2016  . Bilateral knee pain 06/27/2016    Current Outpatient Medications on File Prior to Visit  Medication Sig Dispense Refill  . Cholecalciferol (VITAMIN D PO) Take by mouth.    . diclofenac (VOLTAREN) 75 MG EC tablet   0   No current facility-administered medications on file prior to visit.     Past Medical History:  Diagnosis Date  . Arthritis    knee   . Asthma    no inhaler  . Fibroids   . GERD (gastroesophageal reflux disease)   . Hyperlipidemia   . Hypertension   . SVD (spontaneous vaginal delivery)    x 1  . Tendonitis of foot     Past Surgical  History:  Procedure Laterality Date  . ABDOMINAL HYSTERECTOMY Bilateral 06/08/2014   Procedure: HYSTERECTOMY ABDOMINAL/BILATERAL SALPINGECTOMY/POSSIBLE BILATERAL SALPINGO OOPHORECTOMY;  Surgeon: Daria Pastures, MD;  Location: [facility];  Service: Gynecology;  Laterality: Bilateral;  POSSIBLE STAGING  . BILATERAL SALPINGECTOMY Right 06/08/2014   Procedure: BILATERAL SALPINGECTOMY;  Surgeon: Daria Pastures, MD;  Location: [facility];  Service: Gynecology;  Laterality: Right;  . CHOLECYSTECTOMY  2000  . OOPHORECTOMY Left 06/08/2014   Procedure: OOPHORECTOMY;  Surgeon: Daria Pastures, MD;  Location: [facility];  Service: Gynecology;  Laterality: Left;  . WISDOM TOOTH EXTRACTION      Social History   Socioeconomic History  . Marital status: Single    Spouse name: None  . Number of children: None  . Years of education: None  . Highest education level: None  Social Needs  . Financial resource strain: None  . Food insecurity - worry: None  . Food insecurity - inability: None  . Transportation needs - medical: None  . Transportation needs - non-medical: None  Occupational History  . None  Tobacco Use  . Smoking status: Never Smoker  . Smokeless tobacco: Never Used  Substance and Sexual Activity  . Alcohol use: No  . Drug use: No  . Sexual activity: Not Currently    Birth control/protection: None  Other Topics Concern  .  None  Social History Narrative  . None    Family History  Problem Relation Age of Onset  . Arthritis Mother   . Heart disease Mother   . Hypertension Mother   . Diabetes Mother   . Arthritis Father   . Heart disease Father   . Hypertension Father   . Diabetes Father   . Arthritis Maternal Grandmother   . Arthritis Maternal Grandfather   . Arthritis Paternal Grandmother   . Arthritis Paternal Grandfather     Review of Systems  Constitutional: Negative for chills and fever.  Respiratory: Positive for wheezing (minimal, occ). Negative for cough and  shortness of breath.   Cardiovascular: Negative for chest pain, palpitations and leg swelling.  Gastrointestinal: Positive for nausea.       GERD intermittently  Neurological: Positive for headaches (intermittent, related to stress). Negative for light-headedness.       Objective:   Vitals:   11/12/17 0957  BP: 124/76  Pulse: 87  Resp: 16  Temp: 98.7 F (37.1 C)  SpO2: 97%   Wt Readings from Last 3 Encounters:  11/12/17 274 lb (124.3 kg)  04/16/17 274 lb (124.3 kg)  10/16/16 268 lb (121.6 kg)   Body mass index is 39.31 kg/m.   Physical Exam    Constitutional: Appears well-developed and well-nourished. No distress.  HENT:  Head: Normocephalic and atraumatic.  Neck: Neck supple. No tracheal deviation present. No thyromegaly present.  No cervical lymphadenopathy Cardiovascular: Normal rate, regular rhythm and normal heart sounds.   No murmur heard. No carotid bruit .  No edema Pulmonary/Chest: Effort normal and breath sounds normal. No respiratory distress. No has no wheezes. No rales.  Skin: Skin is warm and dry. Not diaphoretic.  Psychiatric: Normal mood and affect. Behavior is normal.      Assessment & Plan:    See Problem List for Assessment and Plan of chronic medical problems.

## 2017-11-11 NOTE — Patient Instructions (Addendum)
  Test(s) ordered today. Your results will be released to [facility] (or called to you) after review, usually within 72hours after test completion. If any changes need to be made, you will be notified at that same time.   Medications reviewed and updated.  Changes include adjusting your heartburn medication.   Let me know if your heartburn is not better controlled.   Your prescription(s) have been submitted to your pharmacy. Please take as directed and contact our office if you believe you are having problem(s) with the medication(s).   Please followup in 6 months

## 2017-11-12 ENCOUNTER — Ambulatory Visit: Payer: 59 | Admitting: Internal Medicine

## 2017-11-12 ENCOUNTER — Encounter: Payer: Self-pay | Admitting: Internal Medicine

## 2017-11-12 VITALS — BP 124/76 | HR 87 | Temp 98.7°F | Resp 16 | Wt 274.0 lb

## 2017-11-12 DIAGNOSIS — K219 Gastro-esophageal reflux disease without esophagitis: Secondary | ICD-10-CM

## 2017-11-12 DIAGNOSIS — I10 Essential (primary) hypertension: Secondary | ICD-10-CM | POA: Diagnosis not present

## 2017-11-12 DIAGNOSIS — R7303 Prediabetes: Secondary | ICD-10-CM | POA: Diagnosis not present

## 2017-11-12 DIAGNOSIS — J45909 Unspecified asthma, uncomplicated: Secondary | ICD-10-CM

## 2017-11-12 MED ORDER — MONTELUKAST SODIUM 10 MG PO TABS: 10.0000 mg | ORAL_TABLET | Freq: Every day | ORAL | 1 refills | Status: DC

## 2017-11-12 MED ORDER — LOSARTAN POTASSIUM-HCTZ 100-12.5 MG PO TABS: 1.0000 | ORAL_TABLET | Freq: Every day | ORAL | 1 refills | Status: DC

## 2017-11-12 MED ORDER — LANSOPRAZOLE 30 MG PO CPDR: 30.0000 mg | DELAYED_RELEASE_CAPSULE | Freq: Two times a day (BID) | ORAL | 1 refills | Status: DC

## 2017-11-12 NOTE — Assessment & Plan Note (Signed)
Check a1c Low sugar / carb diet Stressed regular exercise, weight loss  

## 2017-11-12 NOTE — Assessment & Plan Note (Signed)
Taking singulair daily Asthma controlled Has not needed an inhaler

## 2017-11-12 NOTE — Assessment & Plan Note (Signed)
BP well controlled Current regimen effective and well tolerated Continue current medications at current doses cmp  

## 2017-11-12 NOTE — Assessment & Plan Note (Addendum)
Not controlled Will try prevacid BIDAC if covered - if not will need to try prevacid in the morning and zantac 300 mg at night - if not effective with try dexilant 30 mg

## 2017-11-28 MED FILL — LOSARTAN-HCTZ 100-12.5 MG T: 100-12.5 | 90 days supply | Qty: 90 | Fill #0

## 2017-12-02 DIAGNOSIS — H524 Presbyopia: Secondary | ICD-10-CM | POA: Diagnosis not present

## 2017-12-02 DIAGNOSIS — H04123 Dry eye syndrome of bilateral lacrimal glands: Secondary | ICD-10-CM | POA: Diagnosis not present

## 2017-12-10 MED FILL — DICLOFENAC SODIUM 75 MG TAB: 75 | 30 days supply | Qty: 60 | Fill #4

## 2018-01-08 MED FILL — DICLOFENAC SODIUM 75 MG TAB: 75 | 30 days supply | Qty: 60 | Fill #5

## 2018-02-11 MED FILL — MONTELUKAST SOD 10 MG TAB: 10 | 90 days supply | Qty: 90 | Fill #0

## 2018-02-11 MED FILL — LANSOPRAZOLE DR 30 MG CAP: 30 | 90 days supply | Qty: 90 | Fill #1

## 2018-02-11 MED FILL — DICLOFENAC SODIUM 75 MG TAB: 75 | 30 days supply | Qty: 60 | Fill #0

## 2018-02-13 ENCOUNTER — Telehealth: Payer: Self-pay | Admitting: Emergency Medicine

## 2018-02-13 NOTE — Telephone Encounter (Signed)
Patient is returning [place] phone call.

## 2018-02-13 NOTE — Telephone Encounter (Signed)
Will you please schedule for June 14th at 330pm, okay with [facility].

## 2018-02-13 NOTE — Telephone Encounter (Signed)
done

## 2018-02-13 NOTE — Telephone Encounter (Signed)
Copied from [place] [id number]. Topic: Appointment Scheduling - Scheduling Inquiry for Clinic >> Feb 13, 2018 10:17 AM Dawn Townsend, NT wrote: Reason for CRM: patient is calling and is needing a physical before July 3rd due to insurance plan with [facility]. She would like to get it done sometime there week of June 17th as she is off work that whole week. There is nothing available before July 3rd for me to schedule with Dr. Quay Burow. Please contact pt to schedule.  >> Feb 13, 2018 10:33 AM [place], Isidoro Donning wrote: Any where we can fit her in?

## 2018-02-13 NOTE — Telephone Encounter (Signed)
LVM with pt. Pt is to call back and ask to speak with Lovena Le to schedule appt.

## 2018-02-26 NOTE — Patient Instructions (Addendum)
Work on [employer] loss  Test(s) ordered today. Your results will be released to [place] (or called to you) after review, usually within 72hours after test completion. If any changes need to be made, you will be notified at that same time.  All other Health Maintenance issues reviewed.   All recommended immunizations and age-appropriate screenings are up-to-date or discussed.  No immunizations administered today.   Medications reviewed and updated.  Changes include start zantac at night and remeron at night.    Your prescription(s) have been submitted to your pharmacy. Please take as directed and contact our office if you believe you are having problem(s) with the medication(s).   Please followup in 6 weeks   Health Maintenance, Female Adopting a healthy lifestyle and getting preventive care can go a long way to promote health and wellness. Talk with your health care provider about what schedule of regular examinations is right for you. This is a good chance for you to check in with your provider about disease prevention and staying healthy. In between checkups, there are plenty of things you can do on your own. Experts have done a lot of research about which lifestyle changes and preventive measures are most likely to keep you healthy. Ask your health care provider for more information. Weight and diet Eat a healthy diet  Be sure to include plenty of vegetables, fruits, low-fat dairy products, and lean protein.  Do not eat a lot of foods high in solid fats, added sugars, or salt.  Get regular exercise. This is one of the most important things you can do for your health. ? Most adults should exercise for at least 150 minutes each week. The exercise should increase your heart rate and make you sweat (moderate-intensity exercise). ? Most adults should also do strengthening exercises at least twice a week. This is in addition to the moderate-intensity exercise.  Maintain a healthy  weight  Body mass index (BMI) is a measurement that can be used to identify possible weight problems. It estimates body fat based on height and weight. Your health care provider can help determine your BMI and help you achieve or maintain a healthy weight.  For females 56 years of age and older: ? A BMI below 18.5 is considered underweight. ? A BMI of 18.5 to 24.9 is normal. ? A BMI of 25 to 29.9 is considered overweight. ? A BMI of 30 and above is considered obese.  Watch levels of cholesterol and blood lipids  You should start having your blood tested for lipids and cholesterol at 51 years of age, then have this test every 5 years.  You may need to have your cholesterol levels checked more often if: ? Your lipid or cholesterol levels are high. ? You are older than 51 years of age. ? You are at high risk for heart disease.  Cancer screening Lung Cancer  Lung cancer screening is recommended for adults 40-56 years old who are at high risk for lung cancer because of a history of smoking.  A yearly low-dose CT scan of the lungs is recommended for people who: ? Currently smoke. ? Have quit within the past 15 years. ? Have at least a 30-pack-year history of smoking. A pack year is smoking an average of one pack of cigarettes a day for 1 year.  Yearly screening should continue until it has been 15 years since you quit.  Yearly screening should stop if you develop a health problem that would prevent you from  having lung cancer treatment.  Breast Cancer  Practice breast self-awareness. This means understanding how your breasts normally appear and feel.  It also means doing regular breast self-exams. Let your health care provider know about any changes, no matter how small.  If you are in your 20s or 30s, you should have a clinical breast exam (CBE) by a health care provider every 1-3 years as part of a regular health exam.  If you are 12 or older, have a CBE every year. Also consider  having a breast X-ray (mammogram) every year.  If you have a family history of breast cancer, talk to your health care provider about genetic screening.  If you are at high risk for breast cancer, talk to your health care provider about having an MRI and a mammogram every year.  Breast cancer gene (BRCA) assessment is recommended for women who have family members with BRCA-related cancers. BRCA-related cancers include: ? Breast. ? Ovarian. ? Tubal. ? Peritoneal cancers.  Results of the assessment will determine the need for genetic counseling and BRCA1 and BRCA2 testing.  Cervical Cancer Your health care provider may recommend that you be screened regularly for cancer of the pelvic organs (ovaries, uterus, and vagina). This screening involves a pelvic examination, including checking for microscopic changes to the surface of your cervix (Pap test). You may be encouraged to have this screening done every 3 years, beginning at age 42.  For women ages 79-65, health care providers may recommend pelvic exams and Pap testing every 3 years, or they may recommend the Pap and pelvic exam, combined with testing for human papilloma virus (HPV), every 5 years. Some types of HPV increase your risk of cervical cancer. Testing for HPV may also be done on women of any age with unclear Pap test results.  Other health care providers may not recommend any screening for nonpregnant women who are considered low risk for pelvic cancer and who do not have symptoms. Ask your health care provider if a screening pelvic exam is right for you.  If you have had past treatment for cervical cancer or a condition that could lead to cancer, you need Pap tests and screening for cancer for at least 20 years after your treatment. If Pap tests have been discontinued, your risk factors (such as having a new sexual partner) need to be reassessed to determine if screening should resume. Some women have medical problems that increase  the chance of getting cervical cancer. In these cases, your health care provider may recommend more frequent screening and Pap tests.  Colorectal Cancer  This type of cancer can be detected and often prevented.  Routine colorectal cancer screening usually begins at 51 years of age and continues through 51 years of age.  Your health care provider may recommend screening at an earlier age if you have risk factors for colon cancer.  Your health care provider may also recommend using home test kits to check for hidden blood in the stool.  A small camera at the end of a tube can be used to examine your colon directly (sigmoidoscopy or colonoscopy). This is done to check for the earliest forms of colorectal cancer.  Routine screening usually begins at age 58.  Direct examination of the colon should be repeated every 5-10 years through 51 years of age. However, you may need to be screened more often if early forms of precancerous polyps or small growths are found.  Skin Cancer  Check your skin from head  to toe regularly.  Tell your health care provider about any new moles or changes in moles, especially if there is a change in a mole's shape or color.  Also tell your health care provider if you have a mole that is larger than the size of a pencil eraser.  Always use sunscreen. Apply sunscreen liberally and repeatedly throughout the day.  Protect yourself by wearing long sleeves, pants, a wide-brimmed hat, and sunglasses whenever you are outside.  Heart disease, diabetes, and high blood pressure  High blood pressure causes heart disease and increases the risk of stroke. High blood pressure is more likely to develop in: ? People who have blood pressure in the high end of the normal range (130-139/85-89 mm Hg). ? People who are overweight or obese. ? People who are African American.  If you are 12-80 years of age, have your blood pressure checked every 3-5 years. If you are 74 years of age  or older, have your blood pressure checked every year. You should have your blood pressure measured twice-once when you are at a hospital or clinic, and once when you are not at a hospital or clinic. Record the average of the two measurements. To check your blood pressure when you are not at a hospital or clinic, you can use: ? An automated blood pressure machine at a pharmacy. ? A home blood pressure monitor.  If you are between 6 years and 39 years old, ask your health care provider if you should take aspirin to prevent strokes.  Have regular diabetes screenings. This involves taking a blood sample to check your fasting blood sugar level. ? If you are at a normal weight and have a low risk for diabetes, have this test once every three years after 51 years of age. ? If you are overweight and have a high risk for diabetes, consider being tested at a younger age or more often. Preventing infection Hepatitis B  If you have a higher risk for hepatitis B, you should be screened for this virus. You are considered at high risk for hepatitis B if: ? You were born in a country where hepatitis B is common. Ask your health care provider which countries are considered high risk. ? Your parents were born in a high-risk country, and you have not been immunized against hepatitis B (hepatitis B vaccine). ? You have HIV or AIDS. ? You use needles to inject street drugs. ? You live with someone who has hepatitis B. ? You have had sex with someone who has hepatitis B. ? You get hemodialysis treatment. ? You take certain medicines for conditions, including cancer, organ transplantation, and autoimmune conditions.  Hepatitis C  Blood testing is recommended for: ? Everyone born from [birth year] through 1964. ? Anyone with known risk factors for hepatitis C.  Sexually transmitted infections (STIs)  You should be screened for sexually transmitted infections (STIs) including gonorrhea and chlamydia if: ? You are  sexually active and are younger than 51 years of age. ? You are older than 51 years of age and your health care provider tells you that you are at risk for this type of infection. ? Your sexual activity has changed since you were last screened and you are at an increased risk for chlamydia or gonorrhea. Ask your health care provider if you are at risk.  If you do not have HIV, but are at risk, it may be recommended that you take a prescription medicine daily to prevent HIV  infection. This is called pre-exposure prophylaxis (PrEP). You are considered at risk if: ? You are sexually active and do not regularly use condoms or know the HIV status of your partner(s). ? You take drugs by injection. ? You are sexually active with a partner who has HIV.  Talk with your health care provider about whether you are at high risk of being infected with HIV. If you choose to begin PrEP, you should first be tested for HIV. You should then be tested every 3 months for as long as you are taking PrEP. Pregnancy  If you are premenopausal and you may become pregnant, ask your health care provider about preconception counseling.  If you may become pregnant, take 400 to 800 micrograms (mcg) of folic acid every day.  If you want to prevent pregnancy, talk to your health care provider about birth control (contraception). Osteoporosis and menopause  Osteoporosis is a disease in which the bones lose minerals and strength with aging. This can result in serious bone fractures. Your risk for osteoporosis can be identified using a bone density scan.  If you are 26 years of age or older, or if you are at risk for osteoporosis and fractures, ask your health care provider if you should be screened.  Ask your health care provider whether you should take a calcium or vitamin D supplement to lower your risk for osteoporosis.  Menopause may have certain physical symptoms and risks.  Hormone replacement therapy may reduce some  of these symptoms and risks. Talk to your health care provider about whether hormone replacement therapy is right for you. Follow these instructions at home:  Schedule regular health, dental, and eye exams.  Stay current with your immunizations.  Do not use any tobacco products including cigarettes, chewing tobacco, or electronic cigarettes.  If you are pregnant, do not drink alcohol.  If you are breastfeeding, limit how much and how often you drink alcohol.  Limit alcohol intake to no more than 1 drink per day for nonpregnant women. One drink equals 12 ounces of beer, 5 ounces of wine, or 1 ounces of hard liquor.  Do not use street drugs.  Do not share needles.  Ask your health care provider for help if you need support or information about quitting drugs.  Tell your health care provider if you often feel depressed.  Tell your health care provider if you have ever been abused or do not feel safe at home. This information is not intended to replace advice given to you by your health care provider. Make sure you discuss any questions you have with your health care provider. Document Released: 03/18/2011 Document Revised: 02/08/2016 Document Reviewed: 06/06/2015 Elsevier Interactive Patient Education  [employer].

## 2018-02-26 NOTE — Progress Notes (Signed)
Subjective:    Patient ID: Dawn Townsend, female    DOB: 1965/11/16, 52 y.o.   MRN: [id number]  HPI She is here for a physical exam.   She is not able to sleep.  We have discussed this at previous visits.  A couple of nights last week she only got 4 hrs of sleep between the 2 nights. She thinks some of it is depression - it t intermittent.  She does have some anxiety.  On average she probably gets 4-6 hours of sleep in the night.     Her left lateral hip hurts - it is intermittent.  Started one month ago.  She does see an orthopedic and can ask for orthopedic next week  GERD:  She is taking prevacid once a day - it is not approved by her insurance for 2/day. She feels nausea about 3/week - rarely gerd.  The nausea typically occurs in morning.    Medications and allergies reviewed with patient and updated if appropriate.  Patient Active Problem List   Diagnosis Date Noted  . 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 Prediabetes 10/15/2016  . Asthma 06/27/2016  . GERD (gastroesophageal reflux disease) 06/27/2016  . Essential hypertension, benign 06/27/2016  . Morbid obesity ([place]) 06/27/2016  . Family history of diabetes mellitus (DM) 06/27/2016  . Eczema 06/27/2016  . Chronic lower back pain 06/27/2016  . Bilateral knee pain 06/27/2016    Current Outpatient Medications on File Prior to Visit  Medication Sig Dispense Refill  . Cholecalciferol (VITAMIN D PO) Take by mouth.    . diclofenac (VOLTAREN) 75 MG EC tablet   0  . lansoprazole (PREVACID) 30 MG capsule Take 1 capsule (30 mg total) by mouth 2 (two) times daily before a meal. 180 capsule 1  . losartan-hydrochlorothiazide (HYZAAR) 100-12.5 MG tablet Take 1 tablet by mouth daily. 90 tablet 1  . montelukast (SINGULAIR) 10 MG tablet Take 1 tablet (10 mg total) by mouth at bedtime. 90 tablet 1   No current facility-administered medications on file prior to visit.     Past Medical History:  Diagnosis Date  . Arthritis    knee   .  Asthma    no inhaler  . Fibroids   . GERD (gastroesophageal reflux disease)   . Hyperlipidemia   . Hypertension   . SVD (spontaneous vaginal delivery)    x 1  . Tendonitis of foot     Past Surgical History:  Procedure Laterality Date  . ABDOMINAL HYSTERECTOMY Bilateral 06/08/2014   Procedure: HYSTERECTOMY ABDOMINAL/BILATERAL SALPINGECTOMY/POSSIBLE BILATERAL SALPINGO OOPHORECTOMY;  Surgeon: Daria Pastures, MD;  Location: [facility];  Service: Gynecology;  Laterality: Bilateral;  POSSIBLE STAGING  . BILATERAL SALPINGECTOMY Right 06/08/2014   Procedure: BILATERAL SALPINGECTOMY;  Surgeon: Daria Pastures, MD;  Location: [facility];  Service: Gynecology;  Laterality: Right;  . CHOLECYSTECTOMY  2000  . OOPHORECTOMY Left 06/08/2014   Procedure: OOPHORECTOMY;  Surgeon: Daria Pastures, MD;  Location: [facility];  Service: Gynecology;  Laterality: Left;  . WISDOM TOOTH EXTRACTION      Social History   Socioeconomic History  . Marital status: Single    Spouse name: Not on file  . Number of children: Not on file  . Years of education: Not on file  . Highest education level: Not on file  Occupational History  . Not on file  Social Needs  . Financial resource strain: Not on file  . Food insecurity:    Worry: Not on file  Inability: Not on file  . Transportation needs:    Medical: Not on file    Non-medical: Not on file  Tobacco Use  . Smoking status: Never Smoker  . Smokeless tobacco: Never Used  Substance and Sexual Activity  . Alcohol use: No  . Drug use: No  . Sexual activity: Not Currently    Birth control/protection: None  Lifestyle  . Physical activity:    Days per week: Not on file    Minutes per session: Not on file  . Stress: Not on file  Relationships  . Social connections:    Talks on phone: Not on file    Gets together: Not on file    Attends religious service: Not on file    Active member of club or organization: Not on file    Attends meetings of clubs or  organizations: Not on file    Relationship status: Not on file  Other Topics Concern  . Not on file  Social History Narrative  . Not on file    Family History  Problem Relation Age of Onset  . Arthritis Mother   . Heart disease Mother   . Hypertension Mother   . Diabetes Mother   . Arthritis Father   . Heart disease Father   . Hypertension Father   . Diabetes Father   . Arthritis Maternal Grandmother   . Arthritis Maternal Grandfather   . Arthritis Paternal Grandmother   . Arthritis Paternal Grandfather     Review of Systems  Constitutional: Negative for chills and fever.  Eyes: Negative for visual disturbance.  Respiratory: Positive for shortness of breath (occasional, usually with exertion - esp with stairs). Negative for cough and wheezing.   Cardiovascular: Positive for leg swelling (mild in ankles). Negative for chest pain and palpitations.  Gastrointestinal: Positive for nausea (from gerd). Negative for abdominal pain, blood in stool, constipation and diarrhea.  Genitourinary: Positive for dysuria (will see urology). Negative for hematuria.  Musculoskeletal: Positive for arthralgias (knees) and back pain (from arthritis).  Skin: Negative for color change and rash.  Neurological: Positive for light-headedness and headaches (occ, stress related).  Psychiatric/Behavioral: Positive for dysphoric mood and sleep disturbance. The patient is nervous/anxious.        Objective:   Vitals:   02/27/18 1534  BP: (!) 134/94  Pulse: 92  Resp: 16  Temp: 97.8 F (36.6 C)  SpO2: 97%   Filed Weights   02/27/18 1534  Weight: 279 lb (126.6 kg)   Body mass index is 40.03 kg/m.  Wt Readings from Last 3 Encounters:  02/27/18 279 lb (126.6 kg)  11/12/17 274 lb (124.3 kg)  04/16/17 274 lb (124.3 kg)     Physical Exam Constitutional: She appears well-developed and well-nourished. No distress.  HENT:  Head: Normocephalic and atraumatic.  Right Ear: External ear normal.  Normal ear canal and TM Left Ear: External ear normal.  Normal ear canal and TM Mouth/Throat: Oropharynx is clear and moist.  Eyes: Conjunctivae and EOM are normal.  Neck: Neck supple. No tracheal deviation present. No thyromegaly present.  No carotid bruit  Cardiovascular: Normal rate, regular rhythm and normal heart sounds.   1/6 systolic murmur heard.  Trace ankle b/l edema. Pulmonary/Chest: Effort normal and breath sounds normal. No respiratory distress. She has no wheezes. She has no rales.  Breast: deferred to Gyn Abdominal: Soft. She exhibits no distension. There is no tenderness.  Lymphadenopathy: She has no cervical adenopathy.  Skin: Skin is warm and dry.  She is not diaphoretic.  Psychiatric: She has a normal mood and affect. Her behavior is normal.        Assessment & Plan:   Physical exam: Screening blood work   ordered Immunizations  Discussed shingrix, others up to date Colonoscopy   Up to date  Mammogram   Due - will schedule Gyn   Due - will schedule Eye exams  Up to date  EKG     Done 2015 Exercise  None - due to knee arthritis Weight working on weight loss - working with active health with [facility] no concerns Substance abuse   none  See Problem List for Assessment and Plan of chronic medical problems.   Follow-up in 6 weeks since we are starting Zantac and Remeron

## 2018-02-27 ENCOUNTER — Encounter: Payer: Self-pay | Admitting: Internal Medicine

## 2018-02-27 ENCOUNTER — Ambulatory Visit (INDEPENDENT_AMBULATORY_CARE_PROVIDER_SITE_OTHER): Payer: 59 | Admitting: Internal Medicine

## 2018-02-27 VITALS — BP 134/94 | HR 92 | Temp 97.8°F | Resp 16 | Wt 279.0 lb

## 2018-02-27 DIAGNOSIS — K219 Gastro-esophageal reflux disease without esophagitis: Secondary | ICD-10-CM | POA: Diagnosis not present

## 2018-02-27 DIAGNOSIS — Z0001 Encounter for general adult medical examination with abnormal findings: Secondary | ICD-10-CM

## 2018-02-27 DIAGNOSIS — R7303 Prediabetes: Secondary | ICD-10-CM | POA: Diagnosis not present

## 2018-02-27 DIAGNOSIS — I10 Essential (primary) hypertension: Secondary | ICD-10-CM | POA: Diagnosis not present

## 2018-02-27 DIAGNOSIS — M25472 Effusion, left ankle: Secondary | ICD-10-CM | POA: Diagnosis not present

## 2018-02-27 DIAGNOSIS — M25471 Effusion, right ankle: Secondary | ICD-10-CM

## 2018-02-27 DIAGNOSIS — R011 Cardiac murmur, unspecified: Secondary | ICD-10-CM

## 2018-02-27 DIAGNOSIS — R600 Localized edema: Secondary | ICD-10-CM | POA: Insufficient documentation

## 2018-02-27 DIAGNOSIS — G4709 Other insomnia: Secondary | ICD-10-CM

## 2018-02-27 DIAGNOSIS — J45909 Unspecified asthma, uncomplicated: Secondary | ICD-10-CM

## 2018-02-27 MED ORDER — RANITIDINE HCL 300 MG PO TABS: 300.0000 mg | ORAL_TABLET | Freq: Every day | ORAL | 1 refills | Status: DC

## 2018-02-27 MED ORDER — MIRTAZAPINE 15 MG PO TABS: 15.0000 mg | ORAL_TABLET | Freq: Every day | ORAL | 5 refills | Status: DC

## 2018-02-27 MED ORDER — LANSOPRAZOLE 30 MG PO CPDR: 30.0000 mg | DELAYED_RELEASE_CAPSULE | Freq: Every day | ORAL | 1 refills | Status: DC

## 2018-02-27 MED FILL — MIRTAZAPINE 15 MG TAB: 15 | 30 days supply | Qty: 30 | Fill #0

## 2018-02-27 MED FILL — raNITIdine HCL 300 MG TABS: 300 | 90 days supply | Qty: 90 | Fill #0

## 2018-02-27 NOTE — Assessment & Plan Note (Addendum)
Still having nausea about 3 times a week-typically in the morning GERD not controlled Continue Prevacid once daily Will add zantac 300 gm at night Follow-up in 6 weeks-if not controlled may need to consider Dexilant Discussed the importance of weight loss Discussed that her diclofenac that she takes for her knee arthritis is likely causing some of her GERD

## 2018-02-27 NOTE — Assessment & Plan Note (Signed)
Taking Singulair daily Controlled

## 2018-02-27 NOTE — Assessment & Plan Note (Signed)
Blood pressure slightly elevated here today, but has been overall controlled We will continue current medications, but need to assure her blood pressure overall is well controlled Work on weight loss Try to increase activity Decrease salt intake CMP

## 2018-02-27 NOTE — Assessment & Plan Note (Addendum)
Check a1c Low sugar / carb diet Stressed regular exercise, weight loss Can consider metformin-we will discuss at her next visit

## 2018-02-27 NOTE — Assessment & Plan Note (Signed)
Mild in nature No previous echocardiogram We will work on improving blood pressure control, weight loss Will monitor for now

## 2018-02-27 NOTE — Assessment & Plan Note (Signed)
Very mild in nature Work on weight loss Improved blood pressure control Check labs today Will monitor

## 2018-02-27 NOTE — Assessment & Plan Note (Signed)
Had a long discussion today regarding her weight-stressed that she needs to lose weight Advised that this would improve some of her knee and back pain.  Would also help her GERD, hypertension and prediabetes It would likely help with her overall energy level and sleep Encouraged her to start doing any exercise possible despite her knee pain and back pain Decrease portions, decrease carbs and sugars Can refer to nutrition if needed

## 2018-02-27 NOTE — Assessment & Plan Note (Signed)
Natural remedies have not been effective Still having sleep difficulties Has some anxiety and depression Will try Remeron 15 mg at night-can try 7.5 mg for a night or 2 first If not effective can consider trazodone

## 2018-03-04 DIAGNOSIS — M170 Bilateral primary osteoarthritis of knee: Secondary | ICD-10-CM | POA: Diagnosis not present

## 2018-03-04 DIAGNOSIS — M7062 Trochanteric bursitis, left hip: Secondary | ICD-10-CM | POA: Diagnosis not present

## 2018-03-05 ENCOUNTER — Encounter: Payer: Self-pay | Admitting: Internal Medicine

## 2018-03-05 ENCOUNTER — Other Ambulatory Visit (INDEPENDENT_AMBULATORY_CARE_PROVIDER_SITE_OTHER): Payer: 59

## 2018-03-05 DIAGNOSIS — I10 Essential (primary) hypertension: Secondary | ICD-10-CM | POA: Diagnosis not present

## 2018-03-05 DIAGNOSIS — Z0001 Encounter for general adult medical examination with abnormal findings: Secondary | ICD-10-CM | POA: Diagnosis not present

## 2018-03-05 DIAGNOSIS — R7303 Prediabetes: Secondary | ICD-10-CM | POA: Diagnosis not present

## 2018-03-05 LAB — LIPID PANEL
Cholesterol: 210 mg/dL — ABNORMAL HIGH (ref 0–200)
HDL: 77.8 mg/dL (ref >39.00)
LDL Cholesterol: 120 mg/dL — ABNORMAL HIGH (ref 0–99)
NonHDL: 132.18
TRIGLYCERIDES: 63 mg/dL (ref 0.0–149.0)
Total CHOL/HDL Ratio: 3
VLDL: 12.6 mg/dL (ref 0.0–40.0)

## 2018-03-05 LAB — CBC WITH DIFFERENTIAL/PLATELET
BASOS ABS: 0 10*3/uL (ref 0.0–0.1)
Basophils Relative: 0.6 % (ref 0.0–3.0)
EOS PCT: 0.2 % (ref 0.0–5.0)
Eosinophils Absolute: 0 10*3/uL (ref 0.0–0.7)
HCT: 40.9 % (ref 36.0–46.0)
HEMOGLOBIN: 13.4 g/dL (ref 12.0–15.0)
Lymphocytes Relative: 20.2 % (ref 12.0–46.0)
Lymphs Abs: 1.3 10*3/uL (ref 0.7–4.0)
MCHC: 32.7 g/dL (ref 30.0–36.0)
MCV: 81.8 fl (ref 78.0–100.0)
MONOS PCT: 12.7 % — AB (ref 3.0–12.0)
Monocytes Absolute: 0.8 10*3/uL (ref 0.1–1.0)
NEUTROS PCT: 66.3 % (ref 43.0–77.0)
Neutro Abs: 4.4 10*3/uL (ref 1.4–7.7)
Platelets: 249 10*3/uL (ref 150.0–400.0)
RBC: 5 Mil/uL (ref 3.87–5.11)
RDW: 14.9 % (ref 11.5–15.5)
WBC: 6.6 10*3/uL (ref 4.0–10.5)

## 2018-03-05 LAB — COMPREHENSIVE METABOLIC PANEL
ALBUMIN: 3.7 g/dL (ref 3.5–5.2)
ALK PHOS: 86 U/L (ref 39–117)
ALT: 11 U/L (ref 0–35)
AST: 10 U/L (ref 0–37)
BILIRUBIN TOTAL: 0.4 mg/dL (ref 0.2–1.2)
BUN: 17 mg/dL (ref 6–23)
CALCIUM: 9.2 mg/dL (ref 8.4–10.5)
CO2: 28 mEq/L (ref 19–32)
Chloride: 102 mEq/L (ref 96–112)
Creatinine, Ser: 0.54 mg/dL (ref 0.40–1.20)
GFR: 152.91 mL/min (ref >60.00)
Glucose, Bld: 89 mg/dL (ref 70–99)
POTASSIUM: 3.8 meq/L (ref 3.5–5.1)
Sodium: 138 mEq/L (ref 135–145)
TOTAL PROTEIN: 7.9 g/dL (ref 6.0–8.3)

## 2018-03-05 LAB — TSH: TSH: 1.38 u[IU]/mL (ref 0.35–4.50)

## 2018-03-05 LAB — HEMOGLOBIN A1C: HEMOGLOBIN A1C: 5.8 % (ref 4.6–6.5)

## 2018-03-18 MED FILL — DICLOFENAC SODIUM 75 MG TAB: 75 | 30 days supply | Qty: 60 | Fill #1

## 2018-03-18 MED FILL — LOSARTAN-HCTZ 100-12.5 MG T: 100-12.5 | 90 days supply | Qty: 90 | Fill #1

## 2018-04-14 ENCOUNTER — Ambulatory Visit: Payer: 59 | Admitting: Internal Medicine

## 2018-04-21 MED FILL — DICLOFENAC SODIUM 75 MG TAB: 75 | 30 days supply | Qty: 60 | Fill #2

## 2018-05-13 ENCOUNTER — Ambulatory Visit: Payer: 59 | Admitting: Internal Medicine

## 2018-05-20 ENCOUNTER — Other Ambulatory Visit: Payer: Self-pay | Admitting: Internal Medicine

## 2018-05-20 MED FILL — MONTELUKAST SOD 10 MG TAB: 10 | 90 days supply | Qty: 90 | Fill #1

## 2018-05-20 MED FILL — DICLOFENAC SODIUM 75 MG TAB: 75 | 30 days supply | Qty: 60 | Fill #3

## 2018-05-21 MED FILL — LANSOPRAZOLE DR 30 MG CAP: 30 | 90 days supply | Qty: 90 | Fill #0

## 2018-06-10 ENCOUNTER — Ambulatory Visit: Payer: 59 | Admitting: Internal Medicine

## 2018-06-10 ENCOUNTER — Encounter: Payer: Self-pay | Admitting: Internal Medicine

## 2018-06-10 VITALS — BP 144/88 | HR 76 | Temp 98.1°F | Resp 16 | Ht 70.0 in | Wt 277.8 lb

## 2018-06-10 DIAGNOSIS — Z23 Encounter for immunization: Secondary | ICD-10-CM

## 2018-06-10 DIAGNOSIS — M25471 Effusion, right ankle: Secondary | ICD-10-CM | POA: Diagnosis not present

## 2018-06-10 DIAGNOSIS — G4709 Other insomnia: Secondary | ICD-10-CM | POA: Diagnosis not present

## 2018-06-10 DIAGNOSIS — L219 Seborrheic dermatitis, unspecified: Secondary | ICD-10-CM | POA: Diagnosis not present

## 2018-06-10 DIAGNOSIS — K219 Gastro-esophageal reflux disease without esophagitis: Secondary | ICD-10-CM

## 2018-06-10 DIAGNOSIS — R7303 Prediabetes: Secondary | ICD-10-CM

## 2018-06-10 DIAGNOSIS — I10 Essential (primary) hypertension: Secondary | ICD-10-CM

## 2018-06-10 DIAGNOSIS — J45909 Unspecified asthma, uncomplicated: Secondary | ICD-10-CM | POA: Diagnosis not present

## 2018-06-10 DIAGNOSIS — M25472 Effusion, left ankle: Secondary | ICD-10-CM

## 2018-06-10 MED ORDER — TRIAMCINOLONE ACETONIDE 0.5 % EX CREA: 1.0000 "application " | TOPICAL_CREAM | Freq: Three times a day (TID) | CUTANEOUS | 2 refills | Status: DC

## 2018-06-10 MED FILL — TRIAMCINOLONE 0.5% CREAM: 0.5 | 7 days supply | Qty: 30 | Fill #0

## 2018-06-10 NOTE — Progress Notes (Signed)
Subjective:    Patient ID: Dawn Townsend, female    DOB: 12/06/1965, 52 y.o.   MRN: [id number]  HPI The patient is here for follow up.  Hair itching:  She was using a oil on her hair and the right side of her hair by her ear is very itchy and feels wet at times.  She does not have the symptoms in all the places that she used the oil, just near the right year.  She also has some dryness and itching on the ear itself.  It wakes her up at night.  She does have a history of eczema on the scalp.     Hypertension: She did not take her BP medication for a couple of days due to increased urination and not being able to get to the bathroom while at work.  She started taking it yesterday. She is compliant with a low sodium diet.  She gets leg edema.  She denies chest pain, palpitations, shortness of breath and regular headaches. She is not exercising regularly.   She knows she needs to start exercising and work on weight loss.  Prediabetes:  She is compliant with a low sugar/carbohydrate diet.  She is not exercising regularly.  GERD:  She is taking her medication daily as prescribed.  She denies any GERD symptoms and feels her GERD is well controlled.   Asthma:  She takes singulair daily.  Her asthma is controlled.  She denies any cough, wheeze or shortness of breath.  Medications and allergies reviewed with patient and updated if appropriate.  Patient Active Problem List   Diagnosis Date Noted  . Cardiac murmur 02/27/2018  . Ankle edema, bilateral 02/27/2018  . 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 Prediabetes 10/15/2016  . Asthma 06/27/2016  . GERD (gastroesophageal reflux disease) 06/27/2016  . Essential hypertension, benign 06/27/2016  . Morbid obesity ([place]) 06/27/2016  . Family history of diabetes mellitus (DM) 06/27/2016  . Eczema 06/27/2016  . Chronic lower back pain 06/27/2016  . Bilateral knee pain 06/27/2016    Current Outpatient Medications on File Prior to Visit  Medication Sig  Dispense Refill  . Cholecalciferol (VITAMIN D PO) Take by mouth.    . diclofenac (VOLTAREN) 75 MG EC tablet   0  . lansoprazole (PREVACID) 30 MG capsule TAKE 1 CAPSULE (30 MG TOTAL) BY MOUTH DAILY BEFORE BREAKFAST. 90 capsule 1  . losartan-hydrochlorothiazide (HYZAAR) 100-12.5 MG tablet Take 1 tablet by mouth daily. 90 tablet 1  . mirtazapine (REMERON) 15 MG tablet Take 1 tablet (15 mg total) by mouth at bedtime. 30 tablet 5  . montelukast (SINGULAIR) 10 MG tablet Take 1 tablet (10 mg total) by mouth at bedtime. 90 tablet 1  . ranitidine (ZANTAC) 300 MG tablet Take 1 tablet (300 mg total) by mouth at bedtime. 90 tablet 1   No current facility-administered medications on file prior to visit.     Past Medical History:  Diagnosis Date  . Arthritis    knee   . Asthma    no inhaler  . Fibroids   . GERD (gastroesophageal reflux disease)   . Hyperlipidemia   . Hypertension   . SVD (spontaneous vaginal delivery)    x 1  . Tendonitis of foot     Past Surgical History:  Procedure Laterality Date  . ABDOMINAL HYSTERECTOMY Bilateral 06/08/2014   Procedure: HYSTERECTOMY ABDOMINAL/BILATERAL SALPINGECTOMY/POSSIBLE BILATERAL SALPINGO OOPHORECTOMY;  Surgeon: Daria Pastures, MD;  Location: [facility];  Service: Gynecology;  Laterality: Bilateral;  POSSIBLE STAGING  . BILATERAL SALPINGECTOMY Right 06/08/2014   Procedure: BILATERAL SALPINGECTOMY;  Surgeon: Daria Pastures, MD;  Location: [facility];  Service: Gynecology;  Laterality: Right;  . CHOLECYSTECTOMY  2000  . OOPHORECTOMY Left 06/08/2014   Procedure: OOPHORECTOMY;  Surgeon: Daria Pastures, MD;  Location: [facility];  Service: Gynecology;  Laterality: Left;  . WISDOM TOOTH EXTRACTION      Social History   Socioeconomic History  . Marital status: Single    Spouse name: Not on file  . Number of children: Not on file  . Years of education: Not on file  . Highest education level: Not on file  Occupational History  . Not on file  Social  Needs  . Financial resource strain: Not on file  . Food insecurity:    Worry: Not on file    Inability: Not on file  . Transportation needs:    Medical: Not on file    Non-medical: Not on file  Tobacco Use  . Smoking status: Never Smoker  . Smokeless tobacco: Never Used  Substance and Sexual Activity  . Alcohol use: No  . Drug use: No  . Sexual activity: Not Currently    Birth control/protection: None  Lifestyle  . Physical activity:    Days per week: Not on file    Minutes per session: Not on file  . Stress: Not on file  Relationships  . Social connections:    Talks on phone: Not on file    Gets together: Not on file    Attends religious service: Not on file    Active member of club or organization: Not on file    Attends meetings of clubs or organizations: Not on file    Relationship status: Not on file  Other Topics Concern  . Not on file  Social History Narrative  . Not on file    Family History  Problem Relation Age of Onset  . Arthritis Mother   . Heart disease Mother   . Hypertension Mother   . Diabetes Mother   . Arthritis Father   . Heart disease Father   . Hypertension Father   . Diabetes Father   . Arthritis Maternal Grandmother   . Arthritis Maternal Grandfather   . Arthritis Paternal Grandmother   . Arthritis Paternal Grandfather     Review of Systems  Constitutional: Negative for chills and fever.  Respiratory: Negative for cough, shortness of breath and wheezing.   Cardiovascular: Positive for leg swelling. Negative for chest pain and palpitations.  Neurological: Positive for headaches (when she does not take the BP medications). Negative for light-headedness.       Objective:   Vitals:   06/10/18 1348  BP: (!) 144/88  Pulse: 76  Resp: 16  Temp: 98.1 F (36.7 C)  SpO2: 98%   BP Readings from Last 3 Encounters:  06/10/18 (!) 144/88  02/27/18 (!) 134/94  11/12/17 124/76   Wt Readings from Last 3 Encounters:  06/10/18 277 lb 12.8  oz (126 kg)  02/27/18 279 lb (126.6 kg)  11/12/17 274 lb (124.3 kg)   Body mass index is 39.86 kg/m.   Physical Exam    Constitutional: Appears well-developed and well-nourished. No distress.  HENT:  Head: Normocephalic and atraumatic.  Neck: Neck supple. No tracheal deviation present. No thyromegaly present.  No cervical lymphadenopathy Cardiovascular: Normal rate, regular rhythm and normal heart sounds.   No murmur heard. No carotid bruit .  Mild bilateral lower extremity edema  Pulmonary/Chest: Effort normal and breath sounds normal. No respiratory distress. No has no wheezes. No rales.  Skin: Skin is warm and dry. Not diaphoretic.  Psychiatric: Normal mood and affect. Behavior is normal.      Assessment & Plan:    See Problem List for Assessment and Plan of chronic medical problems.   

## 2018-06-10 NOTE — Assessment & Plan Note (Signed)
GERD controlled Continue daily medication  

## 2018-06-10 NOTE — Assessment & Plan Note (Signed)
Dermatitis versus possible eczema Trial of triamcinolone cream times daily-use as needed not more than 14 days If this is not effective we will try ketoconazole cream

## 2018-06-10 NOTE — Assessment & Plan Note (Signed)
Stressed the importance of weight loss, which she agrees Start regular exercise Decrease portions Discussed her increased risk of diabetes and needing to get her blood pressure better controlled Also discussed that this would improve her knee pain and leg edema

## 2018-06-10 NOTE — Patient Instructions (Addendum)
  Flu immunization administered today.    Medications reviewed and updated.  Changes include :   A cream for your scalp.  Your prescription(s) have been submitted to your pharmacy. Please take as directed and contact our office if you believe you are having problem(s) with the medication(s).  A referral was ordered for nutrition.  Please followup in 6 months

## 2018-06-10 NOTE — Assessment & Plan Note (Signed)
Controlled Continue Singulair

## 2018-06-10 NOTE — Assessment & Plan Note (Signed)
Blood pressure slightly elevated here today, but did go a couple of days without taking her medication and restarted it yesterday-she understands she needs to take it on a daily basis and will continue it Gave her a note for work stating that she will need to go to the bathroom more often secondary to a side effect from 1 of her medications She would like to continue the hydrochlorothiazide because she does get leg edema No change in medications Encouraged her to start monitoring her blood pressure to ensure that it is well controlled Start regular exercise and work on weight loss

## 2018-06-10 NOTE — Assessment & Plan Note (Signed)
Check A1c Low-carb/sugar diet Start regular exercise and work on weight loss Follow-up in 6 months

## 2018-06-10 NOTE — Assessment & Plan Note (Signed)
Controlled with hydrochlorothiazide daily Continue

## 2018-06-10 NOTE — Assessment & Plan Note (Addendum)
Sleeping ok  Has not taken remeron, but would like to keep it on hand just in case she has an issue

## 2018-06-24 ENCOUNTER — Ambulatory Visit: Payer: 59 | Admitting: Internal Medicine

## 2018-06-24 MED FILL — DICLOFENAC SODIUM 75 MG TAB: 75 | 30 days supply | Qty: 60 | Fill #4

## 2018-07-16 ENCOUNTER — Other Ambulatory Visit: Payer: Self-pay | Admitting: Internal Medicine

## 2018-07-16 DIAGNOSIS — M170 Bilateral primary osteoarthritis of knee: Secondary | ICD-10-CM | POA: Diagnosis not present

## 2018-07-16 MED FILL — LOSARTAN-HCTZ 100-12.5 MG T: 100-12.5 | 90 days supply | Qty: 90 | Fill #0

## 2018-07-27 MED FILL — DICLOFENAC SODIUM 75 MG TAB: 75 | 30 days supply | Qty: 60 | Fill #5

## 2018-08-18 ENCOUNTER — Other Ambulatory Visit: Payer: Self-pay | Admitting: Internal Medicine

## 2018-08-18 MED FILL — MONTELUKAST SOD 10 MG TAB: 10 | 90 days supply | Qty: 90 | Fill #0

## 2018-08-18 MED FILL — LANSOPRAZOLE DR 30 MG CAP: 30 | 90 days supply | Qty: 90 | Fill #1

## 2018-08-31 MED FILL — DICLOFENAC SODIUM 75 MG TAB: 75 | 30 days supply | Qty: 60 | Fill #0

## 2018-09-07 ENCOUNTER — Ambulatory Visit: Payer: Self-pay | Admitting: *Deleted

## 2018-09-07 NOTE — Telephone Encounter (Signed)
Pt calling with productive cough since Saturday with green/clear with a little red sputum. Pt states that she began to have a runny nose last Wednesday and now feels like she has a sore throat and her nose is stopped up on one side. Pt denies any fever, difficulty breathing or wheezing at this time. Pt states she was taking Nyquil/Dayquil but has not been able to tolerate it recently because it feels like it is "sitting on my stomach". Pt given home care advice and advised that if symptoms become worse to return call to the office. Pt verbalized understanding.  Reason for Disposition . Cough  Answer Assessment - Initial Assessment Questions 1. ONSET: "When did the cough begin?"      Saturday 2. SEVERITY: "How bad is the cough today?"      Really bad now coughing a little bit more than I was 3. RESPIRATORY DISTRESS: "Describe your breathing."      N0 4. FEVER: "Do you have a fever?" If so, ask: "What is your temperature, how was it measured, and when did it start?"     No 5. SPUTUM: "Describe the color of your sputum" (clear, white, yellow, green)     Green/clear and thick and then 6. HEMOPTYSIS: "Are you coughing up any blood?" If so ask: "How much?" (flecks, streaks, tablespoons, etc.)     Streaks mixed in wiht 7. CARDIAC HISTORY: "Do you have any history of heart disease?" (e.g., heart attack, congestive heart failure)      No 8. LUNG HISTORY: "Do you have any history of lung disease?"  (e.g., pulmonary embolus, asthma, emphysema)     asthma 9. PE RISK FACTORS: "Do you have a history of blood clots?" (or: recent major surgery, recent prolonged travel, bedridden)     No 10. OTHER SYMPTOMS: "Do you have any other symptoms?" (e.g., runny nose, wheezing, chest pain)       Runny nose and sore throat 11. PREGNANCY: "Is there any chance you are pregnant?" "When was your last menstrual period?"       No partial hysterectomy in 2015 12. TRAVEL: "Have you traveled out of the country in the last  month?" (e.g., travel history, exposures)       No  Protocols used: [place]

## 2018-09-17 NOTE — Progress Notes (Signed)
Subjective:    Patient ID: Dawn Townsend, female    DOB: [birth year], 53 y.o.   MRN: [id number]  HPI She is here for an acute visit for cold symptoms.  Her symptoms started Dec  20th.  She is experiencing running nose, feeling hot and cold.  She then started coughing and had wheezing.  She took robitussin dm.  The cough has persisted.  It is productive of discolored sputum at times.  She has had some shortness of breath and wheezing.  She has some headaches and lightheadedness.  She denies any fevers or chills.  She has taken a bottle of robitussin.    She has two bumps on the side of her right hairline.  1 of the bumps was hurting, but currently does not hurt.  She has lost some hair along her hairline and would like to see derm.   Urine odor x a while - x 2-3 months.  Is not there all the time.  She is dysuria on occasion, but nothing consistent.  She denies increase in frequency or blood in the urine.  She states her urine is typically dark yellow and that is when she has the odor.  She is trying to completely eliminate drinking soda.  She is trying to drink more water.    Medications and allergies reviewed with patient and updated if appropriate.  Patient Active Problem List   Diagnosis Date Noted  . Seborrheic dermatitis of scalp 06/10/2018  . Cardiac murmur 02/27/2018  . Ankle edema, bilateral 02/27/2018  . 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 Prediabetes 10/15/2016  . Asthma 06/27/2016  . GERD (gastroesophageal reflux disease) 06/27/2016  . Essential hypertension, benign 06/27/2016  . Morbid obesity ([place]) 06/27/2016  . Family history of diabetes mellitus (DM) 06/27/2016  . Eczema 06/27/2016  . Chronic lower back pain 06/27/2016  . Bilateral knee pain 06/27/2016    Current Outpatient Medications on File Prior to Visit  Medication Sig Dispense Refill  . Cholecalciferol (VITAMIN D PO) Take by mouth.    . diclofenac (VOLTAREN) 75 MG EC tablet   0  . lansoprazole (PREVACID) 30  MG capsule TAKE 1 CAPSULE (30 MG TOTAL) BY MOUTH DAILY BEFORE BREAKFAST. 90 capsule 1  . losartan-hydrochlorothiazide (HYZAAR) 100-12.5 MG tablet TAKE 1 TABLET BY MOUTH DAILY. 90 tablet 1  . mirtazapine (REMERON) 15 MG tablet Take 1 tablet (15 mg total) by mouth at bedtime. 30 tablet 5  . montelukast (SINGULAIR) 10 MG tablet TAKE 1 TABLET (10 MG TOTAL) BY MOUTH AT BEDTIME. 90 tablet 1  . ranitidine (ZANTAC) 300 MG tablet Take 1 tablet (300 mg total) by mouth at bedtime. 90 tablet 1  . triamcinolone cream (KENALOG) 0.5 % Apply 1 application topically 3 (three) times daily. 30 g 2   No current facility-administered medications on file prior to visit.     Past Medical History:  Diagnosis Date  . Arthritis    knee   . Asthma    no inhaler  . Fibroids   . GERD (gastroesophageal reflux disease)   . Hyperlipidemia   . Hypertension   . SVD (spontaneous vaginal delivery)    x 1  . Tendonitis of foot     Past Surgical History:  Procedure Laterality Date  . ABDOMINAL HYSTERECTOMY Bilateral 06/08/2014   Procedure: HYSTERECTOMY ABDOMINAL/BILATERAL SALPINGECTOMY/POSSIBLE BILATERAL SALPINGO OOPHORECTOMY;  Surgeon: Daria Pastures, MD;  Location: [facility];  Service: Gynecology;  Laterality: Bilateral;  POSSIBLE STAGING  . BILATERAL SALPINGECTOMY Right 06/08/2014  Procedure: BILATERAL SALPINGECTOMY;  Surgeon: Daria Pastures, MD;  Location: [facility];  Service: Gynecology;  Laterality: Right;  . CHOLECYSTECTOMY  2000  . OOPHORECTOMY Left 06/08/2014   Procedure: OOPHORECTOMY;  Surgeon: Daria Pastures, MD;  Location: [facility];  Service: Gynecology;  Laterality: Left;  . WISDOM TOOTH EXTRACTION      Social History   Socioeconomic History  . Marital status: Single    Spouse name: Not on file  . Number of children: Not on file  . Years of education: Not on file  . Highest education level: Not on file  Occupational History  . Not on file  Social Needs  . Financial resource strain: Not on  file  . Food insecurity:    Worry: Not on file    Inability: Not on file  . Transportation needs:    Medical: Not on file    Non-medical: Not on file  Tobacco Use  . Smoking status: Never Smoker  . Smokeless tobacco: Never Used  Substance and Sexual Activity  . Alcohol use: No  . Drug use: No  . Sexual activity: Not Currently    Birth control/protection: None  Lifestyle  . Physical activity:    Days per week: Not on file    Minutes per session: Not on file  . Stress: Not on file  Relationships  . Social connections:    Talks on phone: Not on file    Gets together: Not on file    Attends religious service: Not on file    Active member of club or organization: Not on file    Attends meetings of clubs or organizations: Not on file    Relationship status: Not on file  Other Topics Concern  . Not on file  Social History Narrative  . Not on file    Family History  Problem Relation Age of Onset  . Arthritis Mother   . Heart disease Mother   . Hypertension Mother   . Diabetes Mother   . Arthritis Father   . Heart disease Father   . Hypertension Father   . Diabetes Father   . Arthritis Maternal Grandmother   . Arthritis Maternal Grandfather   . Arthritis Paternal Grandmother   . Arthritis Paternal Grandfather     Review of Systems  Constitutional: Negative for chills and fever.       Feeling hot/cold  HENT: Negative for congestion, ear pain, sinus pressure, sinus pain and sore throat.   Respiratory: Positive for cough (productive - discolored ), shortness of breath (mild) and wheezing.   Gastrointestinal: Negative for diarrhea and nausea.  Endocrine: Negative for polydipsia and polyuria.  Genitourinary: Positive for dysuria. Negative for frequency and hematuria.       Urine odor  Musculoskeletal: Negative for myalgias.  Neurological: Positive for light-headedness and headaches.       Objective:   Vitals:   09/18/18 0907  BP: (!) 138/92  Pulse: 74  Resp: 18   Temp: 98 F (36.7 C)  SpO2: 99%   Filed Weights   09/18/18 0907  Weight: 282 lb (127.9 kg)   Body mass index is 40.46 kg/m.  Wt Readings from Last 3 Encounters:  09/18/18 282 lb (127.9 kg)  06/10/18 277 lb 12.8 oz (126 kg)  02/27/18 279 lb (126.6 kg)     Physical Exam GENERAL APPEARANCE: Appears stated age, well appearing, NAD EYES: conjunctiva clear, no icterus HEENT: bilateral tympanic membranes and ear canals normal, oropharynx with mild erythema, no  thyromegaly, trachea midline, no cervical or supraclavicular lymphadenopathy LUNGS: Clear to auscultation without wheeze or crackles, unlabored breathing, good air entry bilaterally CARDIOVASCULAR: Normal S1,S2 without murmurs, no edema SKIN: warm, dry, 2 raised bumps right side of head near her temple-nontender        Assessment & Plan:   See Problem List for Assessment and Plan of chronic medical problems.

## 2018-09-18 ENCOUNTER — Encounter: Payer: Self-pay | Admitting: Internal Medicine

## 2018-09-18 ENCOUNTER — Ambulatory Visit: Payer: 59 | Admitting: Internal Medicine

## 2018-09-18 VITALS — BP 138/92 | HR 74 | Temp 98.0°F | Resp 18 | Ht 70.0 in | Wt 282.0 lb

## 2018-09-18 DIAGNOSIS — J209 Acute bronchitis, unspecified: Secondary | ICD-10-CM | POA: Diagnosis not present

## 2018-09-18 DIAGNOSIS — R8290 Unspecified abnormal findings in urine: Secondary | ICD-10-CM

## 2018-09-18 DIAGNOSIS — L989 Disorder of the skin and subcutaneous tissue, unspecified: Secondary | ICD-10-CM | POA: Diagnosis not present

## 2018-09-18 DIAGNOSIS — L659 Nonscarring hair loss, unspecified: Secondary | ICD-10-CM | POA: Diagnosis not present

## 2018-09-18 DIAGNOSIS — J45901 Unspecified asthma with (acute) exacerbation: Secondary | ICD-10-CM | POA: Diagnosis not present

## 2018-09-18 MED ORDER — AZITHROMYCIN 250 MG PO TABS: ORAL_TABLET | ORAL | 0 refills | Status: DC

## 2018-09-18 MED ORDER — HYDROCODONE-HOMATROPINE 5-1.5 MG/5ML PO SYRP: 5.0000 mL | ORAL_SOLUTION | Freq: Three times a day (TID) | ORAL | 0 refills | Status: DC | PRN

## 2018-09-18 MED ORDER — ALBUTEROL SULFATE 108 (90 BASE) MCG/ACT IN AEPB: 2.0000 | INHALATION_SPRAY | RESPIRATORY_TRACT | 8 refills | Status: DC | PRN

## 2018-09-18 MED FILL — AZITHROMYCIN 250 MG TABLET: 250 | 5 days supply | Qty: 6 | Fill #0

## 2018-09-18 MED FILL — PROAIR RESPICLICK INHAL PWD: 108 (90 BAS | 17 days supply | Qty: 1 | Fill #0

## 2018-09-18 MED FILL — HYDROCODONE-HOMATROPINE SOL: 5-1.5 | 8 days supply | Qty: 120 | Fill #0

## 2018-09-18 NOTE — Assessment & Plan Note (Signed)
Has had some hair loss along her hairline and would like to see dermatology Referred today

## 2018-09-18 NOTE — Assessment & Plan Note (Signed)
Likely bacterial  Start Z-Pak Prescription cough syrup otc cold medications Rest, fluid Call if no improvement

## 2018-09-18 NOTE — Assessment & Plan Note (Signed)
Abnormal urine odor associated with dark yellow urine intermittently for the past 2-3 months Intermittent dysuria, but no increase in frequency or blood in the urine She is trying to increase her water intake Sounds like the symptoms are more related to dehydration and concentrated urine She will work harder on increasing her water and if her symptoms do not improve she will let me know We will hold off on checking urine today

## 2018-09-18 NOTE — Assessment & Plan Note (Signed)
Mild exacerbation of asthma related to acute bronchitis Albuterol inhaler prescribed Z-Pak Cough syrup No wheezing on exam and no respiratory distress Call if symptoms do not improve

## 2018-09-18 NOTE — Patient Instructions (Signed)
Take the antibiotic as prescribed - complete the entire course.  Use the cough syrup as needed.  Use the inhaler as needed for wheeze, cough and SOB.   Continue over the counter cold medication, advil and tylenol.  Increase your fluids and rest.    Call if no improvement      A referral was ordered for dermatology.

## 2018-09-18 NOTE — Assessment & Plan Note (Signed)
On right side of face Tender at one point, but currently not tender Will refer to dermatology

## 2018-09-30 MED FILL — DICLOFENAC SODIUM 75 MG TAB: 75 | 30 days supply | Qty: 60 | Fill #1

## 2018-10-15 DIAGNOSIS — M170 Bilateral primary osteoarthritis of knee: Secondary | ICD-10-CM | POA: Diagnosis not present

## 2018-10-27 DIAGNOSIS — L659 Nonscarring hair loss, unspecified: Secondary | ICD-10-CM | POA: Diagnosis not present

## 2018-10-27 DIAGNOSIS — Z23 Encounter for immunization: Secondary | ICD-10-CM | POA: Diagnosis not present

## 2018-10-27 MED FILL — CLOBETASOL 0.05% SOLUTION: 0.05 | 14 days supply | Qty: 50 | Fill #0

## 2018-10-29 MED FILL — DICLOFENAC SODIUM 75 MG TAB: 75 | 30 days supply | Qty: 60 | Fill #2 | Status: TO

## 2018-10-30 ENCOUNTER — Telehealth: Payer: Self-pay | Admitting: Internal Medicine

## 2018-10-30 MED ORDER — HYDROCHLOROTHIAZIDE 12.5 MG PO CAPS: 12.5000 mg | ORAL_CAPSULE | Freq: Every day | ORAL | 3 refills | Status: DC

## 2018-10-30 MED ORDER — LOSARTAN POTASSIUM 100 MG PO TABS: 100.0000 mg | ORAL_TABLET | Freq: Every day | ORAL | 3 refills | Status: DC

## 2018-10-30 MED FILL — HYDROCHLOROTHIAZIDE 12.5 MG: 12.5 | 90 days supply | Qty: 90 | Fill #0

## 2018-10-30 MED FILL — LOSARTAN POTASSIUM 100 MG T: 100 | 90 days supply | Qty: 90 | Fill #0

## 2018-10-30 NOTE — Telephone Encounter (Signed)
Copied from [place] [id number]. Topic: Quick Communication - Rx Refill/Question >> Oct 30, 2018 11:31 AM Margot Ables wrote: Medication: losartan-hydrochlorothiazide (HYZAAR) 100-12.5 MG tablet - medication is on back order - pharmacy has separate meds in stock - please send new RX  Has the patient contacted their pharmacy? Yes - pharmacy calling [facility] (with phone number or street name): [place], [place] - [address] [phone] (Phone) [phone] (Fax)

## 2018-10-30 NOTE — Telephone Encounter (Signed)
Left message advising new rx has been sent to pharmacy, call back if any other questions

## 2018-10-30 NOTE — Telephone Encounter (Signed)
Routing to dr Jenny Reichmann, please advise in the absence of dr burns, can you send in replacement med----please advise, I will call patient back, thanks

## 2018-10-30 NOTE — Telephone Encounter (Signed)
Ok to change the combination rx to the 2 scripts - done erx

## 2018-12-04 ENCOUNTER — Other Ambulatory Visit: Payer: Self-pay | Admitting: Internal Medicine

## 2018-12-08 MED FILL — DICLOFENAC SODIUM 75 MG TAB: 75 | 30 days supply | Qty: 60 | Fill #0

## 2018-12-08 MED FILL — MONTELUKAST SOD 10 MG TAB: 10 | 30 days supply | Qty: 30 | Fill #0

## 2018-12-08 MED FILL — LANSOPRAZOLE 30 MG CPDR: 30 | 90 days supply | Qty: 90 | Fill #0

## 2018-12-11 ENCOUNTER — Ambulatory Visit: Payer: 59 | Admitting: Internal Medicine

## 2019-01-05 MED FILL — MONTELUKAST SOD 10 MG TAB: 10 | 30 days supply | Qty: 30 | Fill #1 | Status: TO

## 2019-01-05 MED FILL — DICLOFENAC SODIUM 75 MG TAB: 75 | 30 days supply | Qty: 60 | Fill #1 | Status: TO

## 2019-01-08 ENCOUNTER — Ambulatory Visit: Payer: 59 | Admitting: Internal Medicine

## 2019-01-15 ENCOUNTER — Ambulatory Visit: Payer: 59 | Admitting: Internal Medicine

## 2019-02-02 DIAGNOSIS — M170 Bilateral primary osteoarthritis of knee: Secondary | ICD-10-CM | POA: Diagnosis not present

## 2019-02-11 MED FILL — HYDROCHLOROTHIAZIDE 12.5 MG: 12.5 | 30 days supply | Qty: 30 | Fill #1

## 2019-02-11 MED FILL — DICLOFENAC SODIUM 75 MG TAB: 75 | 30 days supply | Qty: 60 | Fill #0

## 2019-02-11 MED FILL — LOSARTAN POTASSIUM 100 MG T: 100 | 30 days supply | Qty: 30 | Fill #1

## 2019-02-11 MED FILL — MONTELUKAST SOD 10 MG TAB: 10 | 30 days supply | Qty: 30 | Fill #0

## 2019-03-01 MED FILL — LANSOPRAZOLE DR 30 MG CAP: 30 | 90 days supply | Qty: 90 | Fill #0

## 2019-03-08 DIAGNOSIS — M25562 Pain in left knee: Secondary | ICD-10-CM | POA: Diagnosis not present

## 2019-03-08 DIAGNOSIS — M1711 Unilateral primary osteoarthritis, right knee: Secondary | ICD-10-CM | POA: Diagnosis not present

## 2019-03-08 MED FILL — HYDROCODON-APAP 5-325: 5-325 | 5 days supply | Qty: 40 | Fill #0

## 2019-03-12 ENCOUNTER — Other Ambulatory Visit: Payer: Self-pay | Admitting: Internal Medicine

## 2019-03-12 MED FILL — MONTELUKAST SOD 10 MG TAB: 10 | 30 days supply | Qty: 30 | Fill #0

## 2019-03-15 DIAGNOSIS — M1711 Unilateral primary osteoarthritis, right knee: Secondary | ICD-10-CM | POA: Diagnosis not present

## 2019-03-15 MED FILL — DICLOFENAC SODIUM 75 MG TAB: 75 | 30 days supply | Qty: 60 | Fill #0

## 2019-04-01 MED FILL — HYDROCHLOROTHIAZIDE 12.5 MG: 12.5 | 30 days supply | Qty: 30 | Fill #2

## 2019-04-01 MED FILL — LOSARTAN POTASSIUM 100 MG T: 100 | 30 days supply | Qty: 30 | Fill #2

## 2019-04-13 MED FILL — DICLOFENAC SODIUM 75 MG TAB: 75 | 30 days supply | Qty: 60 | Fill #1

## 2019-04-22 NOTE — Progress Notes (Signed)
Subjective:    Patient ID: Dawn Townsend, female    DOB: Jun 06, 1966, 53 y.o.   MRN: [id number]  HPI The patient is here for follow up.  She stands all day.  She is not exercising.    Hypertension, leg edema: She is taking her medication daily. She is compliant with a low sodium diet.  Her left leg > right leg have been swollen for the past 3 weeks. She is unsure why they swelled and why it went.  She denies chest pain, palpitations,  shortness of breath and regular headaches. She does not monitor her blood pressure at home - 133/86.    Prediabetes:  She is compliant with a low sugar/carbohydrate diet.  She is exercising regularly.  GERD:  She is taking over-the-counter omeprazole.  She feels her GERD is well controlled.   Asthma:  She takes singulair daily.  She denies any coughing, wheezing or shortness of breath.  Insomnia: We started Remeron at her last visit.  She states her sleep has been good she has not needed to take this.  She has anxiety on occasion, but has not felt like she needed anything.  She denies depression.  Medications and allergies reviewed with patient and updated if appropriate.  Patient Active Problem List   Diagnosis Date Noted  . Hair loss 09/18/2018  . Bumps on skin 09/18/2018  . Seborrheic dermatitis of scalp 06/10/2018  . Cardiac murmur 02/27/2018  . Ankle edema, bilateral 02/27/2018  . 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 Prediabetes 10/15/2016  . Asthma 06/27/2016  . GERD (gastroesophageal reflux disease) 06/27/2016  . Essential hypertension, benign 06/27/2016  . Morbid obesity ([place]) 06/27/2016  . Family history of diabetes mellitus (DM) 06/27/2016  . Eczema 06/27/2016  . Chronic lower back pain 06/27/2016  . Bilateral knee pain 06/27/2016    Current Outpatient Medications on File Prior to Visit  Medication Sig Dispense Refill  . Albuterol Sulfate (PROAIR RESPICLICK) 833 (90 Base) MCG/ACT AEPB Inhale 2 puffs into the lungs every 4 (four) hours as  needed (wheeze). 1 each 8  . Cholecalciferol (VITAMIN D PO) Take by mouth.    . diclofenac (VOLTAREN) 75 MG EC tablet   0  . hydrochlorothiazide (MICROZIDE) 12.5 MG capsule Take 1 capsule (12.5 mg total) by mouth daily. 90 capsule 3  . lansoprazole (PREVACID) 30 MG capsule TAKE 1 CAPSULE (30 MG TOTAL) BY MOUTH DAILY BEFORE BREAKFAST. 90 capsule 1  . losartan (COZAAR) 100 MG tablet Take 1 tablet (100 mg total) by mouth daily. 90 tablet 3  . mirtazapine (REMERON) 15 MG tablet Take 1 tablet (15 mg total) by mouth at bedtime. 30 tablet 5  . montelukast (SINGULAIR) 10 MG tablet Take 1 tablet (10 mg total) by mouth at bedtime. Annual appt is due must see provider for future refills 30 tablet 0  . triamcinolone cream (KENALOG) 0.5 % Apply 1 application topically 3 (three) times daily. 30 g 2   No current facility-administered medications on file prior to visit.     Past Medical History:  Diagnosis Date  . Arthritis    knee   . Asthma    no inhaler  . Fibroids   . GERD (gastroesophageal reflux disease)   . Hyperlipidemia   . Hypertension   . SVD (spontaneous vaginal delivery)    x 1  . Tendonitis of foot     Past Surgical History:  Procedure Laterality Date  . ABDOMINAL HYSTERECTOMY Bilateral 06/08/2014   Procedure: HYSTERECTOMY ABDOMINAL/BILATERAL  SALPINGECTOMY/POSSIBLE BILATERAL SALPINGO OOPHORECTOMY;  Surgeon: Daria Pastures, MD;  Location: [facility];  Service: Gynecology;  Laterality: Bilateral;  POSSIBLE STAGING  . BILATERAL SALPINGECTOMY Right 06/08/2014   Procedure: BILATERAL SALPINGECTOMY;  Surgeon: Daria Pastures, MD;  Location: [facility];  Service: Gynecology;  Laterality: Right;  . CHOLECYSTECTOMY  2000  . OOPHORECTOMY Left 06/08/2014   Procedure: OOPHORECTOMY;  Surgeon: Daria Pastures, MD;  Location: [facility];  Service: Gynecology;  Laterality: Left;  . WISDOM TOOTH EXTRACTION      Social History   Socioeconomic History  . Marital status: Single    Spouse name: Not on  file  . Number of children: Not on file  . Years of education: Not on file  . Highest education level: Not on file  Occupational History  . Not on file  Social Needs  . Financial resource strain: Not on file  . Food insecurity    Worry: Not on file    Inability: Not on file  . Transportation needs    Medical: Not on file    Non-medical: Not on file  Tobacco Use  . Smoking status: Never Smoker  . Smokeless tobacco: Never Used  Substance and Sexual Activity  . Alcohol use: No  . Drug use: No  . Sexual activity: Not Currently    Birth control/protection: None  Lifestyle  . Physical activity    Days per week: Not on file    Minutes per session: Not on file  . Stress: Not on file  Relationships  . Social Herbalist on phone: Not on file    Gets together: Not on file    Attends religious service: Not on file    Active member of club or organization: Not on file    Attends meetings of clubs or organizations: Not on file    Relationship status: Not on file  Other Topics Concern  . Not on file  Social History Narrative  . Not on file    Family History  Problem Relation Age of Onset  . Arthritis Mother   . Heart disease Mother   . Hypertension Mother   . Diabetes Mother   . Arthritis Father   . Heart disease Father   . Hypertension Father   . Diabetes Father   . Arthritis Maternal Grandmother   . Arthritis Maternal Grandfather   . Arthritis Paternal Grandmother   . Arthritis Paternal Grandfather     Review of Systems  Constitutional: Negative for chills and fever.  Respiratory: Negative for cough, shortness of breath and wheezing.   Cardiovascular: Positive for chest pain (soreness with certain movements, sore to touch) and leg swelling. Negative for palpitations.  Neurological: Negative for light-headedness and headaches.  Psychiatric/Behavioral: Negative for dysphoric mood and sleep disturbance. The patient is nervous/anxious (has not needed anything).         Objective:   Vitals:   04/23/19 0900  BP: (!) 158/90  Pulse: 80  Resp: 18  Temp: 98.4 F (36.9 C)  SpO2: 99%   BP Readings from Last 3 Encounters:  04/23/19 (!) 158/90  09/18/18 (!) 138/92  06/10/18 (!) 144/88   Wt Readings from Last 3 Encounters:  04/23/19 286 lb (129.7 kg)  09/18/18 282 lb (127.9 kg)  06/10/18 277 lb 12.8 oz (126 kg)   Body mass index is 41.04 kg/m.   Physical Exam    Constitutional: Appears well-developed and well-nourished. No distress.  HENT:  Head: Normocephalic and atraumatic.  Neck: Neck supple. No tracheal deviation present. No thyromegaly present.  No cervical lymphadenopathy Cardiovascular: Normal rate, regular rhythm and normal heart sounds.   No murmur heard. No carotid bruit .  Mild LLE edema, trace RLE edema Pulmonary/Chest: Effort normal and breath sounds normal. No respiratory distress. No has no wheezes. No rales.  Skin: Skin is warm and dry. Not diaphoretic.  Psychiatric: Normal mood and affect. Behavior is normal.      Assessment & Plan:    See Problem List for Assessment and Plan of chronic medical problems.

## 2019-04-22 NOTE — Patient Instructions (Addendum)
  Tests ordered today. Your results will be released to [facility] (or called to you) after review.  If any changes need to be made, you will be notified at that same time.   Medications reviewed and updated.  Changes include :   Increase the hydrochlorothiazide to 25 mg daily.  Your prescription(s) have been submitted to your pharmacy. Please take as directed and contact our office if you believe you are having problem(s) with the medication(s).   Work on weight loss.   Start using compression socks.  Light compression 10-15 mm Hg or Medium compression 20-30 mm Hg.  Exercise regularly.     Please followup in 2 months

## 2019-04-23 ENCOUNTER — Other Ambulatory Visit: Payer: Self-pay

## 2019-04-23 ENCOUNTER — Ambulatory Visit (INDEPENDENT_AMBULATORY_CARE_PROVIDER_SITE_OTHER): Payer: 59 | Admitting: Internal Medicine

## 2019-04-23 ENCOUNTER — Encounter: Payer: Self-pay | Admitting: Internal Medicine

## 2019-04-23 ENCOUNTER — Other Ambulatory Visit (INDEPENDENT_AMBULATORY_CARE_PROVIDER_SITE_OTHER): Payer: 59

## 2019-04-23 VITALS — BP 158/90 | HR 80 | Temp 98.4°F | Resp 18 | Ht 70.0 in | Wt 286.0 lb

## 2019-04-23 DIAGNOSIS — G4709 Other insomnia: Secondary | ICD-10-CM

## 2019-04-23 DIAGNOSIS — M25471 Effusion, right ankle: Secondary | ICD-10-CM

## 2019-04-23 DIAGNOSIS — I10 Essential (primary) hypertension: Secondary | ICD-10-CM

## 2019-04-23 DIAGNOSIS — K219 Gastro-esophageal reflux disease without esophagitis: Secondary | ICD-10-CM | POA: Diagnosis not present

## 2019-04-23 DIAGNOSIS — M25472 Effusion, left ankle: Secondary | ICD-10-CM

## 2019-04-23 DIAGNOSIS — R7303 Prediabetes: Secondary | ICD-10-CM | POA: Diagnosis not present

## 2019-04-23 DIAGNOSIS — J45909 Unspecified asthma, uncomplicated: Secondary | ICD-10-CM

## 2019-04-23 LAB — COMPREHENSIVE METABOLIC PANEL
ALT: 14 U/L (ref 0–35)
AST: 13 U/L (ref 0–37)
Albumin: 3.9 g/dL (ref 3.5–5.2)
Alkaline Phosphatase: 93 U/L (ref 39–117)
BUN: 16 mg/dL (ref 6–23)
CO2: 30 mEq/L (ref 19–32)
Calcium: 9.2 mg/dL (ref 8.4–10.5)
Chloride: 101 mEq/L (ref 96–112)
Creatinine, Ser: 0.52 mg/dL (ref 0.40–1.20)
GFR: 149.61 mL/min (ref >60.00)
Glucose, Bld: 81 mg/dL (ref 70–99)
Potassium: 3.8 mEq/L (ref 3.5–5.1)
Sodium: 139 mEq/L (ref 135–145)
Total Bilirubin: 0.5 mg/dL (ref 0.2–1.2)
Total Protein: 8 g/dL (ref 6.0–8.3)

## 2019-04-23 LAB — CBC WITH DIFFERENTIAL/PLATELET
Basophils Absolute: 0 10*3/uL (ref 0.0–0.1)
Basophils Relative: 0.5 % (ref 0.0–3.0)
Eosinophils Absolute: 0.1 10*3/uL (ref 0.0–0.7)
Eosinophils Relative: 2.4 % (ref 0.0–5.0)
HCT: 41.6 % (ref 36.0–46.0)
Hemoglobin: 13.3 g/dL (ref 12.0–15.0)
Lymphocytes Relative: 26.6 % (ref 12.0–46.0)
Lymphs Abs: 1.2 10*3/uL (ref 0.7–4.0)
MCHC: 32 g/dL (ref 30.0–36.0)
MCV: 82.6 fl (ref 78.0–100.0)
Monocytes Absolute: 0.8 10*3/uL (ref 0.1–1.0)
Monocytes Relative: 18.8 % — ABNORMAL HIGH (ref 3.0–12.0)
Neutro Abs: 2.2 10*3/uL (ref 1.4–7.7)
Neutrophils Relative %: 51.7 % (ref 43.0–77.0)
Platelets: 230 10*3/uL (ref 150.0–400.0)
RBC: 5.04 Mil/uL (ref 3.87–5.11)
RDW: 15.4 % (ref 11.5–15.5)
WBC: 4.4 10*3/uL (ref 4.0–10.5)

## 2019-04-23 LAB — LIPID PANEL
Cholesterol: 203 mg/dL — ABNORMAL HIGH (ref 0–200)
HDL: 63.8 mg/dL (ref >39.00)
LDL Cholesterol: 122 mg/dL — ABNORMAL HIGH (ref 0–99)
NonHDL: 139.08
Total CHOL/HDL Ratio: 3
Triglycerides: 84 mg/dL (ref 0.0–149.0)
VLDL: 16.8 mg/dL (ref 0.0–40.0)

## 2019-04-23 LAB — HEMOGLOBIN A1C: Hgb A1c MFr Bld: 6 % (ref 4.6–6.5)

## 2019-04-23 LAB — TSH: TSH: 1.44 u[IU]/mL (ref 0.35–4.50)

## 2019-04-23 MED ORDER — HYDROCHLOROTHIAZIDE 25 MG PO TABS: 25.0000 mg | ORAL_TABLET | Freq: Every day | ORAL | 3 refills | Status: DC

## 2019-04-23 MED FILL — HYDROCHLOROTHIAZIDE 25 MG T: 25 | 90 days supply | Qty: 90 | Fill #0

## 2019-04-23 NOTE — Assessment & Plan Note (Signed)
Continue singulair Controlled, stable

## 2019-04-23 NOTE — Assessment & Plan Note (Signed)
Was swollen for 3 weeks - ? Cause-has improved at this point and she is unsure why it has improved Stressed low-sodium diet Stressed regular exercise Stressed weight loss Advised compression socks daily since she stands so much-she will start with a light compression, which she may need more compliant with using Increase hydrochlorothiazide to 25 mg daily Follow-up in 2 months

## 2019-04-23 NOTE — Assessment & Plan Note (Addendum)
She take omeprazole otc controlled

## 2019-04-23 NOTE — Assessment & Plan Note (Signed)
With uncontrolled hypertension, prediabetes, GERD Stressed the importance of weight loss to help get her medical problems controlled and avoid future medication Stressed regular exercise Decrease portions, revise diet Follow-up in 2 months

## 2019-04-23 NOTE — Assessment & Plan Note (Signed)
Check a1c Low sugar / carb diet Stressed regular exercise   

## 2019-04-23 NOTE — Assessment & Plan Note (Signed)
Blood pressure not controlled Continue losartan 100 mg daily Increase hydrochlorothiazide to 25 mg daily Stressed regular exercise, weight loss, low sodium diet Follow up in 2 months Cmp, tsh, cbc

## 2019-04-23 NOTE — Assessment & Plan Note (Signed)
Has been sleeping good and has not needed the Remeron

## 2019-04-24 ENCOUNTER — Encounter: Payer: Self-pay | Admitting: Internal Medicine

## 2019-05-12 ENCOUNTER — Other Ambulatory Visit: Payer: Self-pay | Admitting: Internal Medicine

## 2019-05-12 MED FILL — MONTELUKAST SOD 10 MG TAB: 10 | 30 days supply | Qty: 30 | Fill #0

## 2019-05-12 MED FILL — DICLOFENAC SODIUM 75 MG TAB: 75 | 30 days supply | Qty: 60 | Fill #2

## 2019-06-07 ENCOUNTER — Telehealth: Payer: Self-pay | Admitting: Internal Medicine

## 2019-06-07 NOTE — Telephone Encounter (Signed)
Do you prefer appointment?

## 2019-06-07 NOTE — Telephone Encounter (Signed)
LVM for patient, informing her that she would need to make an appointment. To please call and set one up.

## 2019-06-07 NOTE — Telephone Encounter (Signed)
Needs appointment

## 2019-06-07 NOTE — Telephone Encounter (Signed)
yes

## 2019-06-07 NOTE — Telephone Encounter (Signed)
°  Relation to [id number]  Call back number: home # [phone] Pharmacy:  Reason for call:  Patient requesting urine orders due to frequent urination,burning and lower back pain. Patient declined appointment at this time, please advise

## 2019-06-09 ENCOUNTER — Other Ambulatory Visit: Payer: Self-pay | Admitting: Internal Medicine

## 2019-06-09 MED FILL — DICLOFENAC SODIUM 75 MG TAB: 75 | 30 days supply | Qty: 60 | Fill #3

## 2019-06-09 MED FILL — MONTELUKAST SOD 10 MG TAB: 10 | 30 days supply | Qty: 30 | Fill #0

## 2019-06-10 ENCOUNTER — Other Ambulatory Visit: Payer: Self-pay | Admitting: Internal Medicine

## 2019-06-10 DIAGNOSIS — M170 Bilateral primary osteoarthritis of knee: Secondary | ICD-10-CM | POA: Diagnosis not present

## 2019-06-10 MED FILL — LANSOPRAZOLE DR 30 MG CAPSU: 30 | 90 days supply | Qty: 90 | Fill #0

## 2019-06-21 NOTE — Progress Notes (Signed)
Subjective:    Patient ID: Dawn Townsend, female    DOB: Oct 09, 1965, 53 y.o.   MRN: [id number]  HPI The patient is here for follow up.  Hypertension: we increased her hctz 2 months ago.  She has been taking that daily as prescribed.  She thought she was supposed to discontinue the losartan and has not been taking that.  She has revised her diet and has lost some weight.   She is compliant with a low sodium diet.  She denies chest pain, palpitations, edema, shortness of breath and regular headaches. She does monitor her blood pressure at home-1 week ago it was fairly similar to what it is here today.  Her only complaint is that the hydrochlorothiazide increases her urination significantly.  Because she is not able to urinate frequently during the day she takes the medication in the evening and she is not sleeping well..    Leg edema:  We increased her hctz two months ago.  I advised she wear compression socks.  She is wearing the compression socks.  She feels her leg swelling has resolved.   ? uti: For the past couple of weeks she has had urinary symptoms and was concerned that she may have a UTI.  She states dysuria, urinary urgency and some lower back discomfort, which is new for her.  She denies any increase in urine frequency, blood in the urine or urine odor.  She has not had any lower abdominal pain, nausea or fever.   Medications and allergies reviewed with patient and updated if appropriate.  Patient Active Problem List   Diagnosis Date Noted  . Hair loss 09/18/2018  . Bumps on skin 09/18/2018  . Seborrheic dermatitis of scalp 06/10/2018  . Cardiac murmur 02/27/2018  . Ankle edema, bilateral 02/27/2018  . 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 Prediabetes 10/15/2016  . Asthma 06/27/2016  . GERD (gastroesophageal reflux disease) 06/27/2016  . Uncontrolled hypertension 06/27/2016  . Morbid obesity ([place]) 06/27/2016  . Family history of diabetes mellitus (DM) 06/27/2016  . Eczema  06/27/2016  . Chronic lower back pain 06/27/2016  . Bilateral knee pain 06/27/2016    Current Outpatient Medications on File Prior to Visit  Medication Sig Dispense Refill  . Albuterol Sulfate (PROAIR RESPICLICK) 123XX123 (90 Base) MCG/ACT AEPB Inhale 2 puffs into the lungs every 4 (four) hours as needed (wheeze). 1 each 8  . Cholecalciferol (VITAMIN D PO) Take by mouth.    . diclofenac (VOLTAREN) 75 MG EC tablet   0  . hydrochlorothiazide (HYDRODIURIL) 25 MG tablet Take 1 tablet (25 mg total) by mouth daily. 90 tablet 3  . lansoprazole (PREVACID) 30 MG capsule TAKE 1 CAPSULE (30 MG TOTAL) BY MOUTH DAILY BEFORE BREAKFAST. 90 capsule 0  . montelukast (SINGULAIR) 10 MG tablet Take 1 tablet (10 mg total) by mouth at bedtime. 30 tablet 1  . triamcinolone cream (KENALOG) 0.5 % Apply 1 application topically 3 (three) times daily. 30 g 2   No current facility-administered medications on file prior to visit.     Past Medical History:  Diagnosis Date  . Arthritis    knee   . Asthma    no inhaler  . Fibroids   . GERD (gastroesophageal reflux disease)   . Hyperlipidemia   . Hypertension   . SVD (spontaneous vaginal delivery)    x 1  . Tendonitis of foot     Past Surgical History:  Procedure Laterality Date  . ABDOMINAL HYSTERECTOMY Bilateral 06/08/2014  Procedure: HYSTERECTOMY ABDOMINAL/BILATERAL SALPINGECTOMY/POSSIBLE BILATERAL SALPINGO OOPHORECTOMY;  Surgeon: Daria Pastures, MD;  Location: [facility];  Service: Gynecology;  Laterality: Bilateral;  POSSIBLE STAGING  . BILATERAL SALPINGECTOMY Right 06/08/2014   Procedure: BILATERAL SALPINGECTOMY;  Surgeon: Daria Pastures, MD;  Location: [facility];  Service: Gynecology;  Laterality: Right;  . CHOLECYSTECTOMY  2000  . OOPHORECTOMY Left 06/08/2014   Procedure: OOPHORECTOMY;  Surgeon: Daria Pastures, MD;  Location: [facility];  Service: Gynecology;  Laterality: Left;  . WISDOM TOOTH EXTRACTION      Social History   Socioeconomic History  .  Marital status: Single    Spouse name: Not on file  . Number of children: Not on file  . Years of education: Not on file  . Highest education level: Not on file  Occupational History  . Not on file  Social Needs  . Financial resource strain: Not on file  . Food insecurity    Worry: Not on file    Inability: Not on file  . Transportation needs    Medical: Not on file    Non-medical: Not on file  Tobacco Use  . Smoking status: Never Smoker  . Smokeless tobacco: Never Used  Substance and Sexual Activity  . Alcohol use: No  . Drug use: No  . Sexual activity: Not Currently    Birth control/protection: None  Lifestyle  . Physical activity    Days per week: Not on file    Minutes per session: Not on file  . Stress: Not on file  Relationships  . Social Herbalist on phone: Not on file    Gets together: Not on file    Attends religious service: Not on file    Active member of club or organization: Not on file    Attends meetings of clubs or organizations: Not on file    Relationship status: Not on file  Other Topics Concern  . Not on file  Social History Narrative  . Not on file    Family History  Problem Relation Age of Onset  . Arthritis Mother   . Heart disease Mother   . Hypertension Mother   . Diabetes Mother   . Arthritis Father   . Heart disease Father   . Hypertension Father   . Diabetes Father   . Arthritis Maternal Grandmother   . Arthritis Maternal Grandfather   . Arthritis Paternal Grandmother   . Arthritis Paternal Grandfather     Review of Systems  Constitutional: Negative for fever.  Respiratory: Negative for shortness of breath.   Cardiovascular: Negative for chest pain, palpitations and leg swelling.  Gastrointestinal: Negative for abdominal pain (sometimes bladder pain) and nausea.  Genitourinary: Positive for dysuria and urgency. Negative for frequency and hematuria.       No urine odor  Musculoskeletal: Positive for back pain  (lower back pain).  Neurological: Negative for light-headedness and headaches.       Objective:   Vitals:   06/22/19 1045  BP: 132/80  Pulse: 77  Resp: 16  Temp: 98.2 F (36.8 C)  SpO2: 99%   BP Readings from Last 3 Encounters:  06/22/19 132/80  04/23/19 (!) 158/90  09/18/18 (!) 138/92   Wt Readings from Last 3 Encounters:  06/22/19 281 lb (127.5 kg)  04/23/19 286 lb (129.7 kg)  09/18/18 282 lb (127.9 kg)   Body mass index is 40.32 kg/m.   Physical Exam    Constitutional: Appears well-developed and well-nourished. No distress.  HENT:  Head: Normocephalic and atraumatic.  Neck: Neck supple. No tracheal deviation present. No thyromegaly present.  No cervical lymphadenopathy Cardiovascular: Normal rate, regular rhythm and normal heart sounds.   No murmur heard. No carotid bruit .  No edema Pulmonary/Chest: Effort normal and breath sounds normal. No respiratory distress. No has no wheezes. No rales. Abdomen: Soft, nontender, nondistended GU: No CVA tenderness bilaterally Skin: Skin is warm and dry. Not diaphoretic.  Psychiatric: Normal mood and affect. Behavior is normal.      Assessment & Plan:    See Problem List for Assessment and Plan of chronic medical problems.

## 2019-06-22 ENCOUNTER — Encounter: Payer: Self-pay | Admitting: Internal Medicine

## 2019-06-22 ENCOUNTER — Other Ambulatory Visit (INDEPENDENT_AMBULATORY_CARE_PROVIDER_SITE_OTHER): Payer: 59

## 2019-06-22 ENCOUNTER — Other Ambulatory Visit: Payer: Self-pay

## 2019-06-22 ENCOUNTER — Ambulatory Visit (INDEPENDENT_AMBULATORY_CARE_PROVIDER_SITE_OTHER): Payer: 59 | Admitting: Internal Medicine

## 2019-06-22 VITALS — BP 132/80 | HR 77 | Temp 98.2°F | Resp 16 | Ht 70.0 in | Wt 281.0 lb

## 2019-06-22 DIAGNOSIS — M25471 Effusion, right ankle: Secondary | ICD-10-CM

## 2019-06-22 DIAGNOSIS — I10 Essential (primary) hypertension: Secondary | ICD-10-CM

## 2019-06-22 DIAGNOSIS — M25472 Effusion, left ankle: Secondary | ICD-10-CM | POA: Diagnosis not present

## 2019-06-22 DIAGNOSIS — R300 Dysuria: Secondary | ICD-10-CM

## 2019-06-22 DIAGNOSIS — Z23 Encounter for immunization: Secondary | ICD-10-CM

## 2019-06-22 LAB — URINALYSIS, ROUTINE W REFLEX MICROSCOPIC
Bilirubin Urine: NEGATIVE
Hgb urine dipstick: NEGATIVE
Leukocytes,Ua: NEGATIVE
Nitrite: NEGATIVE
Specific Gravity, Urine: 1.02 (ref 1.000–1.030)
Total Protein, Urine: NEGATIVE
Urine Glucose: NEGATIVE
Urobilinogen, UA: 4 — AB (ref 0.0–1.0)
pH: 7 (ref 5.0–8.0)

## 2019-06-22 MED ORDER — HYDROCHLOROTHIAZIDE 12.5 MG PO CAPS: 12.5000 mg | ORAL_CAPSULE | Freq: Every day | ORAL | 5 refills | Status: DC

## 2019-06-22 MED ORDER — MONTELUKAST SODIUM 10 MG PO TABS: 10.0000 mg | ORAL_TABLET | Freq: Every day | ORAL | 1 refills | Status: DC

## 2019-06-22 MED ORDER — NEBIVOLOL HCL 5 MG PO TABS: 5.0000 mg | ORAL_TABLET | Freq: Every day | ORAL | 5 refills | Status: DC

## 2019-06-22 MED ORDER — LANSOPRAZOLE 30 MG PO CPDR: 30.0000 mg | DELAYED_RELEASE_CAPSULE | Freq: Every day | ORAL | 1 refills | Status: DC

## 2019-06-22 MED FILL — BYSTOLIC 5 MG TABLET: 5 | 30 days supply | Qty: 30 | Fill #0

## 2019-06-22 MED FILL — HYDROCHLOROTHIAZIDE 12.5 MG: 12.5 | 30 days supply | Qty: 30 | Fill #0

## 2019-06-22 NOTE — Assessment & Plan Note (Signed)
Possible UTI Will check urinalysis, urine culture If UTI will treat with antibiotics Discontinuing the hydrochlorothiazide may also help so we may need to reevaluate after she is off that medication if it is not a UTI

## 2019-06-22 NOTE — Assessment & Plan Note (Signed)
Improved with hydrochlorothiazide 25 mg daily and wearing compression socks She is also not drinking any soda and has lost a few pounds Continue compression socks, weight loss efforts and dietary changes We will decrease hydrochlorothiazide to 12.5 mg daily because of excessive urination She will let me know if her swelling recurs

## 2019-06-22 NOTE — Patient Instructions (Addendum)
  Give a urine sample at the lab.    Flu immunization administered today.    Medications reviewed and updated.  Changes include :   Decrease hctz to 12.5 mg (water pill).  Start bystolic 5 mg daily ( for blood pressure)  Your prescription(s) have been submitted to your pharmacy. Please take as directed and contact our office if you believe you are having problem(s) with the medication(s).   Please followup in 6 months

## 2019-06-22 NOTE — Assessment & Plan Note (Signed)
Blood pressure looks good here today on 25 mg of hydrochlorothiazide alone, but it is increasing in urination too much and she needs to take in the evening and is not sleeping We will decrease hydrochlorothiazide to 12.5 mg daily We will try Bystolic 5 mg daily if this is covered by her insurance She will monitor her blood pressure at home and make sure it is controlled Follow-up in 6 months, sooner if blood pressures not well controlled

## 2019-06-24 ENCOUNTER — Encounter: Payer: Self-pay | Admitting: Internal Medicine

## 2019-06-24 LAB — URINE CULTURE
MICRO NUMBER:: 959188
SPECIMEN QUALITY:: ADEQUATE

## 2019-07-16 MED FILL — DICLOFENAC SODIUM 75 MG TAB: 75 | 30 days supply | Qty: 60 | Fill #4

## 2019-07-16 MED FILL — MONTELUKAST SOD 10 MG TAB: 10 | 30 days supply | Qty: 30 | Fill #1

## 2019-08-17 MED FILL — MONTELUKAST SOD 10 MG TAB: 10 | 90 days supply | Qty: 90 | Fill #0

## 2019-08-17 MED FILL — HYDROCHLOROTHIAZIDE 25 MG T: 25 | 90 days supply | Qty: 90 | Fill #1

## 2019-08-17 MED FILL — DICLOFENAC SODIUM 75 MG TAB: 75 | 30 days supply | Qty: 60 | Fill #5

## 2019-09-14 DIAGNOSIS — M170 Bilateral primary osteoarthritis of knee: Secondary | ICD-10-CM | POA: Diagnosis not present

## 2019-09-20 MED FILL — LANSOPRAZOLE DR 30 MG CAPSU: 30 | 90 days supply | Qty: 90 | Fill #0

## 2019-09-22 MED FILL — DICLOFENAC SODIUM 75 MG TAB: 75 | 30 days supply | Qty: 60 | Fill #0

## 2019-10-25 MED FILL — DICLOFENAC SOD EC 75 MG TAB: 75 | 30 days supply | Qty: 60 | Fill #1

## 2019-11-23 MED FILL — MONTELUKAST SOD 10 MG TAB: 10 | 90 days supply | Qty: 90 | Fill #1

## 2019-11-23 MED FILL — DICLOFENAC SOD EC 75 MG TAB: 75 | 30 days supply | Qty: 60 | Fill #2

## 2019-12-21 ENCOUNTER — Ambulatory Visit: Payer: 59 | Admitting: Internal Medicine

## 2019-12-30 MED FILL — LANSOPRAZOLE DR 30 MG CAPSU: 30 | 90 days supply | Qty: 90 | Fill #1

## 2019-12-30 MED FILL — DICLOFENAC SOD EC 75 MG TAB: 75 | 30 days supply | Qty: 60 | Fill #3

## 2019-12-30 MED FILL — HYDROCHLOROTHIAZIDE 25 MG T: 25 | 90 days supply | Qty: 90 | Fill #2

## 2020-01-04 ENCOUNTER — Other Ambulatory Visit (HOSPITAL_COMMUNITY): Payer: Self-pay | Admitting: Sports Medicine

## 2020-01-04 ENCOUNTER — Ambulatory Visit: Payer: 59 | Admitting: Internal Medicine

## 2020-01-04 DIAGNOSIS — M170 Bilateral primary osteoarthritis of knee: Secondary | ICD-10-CM | POA: Diagnosis not present

## 2020-01-31 NOTE — Progress Notes (Signed)
Subjective:    Patient ID: Dawn Townsend, female    DOB: January 13, 1966, 54 y.o.   MRN: [id number]  HPI  She is here for a physical exam and for follow up of their chronic medical problems, including htn, leg edema, prediabetes, gerd, asthma  She is taking all of her medications as prescribed.   She is not exercising regularly.   Her eating has not been as good.    Her arthritis is acting up and she has pain in both knees and her back.  She goes for injections next week.     Medications and allergies reviewed with patient and updated if appropriate.  Patient Active Problem List   Diagnosis Date Noted  . Hair loss 09/18/2018  . Bumps on skin 09/18/2018  . Seborrheic dermatitis of scalp 06/10/2018  . Cardiac murmur 02/27/2018  . Ankle edema, bilateral 02/27/2018  . 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 Prediabetes 10/15/2016  . Asthma 06/27/2016  . GERD (gastroesophageal reflux disease) 06/27/2016  . Uncontrolled hypertension 06/27/2016  . Dysuria 06/27/2016  . Morbid obesity ([place]) 06/27/2016  . Family history of diabetes mellitus (DM) 06/27/2016  . Eczema 06/27/2016  . Chronic lower back pain 06/27/2016  . Bilateral knee pain 06/27/2016    Current Outpatient Medications on File Prior to Visit  Medication Sig Dispense Refill  . Cholecalciferol (VITAMIN D PO) Take by mouth.    . diclofenac (VOLTAREN) 75 MG EC tablet   0  . hydrochlorothiazide (MICROZIDE) 12.5 MG capsule Take 1 capsule (12.5 mg total) by mouth daily. 30 capsule 5  . lansoprazole (PREVACID) 30 MG capsule Take 1 capsule (30 mg total) by mouth daily before breakfast. 90 capsule 1  . montelukast (SINGULAIR) 10 MG tablet Take 1 tablet (10 mg total) by mouth at bedtime. 90 tablet 1  . nebivolol (BYSTOLIC) 5 MG tablet Take 1 tablet (5 mg total) by mouth daily. 30 tablet 5  . triamcinolone cream (KENALOG) 0.5 % Apply 1 application topically 3 (three) times daily. 30 g 2   No current facility-administered medications on  file prior to visit.    Past Medical History:  Diagnosis Date  . Arthritis    knee   . Asthma    no inhaler  . Fibroids   . GERD (gastroesophageal reflux disease)   . Hyperlipidemia   . Hypertension   . SVD (spontaneous vaginal delivery)    x 1  . Tendonitis of foot     Past Surgical History:  Procedure Laterality Date  . ABDOMINAL HYSTERECTOMY Bilateral 06/08/2014   Procedure: HYSTERECTOMY ABDOMINAL/BILATERAL SALPINGECTOMY/POSSIBLE BILATERAL SALPINGO OOPHORECTOMY;  Surgeon: Daria Pastures, MD;  Location: [facility];  Service: Gynecology;  Laterality: Bilateral;  POSSIBLE STAGING  . BILATERAL SALPINGECTOMY Right 06/08/2014   Procedure: BILATERAL SALPINGECTOMY;  Surgeon: Daria Pastures, MD;  Location: [facility];  Service: Gynecology;  Laterality: Right;  . CHOLECYSTECTOMY  2000  . OOPHORECTOMY Left 06/08/2014   Procedure: OOPHORECTOMY;  Surgeon: Daria Pastures, MD;  Location: [facility];  Service: Gynecology;  Laterality: Left;  . WISDOM TOOTH EXTRACTION      Social History   Socioeconomic History  . Marital status: Single    Spouse name: Not on file  . Number of children: Not on file  . Years of education: Not on file  . Highest education level: Not on file  Occupational History  . Not on file  Tobacco Use  . Smoking status: Never Smoker  . Smokeless tobacco: Never Used  Substance and Sexual Activity  . Alcohol use: No  . Drug use: No  . Sexual activity: Not Currently    Birth control/protection: None  Other Topics Concern  . Not on file  Social History Narrative  . Not on file   Social Determinants of Health   Financial Resource Strain:   . Difficulty of Paying Living Expenses:   Food Insecurity:   . Worried About Charity fundraiser in the Last Year:   . Arboriculturist in the Last Year:   Transportation Needs:   . Film/video editor (Medical):   Marland Kitchen Lack of Transportation (Non-Medical):   Physical Activity:   . Days of Exercise per Week:   . Minutes  of Exercise per Session:   Stress:   . Feeling of Stress :   Social Connections:   . Frequency of Communication with Friends and Family:   . Frequency of Social Gatherings with Friends and Family:   . Attends Religious Services:   . Active Member of Clubs or Organizations:   . Attends Archivist Meetings:   Marland Kitchen Marital Status:     Family History  Problem Relation Age of Onset  . Arthritis Mother   . Heart disease Mother   . Hypertension Mother   . Diabetes Mother   . Arthritis Father   . Heart disease Father   . Hypertension Father   . Diabetes Father   . Arthritis Maternal Grandmother   . Arthritis Maternal Grandfather   . Arthritis Paternal Grandmother   . Arthritis Paternal Grandfather     Review of Systems  Constitutional: Negative for chills and fever.  Eyes: Negative for visual disturbance.  Respiratory: Positive for shortness of breath (when walking with mask on). Negative for cough and wheezing.   Cardiovascular: Positive for palpitations (a little) and leg swelling (occ). Negative for chest pain.  Gastrointestinal: Positive for nausea (occ from certain food). Negative for abdominal pain, blood in stool, constipation and diarrhea.  Genitourinary: Positive for dysuria (chronic). Negative for hematuria.  Musculoskeletal: Positive for arthralgias and back pain.  Skin: Negative for rash.  Neurological: Positive for light-headedness and headaches.  Psychiatric/Behavioral: Positive for dysphoric mood (concern over her health). The patient is nervous/anxious (concern over her health).        Objective:   Vitals:   02/01/20 1104  BP: (!) 164/100  Pulse: 82  Resp: 18  Temp: 98.3 F (36.8 C)  SpO2: 99%   BP Readings from Last 3 Encounters:  02/01/20 (!) 164/100  06/22/19 132/80  04/23/19 (!) 158/90   Wt Readings from Last 3 Encounters:  02/01/20 291 lb (132 kg)  06/22/19 281 lb (127.5 kg)  04/23/19 286 lb (129.7 kg)   Body mass index is 41.75  kg/m.   Physical Exam    Constitutional: She appears well-developed and well-nourished. No distress.  HENT:  Head: Normocephalic and atraumatic.  Right Ear: External ear normal. Normal ear canal and TM Left Ear: External ear normal.  Normal ear canal and TM Mouth/Throat: Oropharynx is clear and moist.  Eyes: Conjunctivae and EOM are normal.  Neck: Neck supple. No tracheal deviation present. No thyromegaly present.  No carotid bruit  Cardiovascular: Normal rate, regular rhythm and normal heart sounds.   No murmur heard.  No edema. Pulmonary/Chest: Effort normal and breath sounds normal. No respiratory distress. She has no wheezes. She has no rales.  Breast: deferred   Abdominal: Soft. She exhibits no distension. There is no tenderness.  Lymphadenopathy: She has no cervical adenopathy.  Skin: Skin is warm and dry. She is not diaphoretic.  Psychiatric: She has a normal mood and affect. Her behavior is normal.     Assessment & Plan:    Physical exam: Screening blood work Immunizations  Dicussed covid vaccine and shingrix Colonoscopy deferred Mammogram  will schedule Gyn   Will schedule Eye exams   Up to date  Exercise  None- due to knee osteoarthritis Weight    Stressed weight loss Substance abuse none   See Problem List for Assessment and Plan of chronic medical problems.    This visit occurred during the SARS-CoV-2 public health emergency.  Safety protocols were in place, including screening questions prior to the visit, additional usage of staff PPE, and extensive cleaning of exam room while observing appropriate contact time as indicated for disinfecting solutions.

## 2020-01-31 NOTE — Patient Instructions (Addendum)
Blood work was ordered.  Have it done at [facility].    Medications reviewed and updated.  Changes include :   bystolic for your BP.  An albuterol inhaler was sent.   Your prescription(s) have been submitted to your pharmacy. Please take as directed and contact our office if you believe you are having problem(s) with the medication(s).    Please followup in 6 months     Health Maintenance, Female Adopting a healthy lifestyle and getting preventive care are important in promoting health and wellness. Ask your health care provider about:  The right schedule for you to have regular tests and exams.  Things you can do on your own to prevent diseases and keep yourself healthy. What should I know about diet, weight, and exercise? Eat a healthy diet   Eat a diet that includes plenty of vegetables, fruits, low-fat dairy products, and lean protein.  Do not eat a lot of foods that are high in solid fats, added sugars, or sodium. Maintain a healthy weight Body mass index (BMI) is used to identify weight problems. It estimates body fat based on height and weight. Your health care provider can help determine your BMI and help you achieve or maintain a healthy weight. Get regular exercise Get regular exercise. This is one of the most important things you can do for your health. Most adults should:  Exercise for at least 150 minutes each week. The exercise should increase your heart rate and make you sweat (moderate-intensity exercise).  Do strengthening exercises at least twice a week. This is in addition to the moderate-intensity exercise.  Spend less time sitting. Even light physical activity can be beneficial. Watch cholesterol and blood lipids Have your blood tested for lipids and cholesterol at 53 years of age, then have this test every 5 years. Have your cholesterol levels checked more often if:  Your lipid or cholesterol levels are high.  You are older than 53 years of  age.  You are at high risk for heart disease. What should I know about cancer screening? Depending on your health history and family history, you may need to have cancer screening at various ages. This may include screening for:  Breast cancer.  Cervical cancer.  Colorectal cancer.  Skin cancer.  Lung cancer. What should I know about heart disease, diabetes, and high blood pressure? Blood pressure and heart disease  High blood pressure causes heart disease and increases the risk of stroke. This is more likely to develop in people who have high blood pressure readings, are of African descent, or are overweight.  Have your blood pressure checked: ? Every 3-5 years if you are 78-55 years of age. ? Every year if you are 40 years old or older. Diabetes Have regular diabetes screenings. This checks your fasting blood sugar level. Have the screening done:  Once every three years after age 17 if you are at a normal weight and have a low risk for diabetes.  More often and at a younger age if you are overweight or have a high risk for diabetes. What should I know about preventing infection? Hepatitis B If you have a higher risk for hepatitis B, you should be screened for this virus. Talk with your health care provider to find out if you are at risk for hepatitis B infection. Hepatitis C Testing is recommended for:  Everyone born from [birth year] through 1964.  Anyone with known risk factors for hepatitis C. Sexually transmitted infections (STIs)  Get  screened for STIs, including gonorrhea and chlamydia, if: ? You are sexually active and are younger than 53 years of age. ? You are older than 53 years of age and your health care provider tells you that you are at risk for this type of infection. ? Your sexual activity has changed since you were last screened, and you are at increased risk for chlamydia or gonorrhea. Ask your health care provider if you are at risk.  Ask your health care  provider about whether you are at high risk for HIV. Your health care provider may recommend a prescription medicine to help prevent HIV infection. If you choose to take medicine to prevent HIV, you should first get tested for HIV. You should then be tested every 3 months for as long as you are taking the medicine. Pregnancy  If you are about to stop having your period (premenopausal) and you may become pregnant, seek counseling before you get pregnant.  Take 400 to 800 micrograms (mcg) of folic acid every day if you become pregnant.  Ask for birth control (contraception) if you want to prevent pregnancy. Osteoporosis and menopause Osteoporosis is a disease in which the bones lose minerals and strength with aging. This can result in bone fractures. If you are 33 years old or older, or if you are at risk for osteoporosis and fractures, ask your health care provider if you should:  Be screened for bone loss.  Take a calcium or vitamin D supplement to lower your risk of fractures.  Be given hormone replacement therapy (HRT) to treat symptoms of menopause. Follow these instructions at home: Lifestyle  Do not use any products that contain nicotine or tobacco, such as cigarettes, e-cigarettes, and chewing tobacco. If you need help quitting, ask your health care provider.  Do not use street drugs.  Do not share needles.  Ask your health care provider for help if you need support or information about quitting drugs. Alcohol use  Do not drink alcohol if: ? Your health care provider tells you not to drink. ? You are pregnant, may be pregnant, or are planning to become pregnant.  If you drink alcohol: ? Limit how much you use to 0-1 drink a day. ? Limit intake if you are breastfeeding.  Be aware of how much alcohol is in your drink. In the [place] one drink equals one 12 oz bottle of beer (355 mL), one 5 oz glass of wine (148 mL), or one 1 oz glass of hard liquor (44 mL). General  instructions  Schedule regular health, dental, and eye exams.  Stay current with your vaccines.  Tell your health care provider if: ? You often feel depressed. ? You have ever been abused or do not feel safe at home. Summary  Adopting a healthy lifestyle and getting preventive care are important in promoting health and wellness.  Follow your health care provider's instructions about healthy diet, exercising, and getting tested or screened for diseases.  Follow your health care provider's instructions on monitoring your cholesterol and blood pressure. This information is not intended to replace advice given to you by your health care provider. Make sure you discuss any questions you have with your health care provider. Document Revised: 08/26/2018 Document Reviewed: 08/26/2018 Elsevier Patient Education  2020 [employer].

## 2020-02-01 ENCOUNTER — Other Ambulatory Visit: Payer: Self-pay | Admitting: Internal Medicine

## 2020-02-01 ENCOUNTER — Other Ambulatory Visit: Payer: Self-pay

## 2020-02-01 ENCOUNTER — Encounter: Payer: Self-pay | Admitting: Internal Medicine

## 2020-02-01 ENCOUNTER — Ambulatory Visit: Payer: 59 | Admitting: Internal Medicine

## 2020-02-01 VITALS — BP 164/100 | HR 82 | Temp 98.3°F | Resp 18 | Ht 70.0 in | Wt 291.0 lb

## 2020-02-01 DIAGNOSIS — J45909 Unspecified asthma, uncomplicated: Secondary | ICD-10-CM | POA: Diagnosis not present

## 2020-02-01 DIAGNOSIS — R7303 Prediabetes: Secondary | ICD-10-CM

## 2020-02-01 DIAGNOSIS — K219 Gastro-esophageal reflux disease without esophagitis: Secondary | ICD-10-CM | POA: Diagnosis not present

## 2020-02-01 DIAGNOSIS — M25562 Pain in left knee: Secondary | ICD-10-CM

## 2020-02-01 DIAGNOSIS — Z0000 Encounter for general adult medical examination without abnormal findings: Secondary | ICD-10-CM | POA: Diagnosis not present

## 2020-02-01 DIAGNOSIS — G8929 Other chronic pain: Secondary | ICD-10-CM

## 2020-02-01 DIAGNOSIS — I10 Essential (primary) hypertension: Secondary | ICD-10-CM

## 2020-02-01 DIAGNOSIS — M25471 Effusion, right ankle: Secondary | ICD-10-CM | POA: Diagnosis not present

## 2020-02-01 DIAGNOSIS — M25561 Pain in right knee: Secondary | ICD-10-CM | POA: Diagnosis not present

## 2020-02-01 DIAGNOSIS — M25472 Effusion, left ankle: Secondary | ICD-10-CM | POA: Diagnosis not present

## 2020-02-01 MED ORDER — MONTELUKAST SODIUM 10 MG PO TABS: 10.0000 mg | ORAL_TABLET | Freq: Every day | ORAL | 1 refills | Status: DC

## 2020-02-01 MED ORDER — PROAIR RESPICLICK 108 (90 BASE) MCG/ACT IN AEPB: 2.0000 | INHALATION_SPRAY | RESPIRATORY_TRACT | 8 refills | Status: DC | PRN

## 2020-02-01 MED ORDER — LANSOPRAZOLE 30 MG PO CPDR: 30.0000 mg | DELAYED_RELEASE_CAPSULE | Freq: Every day | ORAL | 1 refills | Status: DC

## 2020-02-01 MED ORDER — NEBIVOLOL HCL 5 MG PO TABS: 5.0000 mg | ORAL_TABLET | Freq: Every day | ORAL | 5 refills | Status: DC

## 2020-02-01 MED FILL — BYSTOLIC 5 MG TABLET: 5 | 30 days supply | Qty: 30 | Fill #0

## 2020-02-01 MED FILL — PROAIR RESPICLICK INHAL PWD: 108 (90 BAS | 16 days supply | Qty: 1 | Fill #0

## 2020-02-01 MED FILL — DICLOFENAC SOD EC 75 MG TAB: 75 | 30 days supply | Qty: 60 | Fill #4

## 2020-02-01 NOTE — Assessment & Plan Note (Signed)
Chronic Blood pressure-not controlled,part of this could be related to her pain but discussed with her that her blood pressure still is likely not ideally controlled She never started the Bystolic since last fall She is taking hydrochlorothiazide daily and will continue this Start Bystolic 5 mg daily-sent to pharmacy and advised her to let me know if this is not covered Stressed weight loss CMP

## 2020-02-01 NOTE — Assessment & Plan Note (Signed)
Chronic Following with orthopedics-Dr. Layne Benton Related steroid injections/gel injections Taking diclofenac Stressed weight loss

## 2020-02-01 NOTE — Assessment & Plan Note (Signed)
Mild, occasional Chronic Wears compression socks to work Continue hydrochlorothiazide 12.5 mg daily

## 2020-02-01 NOTE — Assessment & Plan Note (Signed)
Chronic Check a1c Low sugar / carb diet Stressed regular exercise Weight loss encouraged

## 2020-02-01 NOTE — Assessment & Plan Note (Signed)
Chronic Taking singulair nightly Continue above Albuterol prn

## 2020-02-01 NOTE — Assessment & Plan Note (Signed)
Chronic Stressed the importance of weight loss for multiple reasons Encouraged as much activity as possible Improve eating habits, decrease portions Follow-up in 6 months

## 2020-02-01 NOTE — Assessment & Plan Note (Signed)
Chronic GERD controlled Continue daily medication  

## 2020-02-03 MED FILL — MONTELUKAST SOD 10 MG TAB: 10 | 90 days supply | Qty: 90 | Fill #0

## 2020-02-09 DIAGNOSIS — M170 Bilateral primary osteoarthritis of knee: Secondary | ICD-10-CM | POA: Diagnosis not present

## 2020-02-09 MED FILL — predniSONE 10 MG TABS: 10 | 9 days supply | Qty: 18 | Fill #0

## 2020-02-15 DIAGNOSIS — M170 Bilateral primary osteoarthritis of knee: Secondary | ICD-10-CM | POA: Diagnosis not present

## 2020-02-23 DIAGNOSIS — M170 Bilateral primary osteoarthritis of knee: Secondary | ICD-10-CM | POA: Diagnosis not present

## 2020-03-13 MED FILL — DICLOFENAC SOD EC 75 MG TAB: 75 | 30 days supply | Qty: 60 | Fill #5

## 2020-03-13 MED FILL — BYSTOLIC 5 MG TABLET: 5 | 90 days supply | Qty: 90 | Fill #1

## 2020-03-13 MED FILL — HYDROCODON-APAP 5-325: 5-325 | 5 days supply | Qty: 10 | Fill #0

## 2020-04-11 ENCOUNTER — Other Ambulatory Visit: Payer: Self-pay | Admitting: Internal Medicine

## 2020-04-11 MED FILL — DICLOFENAC SOD EC 75 MG TAB: 75 | 30 days supply | Qty: 60 | Fill #0

## 2020-04-11 MED FILL — LANSOPRAZOLE DR 30 MG CAPSU: 30 | 90 days supply | Qty: 90 | Fill #0

## 2020-04-12 ENCOUNTER — Other Ambulatory Visit: Payer: Self-pay | Admitting: Internal Medicine

## 2020-05-19 ENCOUNTER — Telehealth: Payer: Self-pay | Admitting: Internal Medicine

## 2020-05-19 ENCOUNTER — Other Ambulatory Visit: Payer: Self-pay | Admitting: Internal Medicine

## 2020-05-19 ENCOUNTER — Telehealth: Payer: Self-pay

## 2020-05-19 MED ORDER — HYDROCHLOROTHIAZIDE 25 MG PO TABS
25.0000 mg | ORAL_TABLET | Freq: Every day | ORAL | 3 refills | Status: DC
Start: 2020-05-19 — End: 2020-05-19

## 2020-05-19 MED FILL — MONTELUKAST SOD 10 MG TAB: 10 | 90 days supply | Qty: 90 | Fill #1

## 2020-05-19 MED FILL — DICLOFENAC SOD EC 75 MG TAB: 75 | 30 days supply | Qty: 60 | Fill #1

## 2020-05-19 MED FILL — HYDROCHLOROTHIAZIDE 25 MG T: 25 | 90 days supply | Qty: 90 | Fill #0

## 2020-05-19 NOTE — Telephone Encounter (Signed)
Ok to take 25 mg daily -- rx change and sent to pharmacy

## 2020-05-19 NOTE — Telephone Encounter (Signed)
Left message for patient regarding medication being sent in.

## 2020-05-19 NOTE — Telephone Encounter (Signed)
    Please clarify dosage of hydrochlorothiazide (MICROZIDE)  Should patient be taking 25MG  or 12.5 MG

## 2020-05-19 NOTE — Telephone Encounter (Signed)
Left message for patient today to take 25 mg and rx sent to pharmacy by Dr. Quay Burow.

## 2020-06-22 MED FILL — DICLOFENAC SOD EC 75 MG TAB: 75 | 30 days supply | Qty: 60 | Fill #2

## 2020-07-18 ENCOUNTER — Other Ambulatory Visit (HOSPITAL_COMMUNITY): Payer: Self-pay | Admitting: Sports Medicine

## 2020-07-18 DIAGNOSIS — M170 Bilateral primary osteoarthritis of knee: Secondary | ICD-10-CM | POA: Diagnosis not present

## 2020-07-18 MED FILL — HYDROCODON-APAP 5-325: 5-325 | 5 days supply | Qty: 10 | Fill #0

## 2020-07-18 MED FILL — DICLOFENAC SOD EC 75 MG TAB: 75 | 30 days supply | Qty: 60 | Fill #0

## 2020-07-19 MED FILL — LANSOPRAZOLE DR 30 MG CAPSU: 30 | 90 days supply | Qty: 90 | Fill #1

## 2020-08-04 ENCOUNTER — Ambulatory Visit: Payer: 59 | Admitting: Internal Medicine

## 2020-08-25 MED FILL — DICLOFENAC SOD EC 75 MG TAB: 75 | 30 days supply | Qty: 60 | Fill #1

## 2020-08-29 ENCOUNTER — Ambulatory Visit: Payer: 59 | Admitting: Internal Medicine

## 2020-09-14 ENCOUNTER — Telehealth: Payer: Self-pay | Admitting: Internal Medicine

## 2020-09-14 ENCOUNTER — Emergency Department (HOSPITAL_COMMUNITY)
Admission: EM | Admit: 2020-09-14 | Discharge: 2020-09-14 | Disposition: A | Discharge status: Home / Self Care | Payer: 59 | Attending: Emergency Medicine | Admitting: Emergency Medicine

## 2020-09-14 ENCOUNTER — Emergency Department (HOSPITAL_COMMUNITY): Payer: 59

## 2020-09-14 ENCOUNTER — Other Ambulatory Visit (HOSPITAL_COMMUNITY): Payer: Self-pay | Admitting: Emergency Medicine

## 2020-09-14 ENCOUNTER — Encounter (HOSPITAL_COMMUNITY): Payer: Self-pay

## 2020-09-14 DIAGNOSIS — R109 Unspecified abdominal pain: Secondary | ICD-10-CM | POA: Diagnosis not present

## 2020-09-14 DIAGNOSIS — B349 Viral infection, unspecified: Secondary | ICD-10-CM

## 2020-09-14 DIAGNOSIS — U071 COVID-19: Secondary | ICD-10-CM | POA: Insufficient documentation

## 2020-09-14 DIAGNOSIS — I10 Essential (primary) hypertension: Secondary | ICD-10-CM | POA: Insufficient documentation

## 2020-09-14 DIAGNOSIS — R059 Cough, unspecified: Secondary | ICD-10-CM

## 2020-09-14 DIAGNOSIS — Z20822 Contact with and (suspected) exposure to covid-19: Secondary | ICD-10-CM

## 2020-09-14 DIAGNOSIS — R7303 Prediabetes: Secondary | ICD-10-CM | POA: Diagnosis not present

## 2020-09-14 DIAGNOSIS — Z79899 Other long term (current) drug therapy: Secondary | ICD-10-CM | POA: Diagnosis not present

## 2020-09-14 DIAGNOSIS — J45909 Unspecified asthma, uncomplicated: Secondary | ICD-10-CM | POA: Insufficient documentation

## 2020-09-14 DIAGNOSIS — Z90711 Acquired absence of uterus with remaining cervical stump: Secondary | ICD-10-CM | POA: Diagnosis not present

## 2020-09-14 DIAGNOSIS — Z90710 Acquired absence of both cervix and uterus: Secondary | ICD-10-CM | POA: Diagnosis not present

## 2020-09-14 DIAGNOSIS — Z9049 Acquired absence of other specified parts of digestive tract: Secondary | ICD-10-CM | POA: Diagnosis not present

## 2020-09-14 DIAGNOSIS — K219 Gastro-esophageal reflux disease without esophagitis: Secondary | ICD-10-CM | POA: Insufficient documentation

## 2020-09-14 DIAGNOSIS — R300 Dysuria: Secondary | ICD-10-CM | POA: Diagnosis not present

## 2020-09-14 LAB — URINALYSIS, ROUTINE W REFLEX MICROSCOPIC
Bilirubin Urine: NEGATIVE
Glucose, UA: NEGATIVE mg/dL
Hgb urine dipstick: NEGATIVE
Ketones, ur: 5 mg/dL — AB
Leukocytes,Ua: NEGATIVE
Nitrite: NEGATIVE
Protein, ur: NEGATIVE mg/dL
Specific Gravity, Urine: 1.026 (ref 1.005–1.030)
pH: 6 (ref 5.0–8.0)

## 2020-09-14 LAB — POC SARS CORONAVIRUS 2 AG -  ED: SARS Coronavirus 2 Ag: POSITIVE — AB

## 2020-09-14 MED ORDER — NAPROXEN 500 MG PO TABS: 500.0000 mg | ORAL_TABLET | Freq: Two times a day (BID) | ORAL | 0 refills | Status: DC | PRN

## 2020-09-14 MED ORDER — HYDROCOD POLST-CPM POLST ER 10-8 MG/5ML PO SUER
5.0000 mL | Freq: Once | ORAL | Status: AC
Start: 2020-09-14 — End: 2020-09-14
  Administered 2020-09-14: 5 mL via ORAL
  Filled 2020-09-14: qty 5

## 2020-09-14 MED ORDER — BENZONATATE 100 MG PO CAPS: 100.0000 mg | ORAL_CAPSULE | Freq: Three times a day (TID) | ORAL | 0 refills | Status: DC

## 2020-09-14 NOTE — Telephone Encounter (Signed)
[facility] Call/Report : ---Caller states: Symptoms started on tuesday and I got tested for COVID yesterday and waiting on results. The cough is dry and last night my left side is hurting and something felt like something pulled loose. The pain is 7/10 if I don't cough, but if I cough it is 12/10  Advised: GO TO ED NOW: * You need to be seen in the Emergency Department. REASSURANCE AND EDUCATION - GOING TO THE ED OR URGENT CARE CENTER DURING THE COVID-19 PANDEMIC: * If you or your child needs to be seen for an urgent medical problem, do not hesitate to go. ANOTHER ADULT SHOULD DRIVE: * It is better and safer if another adult drives instead of you. CARE ADVICE given per COVID-19 - DIAGNOSED OR SUSPECTED (Adult) guideline.

## 2020-09-14 NOTE — ED Notes (Signed)
Patient ambulatory to restroom  ?

## 2020-09-14 NOTE — Discharge Instructions (Addendum)
Your Covid test is positive. Please follow CDC guidelines regarding precautions for isolation. Follow-up with your primary doctor closely. Take cough suppressant as needed.  Take Tylenol or naproxen for pain control.  If you develop generalized abdominal pain, vomiting, any difficulty in breathing or chest pain, return to ER for reassessment.

## 2020-09-14 NOTE — ED Triage Notes (Signed)
Pt states that she has had L sided flank pain states it hurts more to move, cough, and possibly hurts when she urinates. Alert and oriented.

## 2020-09-14 NOTE — ED Provider Notes (Signed)
[facility] DEPT Provider Note   CSN: [id number] Arrival date & time: 09/14/20  1047     History Chief Complaint  Patient presents with  . Flank Pain    Dawn Townsend is a 54 y.o. female.  Presents to ER with concern for cough and flank pain.  Patient reports that she has had a cough for the past few days.  Dry, nonproductive.  No associated difficulty in breathing, no associated fevers.  Yesterday started having some pain on her left side, states pain only occurs when she is coughing.  Minimal pain at present.  No generalized abdominal pain, no nausea or vomiting, no dysuria, hematuria.   HPI     Past Medical History:  Diagnosis Date  . Arthritis    knee   . Asthma    no inhaler  . Fibroids   . GERD (gastroesophageal reflux disease)   . Hyperlipidemia   . Hypertension   . SVD (spontaneous vaginal delivery)    x 1  . Tendonitis of foot     Patient Active Problem List   Diagnosis Date Noted  . Hair loss 09/18/2018  . Bumps on skin 09/18/2018  . Seborrheic dermatitis of scalp 06/10/2018  . Cardiac murmur 02/27/2018  . Ankle edema, bilateral 02/27/2018  . 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 Prediabetes 10/15/2016  . Asthma 06/27/2016  . GERD (gastroesophageal reflux disease) 06/27/2016  . Uncontrolled hypertension 06/27/2016  . Dysuria 06/27/2016  . Morbid obesity ([place]) 06/27/2016  . Family history of diabetes mellitus (DM) 06/27/2016  . Eczema 06/27/2016  . Chronic lower back pain 06/27/2016  . Bilateral knee pain 06/27/2016    Past Surgical History:  Procedure Laterality Date  . ABDOMINAL HYSTERECTOMY Bilateral 06/08/2014   Procedure: HYSTERECTOMY ABDOMINAL/BILATERAL SALPINGECTOMY/POSSIBLE BILATERAL SALPINGO OOPHORECTOMY;  Surgeon: Daria Pastures, MD;  Location: [facility];  Service: Gynecology;  Laterality: Bilateral;  POSSIBLE STAGING  . BILATERAL SALPINGECTOMY Right 06/08/2014   Procedure: BILATERAL SALPINGECTOMY;  Surgeon:  Daria Pastures, MD;  Location: [facility];  Service: Gynecology;  Laterality: Right;  . CHOLECYSTECTOMY  2000  . OOPHORECTOMY Left 06/08/2014   Procedure: OOPHORECTOMY;  Surgeon: Daria Pastures, MD;  Location: [facility];  Service: Gynecology;  Laterality: Left;  . WISDOM TOOTH EXTRACTION       OB History   No obstetric history on file.     Family History  Problem Relation Age of Onset  . Arthritis Mother   . Heart disease Mother   . Hypertension Mother   . Diabetes Mother   . Arthritis Father   . Heart disease Father   . Hypertension Father   . Diabetes Father   . Arthritis Maternal Grandmother   . Arthritis Maternal Grandfather   . Arthritis Paternal Grandmother   . Arthritis Paternal Grandfather     Social History   Tobacco Use  . Smoking status: Never Smoker  . Smokeless tobacco: Never Used  Substance Use Topics  . Alcohol use: No  . Drug use: No    Home Medications Prior to Admission medications   Medication Sig Start Date End Date Taking? Authorizing Provider  benzonatate (TESSALON) 100 MG capsule Take 1 capsule (100 mg total) by mouth every 8 (eight) hours. 09/14/20  Yes Lucrezia Starch, MD  naproxen (NAPROSYN) 500 MG tablet Take 1 tablet (500 mg total) by mouth 2 (two) times daily as needed for mild pain or moderate pain. 09/14/20  Yes Lucrezia Starch, MD  Albuterol Sulfate (PROAIR  RESPICLICK) 123XX123 (90 Base) MCG/ACT AEPB Inhale 2 puffs into the lungs every 4 (four) hours as needed (wheeze). 02/01/20   Binnie Rail, MD  Cholecalciferol (VITAMIN D PO) Take by mouth.    [provider]  diclofenac (VOLTAREN) 75 MG EC tablet  09/04/16   [provider]  hydrochlorothiazide (HYDRODIURIL) 25 MG tablet Take 1 tablet (25 mg total) by mouth daily. 05/19/20   Binnie Rail, MD  lansoprazole (PREVACID) 30 MG capsule TAKE 1 CAPSULE (30 MG TOTAL) BY MOUTH DAILY BEFORE BREAKFAST. 04/12/20   Burns, Claudina Lick, MD  montelukast (SINGULAIR) 10 MG tablet Take 1  tablet (10 mg total) by mouth at bedtime. 02/01/20   Binnie Rail, MD  nebivolol (BYSTOLIC) 5 MG tablet Take 1 tablet (5 mg total) by mouth daily. 02/01/20   Binnie Rail, MD  triamcinolone cream (KENALOG) 0.5 % Apply 1 application topically 3 (three) times daily. 06/10/18   Binnie Rail, MD    Allergies    Patient has no known allergies.  Review of Systems   Review of Systems  Constitutional: Negative for chills and fever.  HENT: Negative for ear pain and sore throat.   Eyes: Negative for pain and visual disturbance.  Respiratory: Positive for cough. Negative for shortness of breath.   Cardiovascular: Negative for chest pain and palpitations.  Gastrointestinal: Negative for abdominal pain and vomiting.  Genitourinary: Positive for flank pain. Negative for dysuria and hematuria.  Musculoskeletal: Positive for back pain. Negative for arthralgias.  Skin: Negative for color change and rash.  Neurological: Negative for seizures and syncope.  All other systems reviewed and are negative.   Physical Exam Updated Vital Signs BP (!) 179/105 (BP Location: Left Arm)   Pulse 97   Temp 99.5 F (37.5 C) (Oral)   Resp 17   Ht 5\' 10"  (1.778 m)   Wt 129.3 kg   LMP 06/02/2014   SpO2 97%   BMI 40.89 kg/m   Physical Exam Vitals and nursing note reviewed.  Constitutional:      General: She is not in acute distress.    Appearance: She is well-developed and well-nourished.  HENT:     Head: Normocephalic and atraumatic.  Eyes:     Conjunctiva/sclera: Conjunctivae normal.  Cardiovascular:     Rate and Rhythm: Normal rate and regular rhythm.     Heart sounds: No murmur heard.   Pulmonary:     Effort: Pulmonary effort is normal. No respiratory distress.     Breath sounds: Normal breath sounds.  Abdominal:     Palpations: Abdomen is soft.     Tenderness: There is no abdominal tenderness.     Comments: Tenderness to palpation over left flank, left lower chest wall  Musculoskeletal:         General: No edema.     Cervical back: Neck supple.  Skin:    General: Skin is warm and dry.  Neurological:     General: No focal deficit present.     Mental Status: She is alert.  Psychiatric:        Mood and Affect: Mood and affect and mood normal.     ED Results / Procedures / Treatments   Labs (all labs ordered are listed, but only abnormal results are displayed) Labs Reviewed  URINALYSIS, ROUTINE W REFLEX MICROSCOPIC - Abnormal; Notable for the following components:      Result Value   APPearance HAZY (*)    Ketones, ur 5 (*)  All other components within normal limits  POC SARS CORONAVIRUS 2 AG -  ED - Abnormal; Notable for the following components:   SARS Coronavirus 2 Ag POSITIVE (*)    All other components within normal limits    EKG None  Radiology CT Renal Stone Study  Result Date: 09/14/2020 CLINICAL DATA:  Left-sided flank pain, dysuria EXAM: CT ABDOMEN AND PELVIS WITHOUT CONTRAST TECHNIQUE: Multidetector CT imaging of the abdomen and pelvis was performed following the standard protocol without IV contrast. COMPARISON:  04/08/2014 FINDINGS: Lower chest: No acute pleural or parenchymal lung disease. Hepatobiliary: No focal liver abnormality is seen. Status post cholecystectomy. No biliary dilatation. Pancreas: Unremarkable. No pancreatic ductal dilatation or surrounding inflammatory changes. Spleen: Normal in size without focal abnormality. Adrenals/Urinary Tract: No urinary tract calculi or obstructive uropathy. Bladder is decompressed, limiting its evaluation. The adrenals are normal. Stomach/Bowel: No bowel obstruction or ileus. No bowel wall thickening or inflammatory change. Vascular/Lymphatic: No significant vascular findings. Subcentimeter lymph nodes in the bilateral iliac distribution unchanged. No pathologic adenopathy. Reproductive: Status post hysterectomy. No adnexal masses. Other: No free fluid or free gas. No abdominal wall hernia. Musculoskeletal: No  acute or destructive bony lesions. Reconstructed images demonstrate no additional findings. IMPRESSION: 1. No acute intra-abdominal or intrapelvic process. No urinary tract calculi or obstructive uropathy. Electronically Signed   By: Sharlet Salina M.D.   On: 09/14/2020 20:50    Procedures Procedures (including critical care time)  Medications Ordered in ED Medications  chlorpheniramine-HYDROcodone (TUSSIONEX) 10-8 MG/5ML suspension 5 mL (5 mLs Oral Given 09/14/20 2150)    ED Course  I have reviewed the triage vital signs and the nursing notes.  Pertinent labs & imaging results that were available during my care of the patient were reviewed by me and considered in my medical decision making (see chart for details).    MDM Rules/Calculators/A&P                          54 year old lady presenting to ER with concern for cough and left side pain.  Patient well-appearing, vital stable.  Low-grade temperature.  Noted mild tenderness in her left flank.  Abdomen soft.  CT scan negative.  Her Covid test is positive.  Suspect this is the cause of her cough which is causing MSK pain on her side.  Her lungs are clear.  Oxygen normal.  She is appropriate for outpatient management at this time.  Recommend recheck with primary doctor.  Reviewed isolation precautions and discharged home.    After the discussed management above, the patient was determined to be safe for discharge.  The patient was in agreement with this plan and all questions regarding their care were answered.  ED return precautions were discussed and the patient will return to the ED with any significant worsening of condition.  Dawn Townsend was evaluated in Emergency Department on 09/14/2020 for the symptoms described in the history of present illness. She was evaluated in the context of the global COVID-19 pandemic, which necessitated consideration that the patient might be at risk for infection with the SARS-CoV-2 virus that causes  COVID-19. Institutional protocols and algorithms that pertain to the evaluation of patients at risk for COVID-19 are in a state of rapid change based on information released by regulatory bodies including the CDC and federal and state organizations. These policies and algorithms were followed during the patient's care in the ED.   Final Clinical Impression(s) / ED Diagnoses Final  diagnoses:  Left flank pain  Cough  Viral syndrome  Suspected COVID-19 virus infection  COVID-19    Rx / DC Orders ED Discharge Orders         Ordered    benzonatate (TESSALON) 100 MG capsule  Every 8 hours        09/14/20 2153    naproxen (NAPROSYN) 500 MG tablet  2 times daily PRN        09/14/20 2153           Lucrezia Starch, MD 09/14/20 2249

## 2020-09-14 NOTE — Telephone Encounter (Signed)
Patient called and said she has a really bad cold but when she coughs her left side hurts. Transferred to [facility].

## 2020-09-15 MED FILL — NAPROXEN 500 MG TABLET: 500 | 15 days supply | Qty: 30 | Fill #0

## 2020-09-15 MED FILL — BENZONATATE 100 MG CAPS: 100 | 7 days supply | Qty: 21 | Fill #0

## 2020-09-20 ENCOUNTER — Other Ambulatory Visit: Payer: Self-pay | Admitting: Internal Medicine

## 2020-09-20 ENCOUNTER — Telehealth: Payer: Self-pay | Admitting: Internal Medicine

## 2020-09-20 MED ORDER — HYDROCOD POLST-CPM POLST ER 10-8 MG/5ML PO SUER: 5.0000 mL | Freq: Two times a day (BID) | ORAL | 0 refills | Status: DC | PRN

## 2020-09-20 MED FILL — HYDROCODONE-CHLORPHEN ER SU: 10-8 | 10 days supply | Qty: 100 | Fill #0

## 2020-09-20 MED FILL — DICLOFENAC SOD EC 75 MG TAB: 75 | 30 days supply | Qty: 60 | Fill #2

## 2020-09-20 MED FILL — HYDROCHLOROTHIAZIDE 25 MG T: 25 | 90 days supply | Qty: 90 | Fill #1

## 2020-09-20 MED FILL — NEBIVOLOL HCL 5 MG TABS: 5 | 30 days supply | Qty: 30 | Fill #2

## 2020-09-20 MED FILL — MONTELUKAST SOD 10 MG TAB: 10 | 90 days supply | Qty: 90 | Fill #0

## 2020-09-20 NOTE — Telephone Encounter (Signed)
   Patient requesting RX for cough medication She is COVID positive and struggling at night with cough  Please advise

## 2020-09-20 NOTE — Telephone Encounter (Signed)
Cough syrup sent to pharmacy.  

## 2020-09-21 NOTE — Telephone Encounter (Signed)
Message left for patient

## 2020-09-22 ENCOUNTER — Other Ambulatory Visit: Payer: Self-pay | Admitting: Internal Medicine

## 2020-09-22 ENCOUNTER — Encounter: Payer: Self-pay | Admitting: Internal Medicine

## 2020-09-22 ENCOUNTER — Other Ambulatory Visit: Payer: Self-pay

## 2020-09-22 ENCOUNTER — Telehealth (INDEPENDENT_AMBULATORY_CARE_PROVIDER_SITE_OTHER): Payer: 59 | Admitting: Internal Medicine

## 2020-09-22 DIAGNOSIS — U071 COVID-19: Secondary | ICD-10-CM | POA: Diagnosis not present

## 2020-09-22 MED ORDER — PREDNISONE 20 MG PO TABS: 20.0000 mg | ORAL_TABLET | Freq: Every day | ORAL | 0 refills | Status: DC

## 2020-09-22 MED ORDER — ALBUTEROL SULFATE HFA 108 (90 BASE) MCG/ACT IN AERS: 1.0000 | INHALATION_SPRAY | Freq: Four times a day (QID) | RESPIRATORY_TRACT | 5 refills | Status: DC | PRN

## 2020-09-22 MED ORDER — ONDANSETRON 4 MG PO TBDP: 4.0000 mg | ORAL_TABLET | Freq: Three times a day (TID) | ORAL | 0 refills | Status: DC | PRN

## 2020-09-22 MED FILL — predniSONE 20 MG TABS: 20 | 5 days supply | Qty: 5 | Fill #0

## 2020-09-22 MED FILL — ONDANSETRON ODT 4 MG TABLET: 4 | 7 days supply | Qty: 20 | Fill #0

## 2020-09-22 MED FILL — ALBUTEROL SULFATE HFA 108 (: 108 (90 BAS | 25 days supply | Qty: 18 | Fill #0

## 2020-09-22 NOTE — Assessment & Plan Note (Signed)
Acute Symptoms started 12/29-tested +12/30 Symptoms relatively mild, but persistent Mostly dry cough with some sputum, mild shortness of breath with exertion, headache, nausea or the most concerning symptoms.  No fever Continue Tussionex cough syrup twice daily as needed, Tessalon Perles as needed when she does not take the cough syrup For nausea we will start Zofran 4 mg every 8 hours as needed Albuterol inhaler every 6 hours as needed for wheezing, tightness, shortness of breath and cough Prednisone 20 mg daily x5 days She will have paperwork sent from work-we will defer out of work through 1/11.  She will return on 1/12 unless anything changes.  She will call if her symptoms do not improve or with any concerns

## 2020-09-22 NOTE — Progress Notes (Signed)
Virtual Visit via Video Note  I connected with Dawn Townsend on 09/22/20 at  1:00 PM EST by a video enabled telemedicine application and verified that I am speaking with the correct person using two identifiers.   I discussed the limitations of evaluation and management by telemedicine and the availability of in person appointments. The patient expressed understanding and agreed to proceed.  Present for the visit:  Myself, Dr Billey Gosling, Rockie Neighbours.  The patient is currently at home and I am in the office.    No referring provider.    History of Present Illness: This is an acute visit for covid.  Last wednesday she started not feeling well.  She got her covid test Wed pm.  She started having side pain on Thursday - got an CXR in ED - had positive covid test.    Cough has become more productive.  Hot/cold, chills.  Body aches.  Some SOB with activity.  Has HA, cough, nausea.  She has had a little bit of wheezing, but feels that has resolved.  She feels like the cough is what is most bothersome in addition to the nausea and headaches.  She is post return to work tomorrow and does not feel that she can go back to work like this.  Taking Tessalon perles, tussionex cough syrup  Review of Systems  Constitutional: Positive for chills and malaise/fatigue. Negative for fever.  HENT: Negative for congestion, sinus pain and sore throat.   Respiratory: Positive for cough, sputum production, shortness of breath (with exertion) and wheezing (mild - resolved).   Gastrointestinal: Positive for nausea. Negative for abdominal pain, diarrhea and heartburn.  Neurological: Positive for headaches.      Social History   Socioeconomic History  . Marital status: Single    Spouse name: Not on file  . Number of children: Not on file  . Years of education: Not on file  . Highest education level: Not on file  Occupational History  . Not on file  Tobacco Use  . Smoking status: Never Smoker  . Smokeless  tobacco: Never Used  Substance and Sexual Activity  . Alcohol use: No  . Drug use: No  . Sexual activity: Not Currently    Birth control/protection: None  Other Topics Concern  . Not on file  Social History Narrative  . Not on file   Social Determinants of Health   Financial Resource Strain: Not on file  Food Insecurity: Not on file  Transportation Needs: Not on file  Physical Activity: Not on file  Stress: Not on file  Social Connections: Not on file     Observations/Objective: Appears well in NAD Frequent intermittent dry cough, breathing normally Skin appears warm and dry  Assessment and Plan:  See Problem List for Assessment and Plan of chronic medical problems.   Follow Up Instructions:    I discussed the assessment and treatment plan with the patient. The patient was provided an opportunity to ask questions and all were answered. The patient agreed with the plan and demonstrated an understanding of the instructions.   The patient was advised to call back or seek an in-person evaluation if the symptoms worsen or if the condition fails to improve as anticipated.    Binnie Rail, MD

## 2020-09-25 ENCOUNTER — Telehealth: Payer: Self-pay | Admitting: Internal Medicine

## 2020-09-25 DIAGNOSIS — Z0279 Encounter for issue of other medical certificate: Secondary | ICD-10-CM

## 2020-09-25 NOTE — Telephone Encounter (Signed)
Forms have been signed, Faxed to [employer] [phone], Copy sent to scan, &Charged for.

## 2020-09-25 NOTE — Telephone Encounter (Signed)
I received FMLA via fax from [employer].  LOV: 09/22/2020 +COVID on 09/14/20 Start leave: 12/30 to 09/26/20  Forms have been completed &Placed in providers box to review and sign.

## 2020-09-25 NOTE — Telephone Encounter (Signed)
Patient informed and Original mailed to patient for her records as requested.

## 2020-09-26 ENCOUNTER — Ambulatory Visit: Payer: 59 | Admitting: Internal Medicine

## 2020-11-02 MED FILL — NEBIVOLOL HCL 5 MG TABS: 5 | 30 days supply | Qty: 30 | Fill #3

## 2020-11-02 MED FILL — LANSOPRAZOLE DR 30 MG CAPSU: 30 | 90 days supply | Qty: 90 | Fill #0

## 2020-11-02 MED FILL — DICLOFENAC SOD EC 75 MG TAB: 75 | 30 days supply | Qty: 60 | Fill #3

## 2020-12-03 NOTE — Patient Instructions (Addendum)
Blood work was ordered.     Medications changes include :   Increase bystolic to 10 mg daily  Your prescription(s) have been submitted to your pharmacy. Please take as directed and contact our office if you believe you are having problem(s) with the medication(s).   A referral was ordered for nutritionist and sports medicine.       Someone from their office will call you to schedule an appointment.     Please followup in 6 months     Hypertension, Adult High blood pressure (hypertension) is when the force of blood pumping through the arteries is too strong. The arteries are the blood vessels that carry blood from the heart throughout the body. Hypertension forces the heart to work harder to pump blood and may cause arteries to become narrow or stiff. Untreated or uncontrolled hypertension can cause a heart attack, heart failure, a stroke, kidney disease, and other problems. A blood pressure reading consists of a higher number over a lower number. Ideally, your blood pressure should be below 120/80. The first ("top") number is called the systolic pressure. It is a measure of the pressure in your arteries as your heart beats. The second ("bottom") number is called the diastolic pressure. It is a measure of the pressure in your arteries as the heart relaxes. What are the causes? The exact cause of this condition is not known. There are some conditions that result in or are related to high blood pressure. What increases the risk? Some risk factors for high blood pressure are under your control. The following factors may make you more likely to develop this condition:  Smoking.  Having type 2 diabetes mellitus, high cholesterol, or both.  Not getting enough exercise or physical activity.  Being overweight.  Having too much fat, sugar, calories, or salt (sodium) in your diet.  Drinking too much alcohol. Some risk factors for high blood pressure may be difficult or impossible to  change. Some of these factors include:  Having chronic kidney disease.  Having a family history of high blood pressure.  Age. Risk increases with age.  Race. You may be at higher risk if you are African American.  Gender. Men are at higher risk than women before age 74. After age 21, women are at higher risk than men.  Having obstructive sleep apnea.  Stress. What are the signs or symptoms? High blood pressure may not cause symptoms. Very high blood pressure (hypertensive crisis) may cause:  Headache.  Anxiety.  Shortness of breath.  Nosebleed.  Nausea and vomiting.  Vision changes.  Severe chest pain.  Seizures. How is this diagnosed? This condition is diagnosed by measuring your blood pressure while you are seated, with your arm resting on a flat surface, your legs uncrossed, and your feet flat on the floor. The cuff of the blood pressure monitor will be placed directly against the skin of your upper arm at the level of your heart. It should be measured at least twice using the same arm. Certain conditions can cause a difference in blood pressure between your right and left arms. Certain factors can cause blood pressure readings to be lower or higher than normal for a short period of time:  When your blood pressure is higher when you are in a health care provider's office than when you are at home, this is called white coat hypertension. Most people with this condition do not need medicines.  When your blood pressure is higher at home than when  you are in a health care provider's office, this is called masked hypertension. Most people with this condition may need medicines to control blood pressure. If you have a high blood pressure reading during one visit or you have normal blood pressure with other risk factors, you may be asked to:  Return on a different day to have your blood pressure checked again.  Monitor your blood pressure at home for 1 week or longer. If you  are diagnosed with hypertension, you may have other blood or imaging tests to help your health care provider understand your overall risk for other conditions. How is this treated? This condition is treated by making healthy lifestyle changes, such as eating healthy foods, exercising more, and reducing your alcohol intake. Your health care provider may prescribe medicine if lifestyle changes are not enough to get your blood pressure under control, and if:  Your systolic blood pressure is above 130.  Your diastolic blood pressure is above 80. Your personal target blood pressure may vary depending on your medical conditions, your age, and other factors. Follow these instructions at home: Eating and drinking  Eat a diet that is high in fiber and potassium, and low in sodium, added sugar, and fat. An example eating plan is called the DASH (Dietary Approaches to Stop Hypertension) diet. To eat this way: ? Eat plenty of fresh fruits and vegetables. Try to fill one half of your plate at each meal with fruits and vegetables. ? Eat whole grains, such as whole-wheat pasta, brown rice, or whole-grain bread. Fill about one fourth of your plate with whole grains. ? Eat or drink low-fat dairy products, such as skim milk or low-fat yogurt. ? Avoid fatty cuts of meat, processed or cured meats, and poultry with skin. Fill about one fourth of your plate with lean proteins, such as fish, chicken without skin, beans, eggs, or tofu. ? Avoid pre-made and processed foods. These tend to be higher in sodium, added sugar, and fat.  Reduce your daily sodium intake. Most people with hypertension should eat less than 1,500 mg of sodium a day.  Do not drink alcohol if: ? Your health care provider tells you not to drink. ? You are pregnant, may be pregnant, or are planning to become pregnant.  If you drink alcohol: ? Limit how much you use to:  0-1 drink a day for women.  0-2 drinks a day for men. ? Be aware of how  much alcohol is in your drink. In the [place] one drink equals one 12 oz bottle of beer (355 mL), one 5 oz glass of wine (148 mL), or one 1 oz glass of hard liquor (44 mL).   Lifestyle  Work with your health care provider to maintain a healthy body weight or to lose weight. Ask what an ideal weight is for you.  Get at least 30 minutes of exercise most days of the week. Activities may include walking, swimming, or biking.  Include exercise to strengthen your muscles (resistance exercise), such as Pilates or lifting weights, as part of your weekly exercise routine. Try to do these types of exercises for 30 minutes at least 3 days a week.  Do not use any products that contain nicotine or tobacco, such as cigarettes, e-cigarettes, and chewing tobacco. If you need help quitting, ask your health care provider.  Monitor your blood pressure at home as told by your health care provider.  Keep all follow-up visits as told by your health care provider. This  is important.   Medicines  Take over-the-counter and prescription medicines only as told by your health care provider. Follow directions carefully. Blood pressure medicines must be taken as prescribed.  Do not skip doses of blood pressure medicine. Doing this puts you at risk for problems and can make the medicine less effective.  Ask your health care provider about side effects or reactions to medicines that you should watch for. Contact a health care provider if you:  Think you are having a reaction to a medicine you are taking.  Have headaches that keep coming back (recurring).  Feel dizzy.  Have swelling in your ankles.  Have trouble with your vision. Get help right away if you:  Develop a severe headache or confusion.  Have unusual weakness or numbness.  Feel faint.  Have severe pain in your chest or abdomen.  Vomit repeatedly.  Have trouble breathing. Summary  Hypertension is when the force of blood pumping through your  arteries is too strong. If this condition is not controlled, it may put you at risk for serious complications.  Your personal target blood pressure may vary depending on your medical conditions, your age, and other factors. For most people, a normal blood pressure is less than 120/80.  Hypertension is treated with lifestyle changes, medicines, or a combination of both. Lifestyle changes include losing weight, eating a healthy, low-sodium diet, exercising more, and limiting alcohol. This information is not intended to replace advice given to you by your health care provider. Make sure you discuss any questions you have with your health care provider. Document Revised: 05/13/2018 Document Reviewed: 05/13/2018 Elsevier Patient Education  2021 [employer].

## 2020-12-03 NOTE — Progress Notes (Addendum)
Subjective:    Patient ID: Dawn Townsend, female    DOB: 1966-09-06, 55 y.o.   MRN: [id number]  HPI The patient is here for follow up of their chronic medical problems, including htn, prediabetes, gerd, asthma  She did not realize she weighed that much.  She did stop the soda.  She is drinking a lot of water and may need to increase that.  She has been eating too much fast food.  Part of the reason is because of her back pain and she does not want to stand and fix food when she gets home from work.  She knows she needs to stop doing that.   She has had some back pain.  It is in her central lower back.  It hurts when she is standing or dong a lot fo moving.  Leaning forward or sitting down helps.  She has been told in the past that she has arthritis.  She has an MRI in the past.   She sometimes forgets her hctz because she has to take it in the afternoon.  She can not take it in the morning due to work.  Her legs swell up.     Some depression and anxiety since her mom died.  She does not feel she needs medication.    Medications and allergies reviewed with patient and updated if appropriate.  Patient Active Problem List   Diagnosis Date Noted  . COVID 09/22/2020  . Hair loss 09/18/2018  . Bumps on skin 09/18/2018  . Seborrheic dermatitis of scalp 06/10/2018  . Cardiac murmur 02/27/2018  . Ankle edema, bilateral 02/27/2018  . 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 Prediabetes 10/15/2016  . Asthma 06/27/2016  . GERD (gastroesophageal reflux disease) 06/27/2016  . Uncontrolled hypertension 06/27/2016  . Morbid obesity ([place]) 06/27/2016  . Family history of diabetes mellitus (DM) 06/27/2016  . Eczema 06/27/2016  . Chronic lower back pain 06/27/2016  . Bilateral knee pain 06/27/2016    Current Outpatient Medications on File Prior to Visit  Medication Sig Dispense Refill  . albuterol (VENTOLIN HFA) 108 (90 Base) MCG/ACT inhaler Inhale 1-2 puffs into the lungs every 6 (six) hours as  needed. 1 each 5  . Cholecalciferol (VITAMIN D PO) Take by mouth.    . diclofenac (VOLTAREN) 75 MG EC tablet   0  . hydrochlorothiazide (HYDRODIURIL) 25 MG tablet Take 1 tablet (25 mg total) by mouth daily. 90 tablet 3  . lansoprazole (PREVACID) 30 MG capsule TAKE 1 CAPSULE (30 MG TOTAL) BY MOUTH DAILY BEFORE BREAKFAST. 90 capsule 1  . montelukast (SINGULAIR) 10 MG tablet TAKE 1 TABLET (10 MG TOTAL) BY MOUTH AT BEDTIME. 90 tablet 1  . nebivolol (BYSTOLIC) 5 MG tablet Take 1 tablet (5 mg total) by mouth daily. 30 tablet 5   No current facility-administered medications on file prior to visit.    Past Medical History:  Diagnosis Date  . Arthritis    knee   . Asthma    no inhaler  . Fibroids   . GERD (gastroesophageal reflux disease)   . Hyperlipidemia   . Hypertension   . SVD (spontaneous vaginal delivery)    x 1  . Tendonitis of foot     Past Surgical History:  Procedure Laterality Date  . ABDOMINAL HYSTERECTOMY Bilateral 06/08/2014   Procedure: HYSTERECTOMY ABDOMINAL/BILATERAL SALPINGECTOMY/POSSIBLE BILATERAL SALPINGO OOPHORECTOMY;  Surgeon: Daria Pastures, MD;  Location: [facility];  Service: Gynecology;  Laterality: Bilateral;  POSSIBLE STAGING  . BILATERAL  SALPINGECTOMY Right 06/08/2014   Procedure: BILATERAL SALPINGECTOMY;  Surgeon: Daria Pastures, MD;  Location: [facility];  Service: Gynecology;  Laterality: Right;  . CHOLECYSTECTOMY  2000  . OOPHORECTOMY Left 06/08/2014   Procedure: OOPHORECTOMY;  Surgeon: Daria Pastures, MD;  Location: [facility];  Service: Gynecology;  Laterality: Left;  . WISDOM TOOTH EXTRACTION      Social History   Socioeconomic History  . Marital status: Single    Spouse name: Not on file  . Number of children: Not on file  . Years of education: Not on file  . Highest education level: Not on file  Occupational History  . Not on file  Tobacco Use  . Smoking status: Never Smoker  . Smokeless tobacco: Never Used  Substance and Sexual Activity   . Alcohol use: No  . Drug use: No  . Sexual activity: Not Currently    Birth control/protection: None  Other Topics Concern  . Not on file  Social History Narrative  . Not on file   Social Determinants of Health   Financial Resource Strain: Not on file  Food Insecurity: Not on file  Transportation Needs: Not on file  Physical Activity: Not on file  Stress: Not on file  Social Connections: Not on file    Family History  Problem Relation Age of Onset  . Arthritis Mother   . Heart disease Mother   . Hypertension Mother   . Diabetes Mother   . Arthritis Father   . Heart disease Father   . Hypertension Father   . Diabetes Father   . Arthritis Maternal Grandmother   . Arthritis Maternal Grandfather   . Arthritis Paternal Grandmother   . Arthritis Paternal Grandfather     Review of Systems  Constitutional: Negative for chills and fever.  Respiratory: Positive for shortness of breath (occ - more than usual - not w/o mask). Negative for cough and wheezing.   Cardiovascular: Positive for leg swelling. Negative for chest pain and palpitations.  Neurological: Positive for headaches. Negative for light-headedness.       Objective:   Vitals:   12/04/20 1046  BP: (!) 160/90  Pulse: 68  Temp: 98.1 F (36.7 C)  SpO2: 99%   BP Readings from Last 3 Encounters:  12/04/20 (!) 160/90  09/14/20 (!) 179/105  02/01/20 (!) 164/100   Wt Readings from Last 3 Encounters:  12/04/20 294 lb (133.4 kg)  09/14/20 285 lb (129.3 kg)  02/01/20 291 lb (132 kg)   Body mass index is 42.18 kg/m.  Depression screen [facility] 2/9 12/04/2020 06/22/2019 11/12/2017  Decreased Interest 2 0 0  Down, Depressed, Hopeless 2 0 1  PHQ - 2 Score 4 0 1  Altered sleeping 2 - -  Tired, decreased energy 2 - -  Change in appetite 2 - -  Feeling bad or failure about yourself  1 - -  Trouble concentrating 0 - -  Moving slowly or fidgety/restless 0 - -  Suicidal thoughts 0 - -  PHQ-9 Score 11 - -  Difficult  doing work/chores Not difficult at all - -    GAD 7 : Generalized Anxiety Score 12/04/2020  Nervous, Anxious, on Edge 2  Control/stop worrying 2  Worry too much - different things 2  Trouble relaxing 0  Restless 0  Easily annoyed or irritable 0  Afraid - awful might happen 0  Total GAD 7 Score 6  Anxiety Difficulty Not difficult at all       Physical  Exam    Constitutional: Appears well-developed and well-nourished. No distress.  HENT:  Head: Normocephalic and atraumatic.  Neck: Neck supple. No tracheal deviation present. No thyromegaly present.  No cervical lymphadenopathy Cardiovascular: Normal rate, regular rhythm and normal heart sounds.   No murmur heard. No carotid bruit .  1+ left lower extremity edema, mild right lower extremity edema Pulmonary/Chest: Effort normal and breath sounds normal. No respiratory distress. No has no wheezes. No rales. Musculoskeletal: No tenderness with palpation across lower back or lumbar spine.  Normal sensation and strength in lower extremities Skin: Skin is warm and dry. Not diaphoretic.  Psychiatric: Normal mood and affect. Behavior is normal.      Assessment & Plan:    See Problem List for Assessment and Plan of chronic medical problems.    This visit occurred during the SARS-CoV-2 public health emergency.  Safety protocols were in place, including screening questions prior to the visit, additional usage of staff PPE, and extensive cleaning of exam room while observing appropriate contact time as indicated for disinfecting solutions.

## 2020-12-04 ENCOUNTER — Ambulatory Visit: Payer: 59 | Admitting: Internal Medicine

## 2020-12-04 ENCOUNTER — Other Ambulatory Visit: Payer: Self-pay | Admitting: Internal Medicine

## 2020-12-04 ENCOUNTER — Encounter: Payer: Self-pay | Admitting: Internal Medicine

## 2020-12-04 ENCOUNTER — Other Ambulatory Visit: Payer: Self-pay

## 2020-12-04 VITALS — BP 160/90 | HR 68 | Temp 98.1°F | Ht 70.0 in | Wt 294.0 lb

## 2020-12-04 DIAGNOSIS — J45909 Unspecified asthma, uncomplicated: Secondary | ICD-10-CM

## 2020-12-04 DIAGNOSIS — K219 Gastro-esophageal reflux disease without esophagitis: Secondary | ICD-10-CM | POA: Diagnosis not present

## 2020-12-04 DIAGNOSIS — R7303 Prediabetes: Secondary | ICD-10-CM | POA: Diagnosis not present

## 2020-12-04 DIAGNOSIS — F4323 Adjustment disorder with mixed anxiety and depressed mood: Secondary | ICD-10-CM | POA: Diagnosis not present

## 2020-12-04 DIAGNOSIS — M25561 Pain in right knee: Secondary | ICD-10-CM

## 2020-12-04 DIAGNOSIS — M545 Low back pain, unspecified: Secondary | ICD-10-CM

## 2020-12-04 DIAGNOSIS — I10 Essential (primary) hypertension: Secondary | ICD-10-CM | POA: Diagnosis not present

## 2020-12-04 DIAGNOSIS — M25562 Pain in left knee: Secondary | ICD-10-CM

## 2020-12-04 DIAGNOSIS — G8929 Other chronic pain: Secondary | ICD-10-CM

## 2020-12-04 LAB — CBC WITH DIFFERENTIAL/PLATELET
Basophils Absolute: 0 10*3/uL (ref 0.0–0.1)
Basophils Relative: 0.2 % (ref 0.0–3.0)
Eosinophils Absolute: 0.2 10*3/uL (ref 0.0–0.7)
Eosinophils Relative: 3.8 % (ref 0.0–5.0)
HCT: 39.4 % (ref 36.0–46.0)
Hemoglobin: 12.9 g/dL (ref 12.0–15.0)
Lymphocytes Relative: 29.1 % (ref 12.0–46.0)
Lymphs Abs: 1.4 10*3/uL (ref 0.7–4.0)
MCHC: 32.8 g/dL (ref 30.0–36.0)
MCV: 81.2 fl (ref 78.0–100.0)
Monocytes Absolute: 0.8 10*3/uL (ref 0.1–1.0)
Monocytes Relative: 15.4 % — ABNORMAL HIGH (ref 3.0–12.0)
Neutro Abs: 2.5 10*3/uL (ref 1.4–7.7)
Neutrophils Relative %: 51.5 % (ref 43.0–77.0)
Platelets: 212 10*3/uL (ref 150.0–400.0)
RBC: 4.85 Mil/uL (ref 3.87–5.11)
RDW: 15.6 % — ABNORMAL HIGH (ref 11.5–15.5)
WBC: 4.9 10*3/uL (ref 4.0–10.5)

## 2020-12-04 LAB — LIPID PANEL
Cholesterol: 199 mg/dL (ref 0–200)
HDL: 61.5 mg/dL (ref >39.00)
LDL Cholesterol: 113 mg/dL — ABNORMAL HIGH (ref 0–99)
NonHDL: 137.65
Total CHOL/HDL Ratio: 3
Triglycerides: 125 mg/dL (ref 0.0–149.0)
VLDL: 25 mg/dL (ref 0.0–40.0)

## 2020-12-04 LAB — COMPREHENSIVE METABOLIC PANEL
ALT: 17 U/L (ref 0–35)
AST: 15 U/L (ref 0–37)
Albumin: 4 g/dL (ref 3.5–5.2)
Alkaline Phosphatase: 94 U/L (ref 39–117)
BUN: 22 mg/dL (ref 6–23)
CO2: 31 mEq/L (ref 19–32)
Calcium: 9.3 mg/dL (ref 8.4–10.5)
Chloride: 102 mEq/L (ref 96–112)
Creatinine, Ser: 0.59 mg/dL (ref 0.40–1.20)
GFR: 102.47 mL/min (ref >60.00)
Glucose, Bld: 67 mg/dL — ABNORMAL LOW (ref 70–99)
Potassium: 3.6 mEq/L (ref 3.5–5.1)
Sodium: 141 mEq/L (ref 135–145)
Total Bilirubin: 0.5 mg/dL (ref 0.2–1.2)
Total Protein: 7.8 g/dL (ref 6.0–8.3)

## 2020-12-04 LAB — HEMOGLOBIN A1C: Hgb A1c MFr Bld: 6.1 % (ref 4.6–6.5)

## 2020-12-04 LAB — TSH: TSH: 2.21 u[IU]/mL (ref 0.35–4.50)

## 2020-12-04 MED ORDER — NEBIVOLOL HCL 10 MG PO TABS
10.0000 mg | ORAL_TABLET | Freq: Every day | ORAL | 1 refills | Status: DC
Start: 2020-12-04 — End: 2020-12-04

## 2020-12-04 NOTE — Assessment & Plan Note (Signed)
Chronic Not controlled She sometimes does not take the hydrochlorothiazide because she forgets-she takes that in the afternoon because she cannot take it in the morning because of work and does not want take it at night because of nocturia and stressed trying to take this more regularly Continue hydrochlorothiazide 25 mg daily Use Bystolic to 10 mg daily Stressed low-sodium diet and weight loss Follow-up in 2 months, sooner if blood pressure is poorly controlled at home-she will start monitoring  refer to nutritionist

## 2020-12-04 NOTE — Assessment & Plan Note (Signed)
Chronic She has had back pain in the past and recently it has been increased-located in the central lower back and across the back without radiation Possibly related to osteoarthritis Discussed the importance of weight loss Deferred physical therapy until seeing a specialist Refer to sports medicine Already on diclofenac 75 mg-we will continue Can use ice, heat

## 2020-12-04 NOTE — Assessment & Plan Note (Signed)
Chronic GERD controlled Continue lansoprazole 30 mg daily 

## 2020-12-04 NOTE — Assessment & Plan Note (Signed)
Somewhat chronic Her screening tests for depression and anxiety were both.  She admits she is depressed and anxious, but relates this to the passing of her mother-this is not new Discussed considering medication or seeing a therapist He deferred any treatment at this time-she feels this is getting better

## 2020-12-04 NOTE — Assessment & Plan Note (Signed)
Chronic She did not realize how much she weighed and she understands the importance of losing weight She is active at work and being more active may be difficult because of bilateral knee arthritis and now her back pain Stress changes in her diet-discussed some things to avoid, especially fast food Referred to nutritionist Discussed healthy weight and wellness

## 2020-12-04 NOTE — Assessment & Plan Note (Addendum)
Chronic Following with Dr. Layne Benton Taking diclofenac Wearing braces Encouraged weight loss

## 2020-12-04 NOTE — Assessment & Plan Note (Signed)
Chronic Mild, intermittent Controlled Continue albuterol as needed-she does not use this often

## 2020-12-04 NOTE — Assessment & Plan Note (Signed)
Chronic °Check a1c °Low sugar / carb diet °Stressed regular exercise °Discussed weight loss °

## 2020-12-05 DIAGNOSIS — M170 Bilateral primary osteoarthritis of knee: Secondary | ICD-10-CM | POA: Diagnosis not present

## 2020-12-07 ENCOUNTER — Ambulatory Visit: Payer: 59 | Admitting: Family Medicine

## 2020-12-07 ENCOUNTER — Other Ambulatory Visit: Payer: Self-pay

## 2020-12-07 VITALS — BP 142/88 | HR 66 | Ht 70.0 in | Wt 280.2 lb

## 2020-12-07 DIAGNOSIS — M545 Low back pain, unspecified: Secondary | ICD-10-CM | POA: Diagnosis not present

## 2020-12-07 NOTE — Progress Notes (Signed)
   Subjective:    I'm seeing this patient as a consultation for:  Dr. Quay Burow. Note will be routed back to referring provider/PCP.  CC: Chronic midline low back pain  I, Molly Weber, LAT, ATC, am serving as scribe for Dr. Lynne Leader.  HPI: Pt is a 56 y/o female presenting w/ midline low back pain since 2015.  Pt has a PMHx of arthritis in her back. Pt works in Copy at [employer].  Pt locates pain to middle of low back.  Radiating pain: no LE numbness/tingling: no Aggravating factors: standing, walk, bending over Treatments tried: Diclofenac 75 mg, heat  Diagnostic testing: L-spine MRI- 04/26/16; L-spine XR- 03/03/16  Past medical history, Surgical history, Family history, Social history, Allergies, and medications have been entered into the medical record, reviewed.   Review of Systems: No new headache, visual changes, nausea, vomiting, diarrhea, constipation, dizziness, abdominal pain, skin rash, fevers, chills, night sweats, weight loss, swollen lymph nodes, body aches, joint swelling, muscle aches, chest pain, shortness of breath, mood changes, visual or auditory hallucinations.   Objective:    Vitals:   12/07/20 1134  BP: (!) 142/88  Pulse: 66  SpO2: 99%   General: Well Developed, well nourished, and in no acute distress.  Neuro/Psych: Alert and oriented x3, extra-ocular muscles intact, able to move all 4 extremities, sensation grossly intact. Skin: Warm and dry, no rashes noted.  Respiratory: Not using accessory muscles, speaking in full sentences, trachea midline.  Cardiovascular: Pulses palpable, no extremity edema. Abdomen: Does not appear distended. MSK: L-spine normal-appearing Decreased lumbar motion to extension rotation and flexion. Nontender midline lumbar spine. Tender to palpation lumbar paraspinal musculature. Lower extremity strength is intact in bilateral extremities. Reflexes are equal and normal throughout bilateral lower extremities. Sensation is intact  throughout  Lab and Radiology Results  X-ray images L-spine visible on CT scan of abdomen and pelvis during renal stone study obtained on July 15, 2020 personally independently interpreted today. Diffuse facet DJD and DDD however L2-L3 facet joints bilaterally are the worst appearing per my interpretation.   Impression and Recommendations:    Assessment and Plan: 55 y.o. female with axial back pain chronic.  This is been ongoing for years but worsening somewhat recently.  Pain is multifactorial.  Patient still he does have some facetagenic back pain probably from the L2-3 level.  Additionally some of the pain is due to muscle dysfunction and spasm and weakness of the core musculature.  After discussion plan for trial of physical therapy along with heating pad and TENS unit.  Reassess in about 1 month.  If not better would consider trial of facet injections at L2-3 bilaterally.  If this does not help neck step after that would probably be lumbar MRI to further characterize cause of pain.   PDMP not reviewed this encounter. Orders Placed This Encounter  Procedures  . Ambulatory referral to Physical Therapy    Referral Priority:   Routine    Referral Type:   Physical Medicine    Referral Reason:   Specialty Services Required    Requested Specialty:   Physical Therapy   No orders of the defined types were placed in this encounter.   Discussed warning signs or symptoms. Please see discharge instructions. Patient expresses understanding.   The above documentation has been reviewed and is accurate and complete Lynne Leader, M.D.

## 2020-12-07 NOTE — Patient Instructions (Signed)
Thank you for coming in today.  I've referred you to Physical Therapy.  Let us know if you don't hear from them in one week.  Recheck in about 1 month.   If not better net step is likely face joint injections.   Facet Blocks Patient Information  Description: The facets are joints in the spine between the vertebrae.  Like any joints in the body, facets can become irritated and painful.  Arthritis can also effect the facets.  By injecting steroids and local anesthetic in and around these joints, we can temporarily block the nerve supply to them.  Steroids act directly on irritated nerves and tissues to reduce selling and inflammation which often leads to decreased pain.  Facet blocks may be done anywhere along the spine from the neck to the low back depending upon the location of your pain.   After numbing the skin with local anesthetic (like Novocaine), a small needle is passed onto the facet joints under x-ray guidance.  You may experience a sensation of pressure while this is being done.  The entire block usually lasts about 15-25 minutes.   Conditions which may be treated by facet blocks:   Low back/buttock pain  Neck/shoulder pain  Certain types of headaches  Preparation for the injection:  1. Do not eat any solid food or dairy products within 8 hours of your appointment. 2. You may drink clear liquid up to 3 hours before appointment.  Clear liquids include water, black coffee, juice or soda.  No milk or cream please. 3. You may take your regular medication, including pain medications, with a sip of water before your appointment.  Diabetics should hold regular insulin (if taken separately) and take 1/2 normal NPH dose the morning of the procedure.  Carry some sugar containing items with you to your appointment. 4. A driver must accompany you and be prepared to drive you home after your procedure. 5. Bring all your current medications with you. 6. An IV may be inserted and sedation  may be given at the discretion of the physician. 7. A blood pressure cuff, EKG and other monitors will often be applied during the procedure.  Some patients may need to have extra oxygen administered for a short period. 8. You will be asked to provide medical information, including your allergies and medications, prior to the procedure.  We must know immediately if you are taking blood thinners (like Coumadin/Warfarin) or if you are allergic to IV iodine contrast (dye).  We must know if you could possible be pregnant.  Possible side-effects:   Bleeding from needle site  Infection (rare, may require surgery)  Nerve injury (rare)  Numbness & tingling (temporary)  Difficulty urinating (rare, temporary)  Spinal headache (a headache worse with upright posture)  Light-headedness (temporary)  Pain at injection site (serveral days)  Decreased blood pressure (rare, temporary)  Weakness in arm/leg (temporary)  Pressure sensation in back/neck (temporary)   Call if you experience:   Fever/chills associated with headache or increased back/neck pain  Headache worsened by an upright position  New onset, weakness or numbness of an extremity below the injection site  Hives or difficulty breathing (go to the emergency room)  Inflammation or drainage at the injection site(s)  Severe back/neck pain greater than usual  New symptoms which are concerning to you  Please note:  Although the local anesthetic injected can often make your back or neck feel good for several hours after the injection, the pain will likely return.  It takes 3-7 days for steroids to work.  You may not notice any pain relief for at least one week.  If effective, we will often do a series of 2-3 injections spaced 3-6 weeks apart to maximally decrease your pain.  After the initial series, you may be a candidate for a more permanent nerve block of the facets.  I

## 2020-12-16 ENCOUNTER — Other Ambulatory Visit (HOSPITAL_COMMUNITY): Payer: Self-pay

## 2020-12-18 ENCOUNTER — Other Ambulatory Visit (HOSPITAL_COMMUNITY): Payer: Self-pay

## 2020-12-18 MED FILL — Nebivolol HCl Tab 10 MG (Base Equivalent): ORAL | 90 days supply | Qty: 90 | Fill #0 | Status: AC

## 2021-01-02 ENCOUNTER — Ambulatory Visit: Payer: 59 | Admitting: Physical Therapy

## 2021-01-02 ENCOUNTER — Other Ambulatory Visit: Payer: Self-pay

## 2021-01-02 ENCOUNTER — Encounter: Payer: Self-pay | Admitting: Physical Therapy

## 2021-01-02 DIAGNOSIS — M6281 Muscle weakness (generalized): Secondary | ICD-10-CM | POA: Diagnosis not present

## 2021-01-02 DIAGNOSIS — M545 Low back pain, unspecified: Secondary | ICD-10-CM | POA: Diagnosis not present

## 2021-01-02 DIAGNOSIS — R2689 Other abnormalities of gait and mobility: Secondary | ICD-10-CM

## 2021-01-02 DIAGNOSIS — G8929 Other chronic pain: Secondary | ICD-10-CM

## 2021-01-02 NOTE — Therapy (Signed)
[place], [place], [address] Phone: [phone]   Fax:  [phone]  Physical Therapy Evaluation  Patient Details  Name: Dawn Townsend MRN: [id number] Date of Birth: 21-Jun-1966 Referring Provider (PT): Georgina Snell, MD   Encounter Date: 01/02/2021   PT End of Session - 01/02/21 1015    Visit Number 1    Number of Visits 4    Date for PT Re-Evaluation 02/27/21    PT Start Time 0930    PT Stop Time 1012    PT Time Calculation (min) 42 min    Activity Tolerance Patient tolerated treatment well    Behavior During Therapy [facility] for tasks assessed/performed           Past Medical History:  Diagnosis Date  . Arthritis    knee   . Asthma    no inhaler  . Fibroids   . GERD (gastroesophageal reflux disease)   . Hyperlipidemia   . Hypertension   . SVD (spontaneous vaginal delivery)    x 1  . Tendonitis of foot     Past Surgical History:  Procedure Laterality Date  . ABDOMINAL HYSTERECTOMY Bilateral 06/08/2014   Procedure: HYSTERECTOMY ABDOMINAL/BILATERAL SALPINGECTOMY/POSSIBLE BILATERAL SALPINGO OOPHORECTOMY;  Surgeon: Daria Pastures, MD;  Location: [place] [facility];  Service: Gynecology;  Laterality: Bilateral;  POSSIBLE STAGING  . BILATERAL SALPINGECTOMY Right 06/08/2014   Procedure: BILATERAL SALPINGECTOMY;  Surgeon: Daria Pastures, MD;  Location: [place] [facility];  Service: Gynecology;  Laterality: Right;  . CHOLECYSTECTOMY  2000  . OOPHORECTOMY Left 06/08/2014   Procedure: OOPHORECTOMY;  Surgeon: Daria Pastures, MD;  Location: [place] [facility];  Service: Gynecology;  Laterality: Left;  . WISDOM TOOTH EXTRACTION      There were no vitals filed for this visit.    Subjective Assessment - 01/02/21 0929    Subjective Pt is a 55 y/o female presenting w/ midline low back pain since 2015.  Pt has a PMHx of arthritis in her back and both knees. Pt works in Copy at [employer].  Pt locates pain to middle of low back. MD recommending heat and  TENS.    Pertinent History PMH:OA,asthma, HTN,    How long can you stand comfortably? no more than 30 min    Diagnostic tests MRI 2017 "Diffuse facet DJD and DDD however L2-L3 facet joints bilaterally are the worst appearing    Patient Stated Goals reduce pain    Currently in Pain? 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 or belly    Pain Relieving Factors leaning on a counter, or sitting, laying on her side              [facility] PT Assessment - 01/02/21 0001      Assessment   Medical Diagnosis Chronic LBP    Referring Provider (PT) Georgina Snell, MD    Onset Date/Surgical Date --   chronic since 2015   Next MD Visit 01/09/21      Balance Screen   Has the patient fallen in the past 6 months  No      Home Ecologist residence      Prior Function   Level of Independence Independent    Vocation Full time employment    Vocation Requirements standing work in [employer]    Leisure nothing to report, but did like to go to the park but has not been able to do this since 2015 due to back/knee pain      Cognition   Overall Cognitive Status Within Functional Limits for tasks assessed      Observation/Other Assessments   Focus on Therapeutic Outcomes (FOTO)  36% functiona, goal 54%      Posture/Postural Control   Posture Comments bilat knee valgus, Rt lateral trunk shift      ROM / Strength   AROM / PROM / Strength AROM;Strength      AROM   AROM Assessment Site Lumbar    Lumbar Flexion WNL no pain    Lumbar Extension 50% painful    Lumbar - Right Side Bend 50% no pain    Lumbar - Left Side Bend 50% no pain    Lumbar - Right  Rotation 50% no pain    Lumbar - Left Rotation 50% no pain      Strength   Overall Strength Comments bilat hip/knee strength grossly 4+ except hip flexion 4/5 bilat      Special Tests   Other special tests negative slump test, negative SLR, no immdediate change with lateral shift correction, some releif with seated flexion stretch      Transfers   Transfers Independent with all Transfers      Ambulation/Gait   Gait Comments limited community ambulator, no AD but slower speed and antalgic gait with trunk lean and bilat knee valgus                      Objective measurements completed on examination: See above findings.       [place] Adult PT Treatment/Exercise - 01/02/21 0001      Exercises   Exercises Lumbar      Modalities   Modalities Electrical Stimulation;Moist Heat      Moist Heat Therapy   Number Minutes Moist Heat 15 Minutes    Moist Heat Location Lumbar Spine      Electrical Stimulation   Electrical Stimulation Location lumbar    Electrical Stimulation Action IFC    Electrical Stimulation Parameters to tolerance, 15 min with ehat    Electrical Stimulation Goals Pain                  PT Education - 01/02/21 1015    Education Details HEP,POC,TENS    Person(s) Educated Patient    Methods Explanation;Demonstration;Verbal cues;Handout    Comprehension Verbalized understanding;Need further instruction            PT Short Term Goals - 01/02/21 1019      PT SHORT TERM GOAL #1   Title Pt will be I with initial HEP    Time 4    Period Weeks    Status New    Target Date 01/30/21      PT SHORT TERM GOAL #2   Title --             PT Long Term Goals - 01/02/21 1020      PT LONG TERM GOAL #1   Title Pt will improve FOTO to 54% functional    Time 8    Period  Weeks    Status New    Target Date 02/27/21      PT LONG TERM GOAL #2   Title Pt will be I with advanced HEP     Time 8    Period Weeks    Status New      PT LONG TERM  GOAL #3   Title Will improve lumbar ROM to [facility].    Time 8    Period Weeks    Status New      PT LONG TERM GOAL #4   Title Pt will work a full day and report less than 4/10 pain in low back     Time 8    Period Weeks    Status New      PT LONG TERM GOAL #5   Title -                  Plan - 01/02/21 1016    Clinical Impression Statement Pt presents with signs and symptoms consistent of  chronic low back pain with diffuse DDD and DJD.She has overall decreased ROM, decreased strength, decreased functional mobility, decreased activity tolerance particularly with standing or walking, and increased pain limiting her functional abilities. She will benefit from skilled PT to address her deficits. She relays she can only afford to come to PT a few times so POC written for 4 visits.    Personal Factors and Comorbidities Comorbidity 3+    Comorbidities PMH:OA,asthma, HTN,    Examination-Activity Limitations Carry;Squat;Lift;Stand;Locomotion Level    Examination-Participation Restrictions Cleaning;Community Activity;Driving;Laundry;Occupation;Shop    Stability/Clinical Decision Making Evolving/Moderate complexity    Clinical Decision Making Moderate    Rehab Potential Fair    PT Frequency 1x / week    PT Duration 8 weeks    PT Treatment/Interventions ADLs/Self Care Home Management;Aquatic Therapy;Cryotherapy;Electrical Stimulation;Iontophoresis 4mg /ml Dexamethasone;Moist Heat;Traction;Ultrasound;Gait training;Therapeutic activities;Therapeutic exercise;Neuromuscular re-education;Patient/family education;Manual techniques;Passive range of motion;Dry needling;Joint Manipulations;Spinal Manipulations;Taping    PT Next Visit Plan review and update HEP PRN, modalaties and manual PRN, consider traction    PT Home Exercise Plan Access Code: [email or web address]    Consulted and Agree with Plan of Care Patient           Patient will benefit from skilled therapeutic intervention in order to improve the  following deficits and impairments:  Abnormal gait,Decreased activity tolerance,Decreased endurance,Decreased range of motion,Decreased strength,Difficulty walking,Postural dysfunction,Pain,Obesity  Visit Diagnosis: Chronic bilateral low back pain without sciatica  Muscle weakness (generalized)  Other abnormalities of gait and mobility     Problem List Patient Active Problem List   Diagnosis Date Noted  . Adjustment disorder with mixed anxiety and depressed mood 12/04/2020  . COVID 09/22/2020  . Hair loss 09/18/2018  . Bumps on skin 09/18/2018  . Seborrheic dermatitis of scalp 06/10/2018  . Cardiac murmur 02/27/2018  . Ankle edema, bilateral 02/27/2018  . 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 Prediabetes 10/15/2016  . Asthma 06/27/2016  . GERD (gastroesophageal reflux disease) 06/27/2016  . Uncontrolled hypertension 06/27/2016  . Morbid obesity ([place]) 06/27/2016  . Family history of diabetes mellitus (DM) 06/27/2016  . Eczema 06/27/2016  . Chronic lower back pain 06/27/2016  . Bilateral knee pain 06/27/2016    Silvestre Mesi 01/02/2021, 10:22 AM  [facility] Physical Therapy [address] [place], [place], [address] Phone: [phone]   Fax:  [phone]  Name: CELITA ARON MRN: [id number] Date of Birth: [birth year]

## 2021-01-02 NOTE — Patient Instructions (Signed)
Access Code: [email or web address] URL: [email or web address] Date: 01/02/2021 Prepared by: Elsie Ra  Exercises Right Standing Lateral Shift Correction at [place] - 2 x daily - 6 x weekly - 1-3 sets - 10 reps Seated Lumbar Flexion Stretch - 2 x daily - 6 x weekly - 1-3 sets - 10 reps - 5 hold Hooklying Single Knee to Chest Stretch - 2 x daily - 6 x weekly - 1 sets - 2 reps - 30 hold Supine March - 2 x daily - 6 x weekly - 1-2 sets - 10 reps Supine Lower Trunk Rotation - 2 x daily - 6 x weekly - 1 sets - 10 reps - 5 sec hold Standing Posterior Pelvic Tilt - 2 x daily - 6 x weekly - 2 sets - 10 reps  Patient Education TENS Therapy

## 2021-01-04 ENCOUNTER — Other Ambulatory Visit: Payer: Self-pay | Admitting: Sports Medicine

## 2021-01-04 ENCOUNTER — Other Ambulatory Visit (HOSPITAL_COMMUNITY): Payer: Self-pay

## 2021-01-04 MED FILL — Montelukast Sodium Tab 10 MG (Base Equiv): ORAL | 90 days supply | Qty: 90 | Fill #0 | Status: AC

## 2021-01-05 ENCOUNTER — Other Ambulatory Visit (HOSPITAL_COMMUNITY): Payer: Self-pay

## 2021-01-08 ENCOUNTER — Other Ambulatory Visit (HOSPITAL_COMMUNITY): Payer: Self-pay

## 2021-01-08 MED ORDER — DICLOFENAC SODIUM 75 MG PO TBEC
75.0000 mg | DELAYED_RELEASE_TABLET | Freq: Two times a day (BID) | ORAL | 2 refills | Status: DC
Start: 2021-01-05 — End: 2021-04-17
  Filled 2021-01-08: qty 60, 30d supply, fill #0
  Filled 2021-02-09: qty 60, 30d supply, fill #1
  Filled 2021-03-09: qty 60, 30d supply, fill #2

## 2021-01-09 ENCOUNTER — Ambulatory Visit: Payer: 59 | Admitting: Family Medicine

## 2021-01-16 ENCOUNTER — Ambulatory Visit: Payer: 59 | Admitting: Physical Therapy

## 2021-01-16 ENCOUNTER — Encounter: Payer: Self-pay | Admitting: Physical Therapy

## 2021-01-16 ENCOUNTER — Other Ambulatory Visit: Payer: Self-pay

## 2021-01-16 DIAGNOSIS — R2689 Other abnormalities of gait and mobility: Secondary | ICD-10-CM | POA: Diagnosis not present

## 2021-01-16 DIAGNOSIS — M545 Low back pain, unspecified: Secondary | ICD-10-CM | POA: Diagnosis not present

## 2021-01-16 DIAGNOSIS — M6281 Muscle weakness (generalized): Secondary | ICD-10-CM

## 2021-01-16 DIAGNOSIS — G8929 Other chronic pain: Secondary | ICD-10-CM | POA: Diagnosis not present

## 2021-01-16 NOTE — Patient Instructions (Signed)

## 2021-01-16 NOTE — Therapy (Signed)
[facility] Physical Therapy [address] [place], [place], [address] Phone: [phone]   Fax:  [phone]  Physical Therapy Treatment  Patient Details  Name: Dawn Townsend MRN: [id number] Date of Birth: [birth year] Referring Provider (PT): Georgina Snell, MD   Encounter Date: 01/16/2021   PT End of Session - 01/16/21 1037    Visit Number 2    Number of Visits 4    Date for PT Re-Evaluation 02/27/21    PT Start Time 1015    PT Stop Time 1100    PT Time Calculation (min) 45 min    Activity Tolerance Patient tolerated treatment well    Behavior During Therapy [facility] for tasks assessed/performed           Past Medical History:  Diagnosis Date  . Arthritis    knee   . Asthma    no inhaler  . Fibroids   . GERD (gastroesophageal reflux disease)   . Hyperlipidemia   . Hypertension   . SVD (spontaneous vaginal delivery)    x 1  . Tendonitis of foot     Past Surgical History:  Procedure Laterality Date  . ABDOMINAL HYSTERECTOMY Bilateral 06/08/2014   Procedure: HYSTERECTOMY ABDOMINAL/BILATERAL SALPINGECTOMY/POSSIBLE BILATERAL SALPINGO OOPHORECTOMY;  Surgeon: Daria Pastures, MD;  Location: [place] [facility];  Service: Gynecology;  Laterality: Bilateral;  POSSIBLE STAGING  . BILATERAL SALPINGECTOMY Right 06/08/2014   Procedure: BILATERAL SALPINGECTOMY;  Surgeon: Daria Pastures, MD;  Location: [place] [facility];  Service: Gynecology;  Laterality: Right;  . CHOLECYSTECTOMY  2000  . OOPHORECTOMY Left 06/08/2014   Procedure: OOPHORECTOMY;  Surgeon: Daria Pastures, MD;  Location: [place] [facility];  Service: Gynecology;  Laterality: Left;  . WISDOM TOOTH EXTRACTION      There were no vitals filed for this visit.   Subjective Assessment - 01/16/21 1036    Subjective Pt arriving today reporting 8/10 pain in her low back. Pt reported the E-stim seemed to help short term last visit.    Pertinent History PMH:OA,asthma, HTN,    How long can you stand comfortably? no more than 30 min     Diagnostic tests MRI 2017 "Diffuse facet DJD and DDD however L2-L3 facet joints bilaterally are the worst appearing    Currently in Pain? Yes    Pain Score 8     Pain Location Back    Pain Orientation Mid;Lower    Pain Descriptors / Indicators Aching    Pain Type Chronic pain                             [facility] Adult PT Treatment/Exercise - 01/16/21 0001      Exercises   Exercises Lumbar      Lumbar Exercises: Stretches   Active Hamstring Stretch Right;Left;2 reps;20 seconds    Single Knee to Chest Stretch Right;Left;10 seconds;3 reps      Lumbar Exercises: Aerobic   Nustep L5 x 8 minutes      Lumbar Exercises: Seated   Sit to Stand 10 reps    Other Seated Lumbar Exercises trunk rotation x 3 each side holding 10 seconds each      Modalities   Modalities Electrical Stimulation;Moist Heat      Moist Heat Therapy   Number Minutes Moist Heat 15 Minutes    Moist Heat Location Lumbar Spine      Electrical Stimulation   Electrical Stimulation Location lumbar paraspinals    Electrical Stimulation Action IFC  Electrical Stimulation Parameters 15 minutes intensity to tolerance    Electrical Stimulation Goals Pain                    PT Short Term Goals - 01/16/21 1044      PT SHORT TERM GOAL #1   Title Pt will be I with initial HEP    Status On-going             PT Long Term Goals - 01/16/21 1044      PT LONG TERM GOAL #1   Title Pt will improve FOTO to 54% functional    Status On-going      PT LONG TERM GOAL #2   Title Pt will be I with advanced HEP     Status On-going      PT LONG TERM GOAL #3   Title Will improve lumbar ROM to [facility].    Status On-going      PT LONG TERM GOAL #4   Title Pt will work a full day and report less than 4/10 pain in low back     Status On-going                 Plan - 01/16/21 1038    Clinical Impression Statement Pt presenting today with 8/10 pain in her mid-low back. Pt tolerating exercises  well with no reports of increasd pain. Today exercises performed in sitting due to pt unable to tolerate supine position on mat table. Pt with good response to E-stim and moist heat. Issued pt handout for home TENS unit. Continue skilled PT.    Personal Factors and Comorbidities Comorbidity 3+    Comorbidities PMH:OA,asthma, HTN,    Examination-Activity Limitations Carry;Squat;Lift;Stand;Locomotion Level    Examination-Participation Restrictions Cleaning;Community Activity;Driving;Laundry;Occupation;Shop    Stability/Clinical Decision Making Evolving/Moderate complexity    Rehab Potential Fair    PT Frequency 1x / week    PT Duration 8 weeks    PT Treatment/Interventions ADLs/Self Care Home Management;Aquatic Therapy;Cryotherapy;Electrical Stimulation;Iontophoresis 4mg /ml Dexamethasone;Moist Heat;Traction;Ultrasound;Gait training;Therapeutic activities;Therapeutic exercise;Neuromuscular re-education;Patient/family education;Manual techniques;Passive range of motion;Dry needling;Joint Manipulations;Spinal Manipulations;Taping    PT Next Visit Plan review and update HEP PRN, modalaties and manual PRN, consider traction    PT Home Exercise Plan Access Code: [email or web address]    Consulted and Agree with Plan of Care Patient           Patient will benefit from skilled therapeutic intervention in order to improve the following deficits and impairments:  Abnormal gait,Decreased activity tolerance,Decreased endurance,Decreased range of motion,Decreased strength,Difficulty walking,Postural dysfunction,Pain,Obesity  Visit Diagnosis: Chronic bilateral low back pain without sciatica  Muscle weakness (generalized)  Other abnormalities of gait and mobility     Problem List Patient Active Problem List   Diagnosis Date Noted  . Adjustment disorder with mixed anxiety and depressed mood 12/04/2020  . COVID 09/22/2020  . Hair loss 09/18/2018  . Bumps on skin 09/18/2018  . Seborrheic dermatitis of scalp  06/10/2018  . Cardiac murmur 02/27/2018  . Ankle edema, bilateral 02/27/2018  . 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 Prediabetes 10/15/2016  . Asthma 06/27/2016  . GERD (gastroesophageal reflux disease) 06/27/2016  . Uncontrolled hypertension 06/27/2016  . Morbid obesity ([place]) 06/27/2016  . Family history of diabetes mellitus (DM) 06/27/2016  . Eczema 06/27/2016  . Chronic lower back pain 06/27/2016  . Bilateral knee pain 06/27/2016    Oretha Caprice, PT, MPT 01/16/2021, 10:50 AM  [facility] Physical Therapy [address] [place], [place], [address] Phone: [phone]  Fax:  [phone]  Name: Dawn Townsend MRN: [id number] Date of Birth: 12-25-1965

## 2021-01-30 ENCOUNTER — Encounter: Payer: 59 | Admitting: Physical Therapy

## 2021-02-06 ENCOUNTER — Ambulatory Visit: Payer: 59 | Admitting: Internal Medicine

## 2021-02-09 ENCOUNTER — Other Ambulatory Visit (HOSPITAL_COMMUNITY): Payer: Self-pay

## 2021-02-09 MED FILL — Lansoprazole Cap Delayed Release 30 MG: ORAL | 90 days supply | Qty: 90 | Fill #0 | Status: AC

## 2021-02-13 ENCOUNTER — Ambulatory Visit: Payer: 59 | Admitting: Physical Therapy

## 2021-02-13 ENCOUNTER — Other Ambulatory Visit: Payer: Self-pay

## 2021-02-13 DIAGNOSIS — R2689 Other abnormalities of gait and mobility: Secondary | ICD-10-CM | POA: Diagnosis not present

## 2021-02-13 DIAGNOSIS — M6281 Muscle weakness (generalized): Secondary | ICD-10-CM

## 2021-02-13 DIAGNOSIS — G8929 Other chronic pain: Secondary | ICD-10-CM | POA: Diagnosis not present

## 2021-02-13 DIAGNOSIS — M545 Low back pain, unspecified: Secondary | ICD-10-CM | POA: Diagnosis not present

## 2021-02-13 NOTE — Therapy (Addendum)
[facility] Physical Therapy [address] [place], [place], [address] Phone: [phone]   Fax:  [phone]  Physical Therapy Treatment Discharge  Patient Details  Name: Dawn Townsend MRN: [id number] Date of Birth: [birth year] Referring Provider (PT): Georgina Snell, MD   Encounter Date: 02/13/2021   PT End of Session - 02/13/21 1045     Visit Number 3    Number of Visits 4    Date for PT Re-Evaluation 02/27/21    PT Start Time 1017    PT Stop Time 1105    PT Time Calculation (min) 48 min    Activity Tolerance Patient tolerated treatment well    Behavior During Therapy [facility] for tasks assessed/performed             Past Medical History:  Diagnosis Date   Arthritis    knee    Asthma    no inhaler   Fibroids    GERD (gastroesophageal reflux disease)    Hyperlipidemia    Hypertension    SVD (spontaneous vaginal delivery)    x 1   Tendonitis of foot     Past Surgical History:  Procedure Laterality Date   ABDOMINAL HYSTERECTOMY Bilateral 06/08/2014   Procedure: HYSTERECTOMY ABDOMINAL/BILATERAL SALPINGECTOMY/POSSIBLE BILATERAL SALPINGO OOPHORECTOMY;  Surgeon: Daria Pastures, MD;  Location: [place] [facility];  Service: Gynecology;  Laterality: Bilateral;  POSSIBLE STAGING   BILATERAL SALPINGECTOMY Right 06/08/2014   Procedure: BILATERAL SALPINGECTOMY;  Surgeon: Daria Pastures, MD;  Location: [place] [facility];  Service: Gynecology;  Laterality: Right;   CHOLECYSTECTOMY  2000   OOPHORECTOMY Left 06/08/2014   Procedure: OOPHORECTOMY;  Surgeon: Daria Pastures, MD;  Location: [place] [facility];  Service: Gynecology;  Laterality: Left;   WISDOM TOOTH EXTRACTION      There were no vitals filed for this visit.   Subjective Assessment - 02/13/21 1026     Subjective She relays overall 8/10 back pain but does relay the therapy is helping and she requests to have the E-stim again.    Pertinent History PMH:OA,asthma, HTN,    How long can you stand comfortably? no more than 30 min     Diagnostic tests MRI 2017 "Diffuse facet DJD and DDD however L2-L3 facet joints bilaterally are the worst appearing    Patient Stated Goals reduce pain    Pain Onset More than a month ago                [facility] PT Assessment - 02/13/21 0001       Assessment   Medical Diagnosis Chronic LBP    Referring Provider (PT) Georgina Snell, MD      AROM   Overall AROM Comments lumbar ROM WFL      Strength   Overall Strength Comments bilat hip/knee strength grossly 4+ except hip flexion 4/5 bilat              [facility] Adult PT Treatment/Exercise - 02/13/21 0001       Lumbar Exercises: Stretches   Active Hamstring Stretch Right;Left;2 reps;20 seconds    Single Knee to Chest Stretch Right;Left;10 seconds;3 reps    Lower Trunk Rotation 5 reps;10 seconds      Lumbar Exercises: Aerobic   Nustep L5 x 8 minutes      Lumbar Exercises: Standing   Row Both;20 reps    Theraband Level (Row) Level 3 (Green)    Shoulder Extension Both;20 reps    Theraband Level (Shoulder Extension) Level 3 (Green)      Lumbar Exercises: Supine  Bridge 5 seconds    Bridge Limitations 2X10      Moist Heat Therapy   Number Minutes Moist Heat 15 Minutes    Moist Heat Location Lumbar Spine      Electrical Stimulation   Electrical Stimulation Location lumbar paraspinals    Electrical Stimulation Action IFC    Electrical Stimulation Parameters to tolerance, 15 min with heat    Electrical Stimulation Goals Pain                      PT Short Term Goals - 01/16/21 1044       PT SHORT TERM GOAL #1   Title Pt will be I with initial HEP    Status On-going               PT Long Term Goals - 02/13/21 1059       PT LONG TERM GOAL #1   Title Pt will improve FOTO to 54% functional    Baseline 57% today    Status Achieved      PT LONG TERM GOAL #2   Title Pt will be I with advanced HEP     Baseline progressed and updated today    Status On-going      PT LONG TERM GOAL #3   Title Will  improve lumbar ROM to [facility].    Baseline now met    Status Achieved      PT LONG TERM GOAL #4   Title Pt will work a full day and report less than 4/10 pain in low back     Baseline still can be 8/10    Status On-going                   Plan - 02/13/21 1046     Clinical Impression Statement We added rows and shoulder extensions with green band today with focus on core contraction during. She like these exercises so we gave her green band to take home and add this into her HEP. At this point she wants to hold PT to see how she does at home with the exercises and she will get home TENS unit. She will not schedule any more visits at this time but she was encouraged to return if she feels she needs to and that we will close her episode of care out if she has not returned in 60 days.    Personal Factors and Comorbidities Comorbidity 3+    Comorbidities PMH:OA,asthma, HTN,    Examination-Activity Limitations Carry;Squat;Lift;Stand;Locomotion Level    Examination-Participation Restrictions Cleaning;Community Activity;Driving;Laundry;Occupation;Shop    Stability/Clinical Decision Making Evolving/Moderate complexity    Rehab Potential Fair    PT Frequency 1x / week    PT Duration 8 weeks    PT Treatment/Interventions ADLs/Self Care Home Management;Aquatic Therapy;Cryotherapy;Electrical Stimulation;Iontophoresis 1m/ml Dexamethasone;Moist Heat;Traction;Ultrasound;Gait training;Therapeutic activities;Therapeutic exercise;Neuromuscular re-education;Patient/family education;Manual techniques;Passive range of motion;Dry needling;Joint Manipulations;Spinal Manipulations;Taping    PT Next Visit Plan hold PT 60 days to trial independent program.    PT Home Exercise Plan Access Code: [email or web address]   Consulted and Agree with Plan of Care Patient             Patient will benefit from skilled therapeutic intervention in order to improve the following deficits and impairments:  Abnormal gait,Decreased  activity tolerance,Decreased endurance,Decreased range of motion,Decreased strength,Difficulty walking,Postural dysfunction,Pain,Obesity  Visit Diagnosis: Chronic bilateral low back pain without sciatica  Muscle weakness (generalized)  Other abnormalities of gait and mobility  Problem List Patient Active Problem List   Diagnosis Date Noted   Adjustment disorder with mixed anxiety and depressed mood 12/04/2020   COVID 09/22/2020   Hair loss 09/18/2018   Bumps on skin 09/18/2018   Seborrheic dermatitis of scalp 06/10/2018   Cardiac murmur 02/27/2018   Ankle edema, bilateral 02/27/2018   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   Prediabetes 10/15/2016   Asthma 06/27/2016   GERD (gastroesophageal reflux disease) 06/27/2016   Uncontrolled hypertension 06/27/2016   Morbid obesity ([place]) 06/27/2016   Family history of diabetes mellitus (DM) 06/27/2016   Eczema 06/27/2016   Chronic lower back pain 06/27/2016   Bilateral knee pain 06/27/2016   PHYSICAL THERAPY DISCHARGE SUMMARY  Visits from Start of Care: 3  Current functional level related to goals / functional outcomes: See above   Remaining deficits: See above   Education / Equipment: HEP   Patient agrees to discharge. Patient goals were not met. Patient is being discharged due to not returning since the last visit.  Silvestre Mesi 02/13/2021, 11:00 AM  [facility] Physical Therapy [address] [place], [place], [address] Phone: [phone]   Fax:  [phone]  Name: ADALAIDE JASKOLSKI MRN: [id number] Date of Birth: 11-Jun-1966  Kearney Hard, PT, MPT 04/24/21 2:45 PM

## 2021-02-26 NOTE — Patient Instructions (Addendum)
   Medications changes include :   none   Your prescription(s) have been submitted to your pharmacy. Please take as directed and contact our office if you believe you are having problem(s) with the medication(s).     Please followup in 4 months

## 2021-02-26 NOTE — Progress Notes (Signed)
Subjective:    Patient ID: Dawn Townsend, female    DOB: 04-Aug-1966, 55 y.o.   MRN: [id number]  HPI The patient is here for follow up of their chronic medical problems, including htn, prediabetes, gerd, asthma  She is taking all of her medications as prescribed.   She is having bad hot flashes.  She is due for her gyn visit.  She did not sleep last night due to the hotflashes.   She is working on decreasing her sweets. She can still do better.    Medications and allergies reviewed with patient and updated if appropriate.  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 18 g 5   Cholecalciferol (VITAMIN D PO) Take by mouth.     diclofenac (VOLTAREN) 75 MG EC tablet Take 1 tablet by mouth twice a day 60 tablet 2   hydrochlorothiazide (HYDRODIURIL) 25 MG tablet TAKE 1 TABLET (25 MG TOTAL) BY MOUTH DAILY. 90 tablet 3   lansoprazole (PREVACID) 30 MG capsule TAKE 1 CAPSULE (30 MG TOTAL) BY MOUTH DAILY BEFORE BREAKFAST. 90 capsule 1   montelukast (SINGULAIR) 10 MG tablet TAKE 1 TABLET (10 MG TOTAL) BY MOUTH AT BEDTIME. 90 tablet 1   nebivolol (BYSTOLIC) 10 MG tablet TAKE 1 TABLET (10 MG  TOTAL) BY MOUTH DAILY. 90 tablet 1   No current facility-administered medications on file prior to visit.    Past Medical History:  Diagnosis Date   Arthritis    knee    Asthma    no inhaler   Fibroids    GERD (gastroesophageal reflux disease)    Hyperlipidemia    Hypertension    SVD (spontaneous vaginal delivery)    x 1   Tendonitis of foot     Past Surgical History:  Procedure Laterality Date   ABDOMINAL HYSTERECTOMY Bilateral 06/08/2014   Procedure: HYSTERECTOMY ABDOMINAL/BILATERAL SALPINGECTOMY/POSSIBLE BILATERAL SALPINGO OOPHORECTOMY;  Surgeon: Daria Pastures, MD;  Location: [facility];  Service: Gynecology;  Laterality: Bilateral;  POSSIBLE STAGING   BILATERAL SALPINGECTOMY Right 06/08/2014   Procedure: BILATERAL SALPINGECTOMY;  Surgeon: Daria Pastures, MD;  Location: [facility];  Service: Gynecology;  Laterality: Right;   CHOLECYSTECTOMY  2000   OOPHORECTOMY Left 06/08/2014   Procedure: OOPHORECTOMY;  Surgeon: Daria Pastures, MD;  Location: [facility];  Service: Gynecology;  Laterality: Left;   WISDOM TOOTH EXTRACTION      Social History   Socioeconomic History   Marital status: Single    Spouse name: Not on file   Number of children: Not on file   Years of education: Not on file   Highest education level: Not on file  Occupational History   Not  on file  Tobacco Use   Smoking status: Never   Smokeless tobacco: Never  Substance and Sexual Activity   Alcohol use: No   Drug use: No   Sexual activity: Not Currently    Birth control/protection: None  Other Topics Concern   Not on file  Social History Narrative   Not on file   Social Determinants of Health   Financial Resource Strain: Not on file  Food Insecurity: Not on file  Transportation Needs: Not on file  Physical Activity: Not on file  Stress: Not on file  Social Connections: Not on file    Family History  Problem Relation Age of Onset   Arthritis Mother    Heart disease Mother    Hypertension  Mother    Diabetes Mother    Arthritis Father    Heart disease Father    Hypertension Father    Diabetes Father    Arthritis Maternal Grandmother    Arthritis Maternal Grandfather    Arthritis Paternal Grandmother    Arthritis Paternal Grandfather     Review of Systems  Constitutional:  Negative for fever.       Hot flashes  Respiratory:  Positive for shortness of breath (with moderate acitivity). Negative for cough and wheezing.   Cardiovascular:  Positive for chest pain (rare - 2 sec pain - likely msk) and leg swelling. Negative for palpitations.  Neurological:  Negative for light-headedness and headaches (occ).      Objective:   Vitals:   02/27/21 1015  BP: (!) 142/92  Pulse: 61  Temp: 98.3 F (36.8 C)  SpO2: 99%   BP Readings from Last 3 Encounters:  02/27/21 (!) 142/92  12/07/20 (!) 142/88  12/04/20 (!) 160/90   Wt Readings from Last 3 Encounters:  02/27/21 294 lb (133.4 kg)  12/07/20 280 lb 3.2 oz (127.1 kg)  12/04/20 294 lb (133.4 kg)   Body mass index is 42.18 kg/m.   Physical Exam    Constitutional: Appears well-developed and well-nourished. No distress.  HENT:  Head: Normocephalic and atraumatic.  Neck: Neck supple. No tracheal deviation present. No thyromegaly present.  No cervical lymphadenopathy Cardiovascular: Normal rate, regular rhythm and normal heart sounds.   No murmur heard. No carotid bruit .  No edema Pulmonary/Chest: Effort normal and breath sounds normal. No respiratory distress. No has no wheezes. No rales.  Skin: Skin is warm and dry. Not diaphoretic.  Psychiatric: Normal mood and affect. Behavior is normal.      Assessment & Plan:    See Problem List for Assessment and Plan of chronic medical problems.    This visit occurred during the SARS-CoV-2 public health emergency.  Safety protocols were in place, including screening questions prior to the visit, additional usage of staff PPE, and extensive cleaning of exam room while  observing appropriate contact time as indicated for disinfecting solutions.

## 2021-02-27 ENCOUNTER — Ambulatory Visit: Payer: 59 | Admitting: Internal Medicine

## 2021-02-27 ENCOUNTER — Encounter: Payer: Self-pay | Admitting: Internal Medicine

## 2021-02-27 ENCOUNTER — Other Ambulatory Visit: Payer: Self-pay

## 2021-02-27 VITALS — BP 142/92 | HR 61 | Temp 98.3°F | Ht 70.0 in | Wt 294.0 lb

## 2021-02-27 DIAGNOSIS — J45909 Unspecified asthma, uncomplicated: Secondary | ICD-10-CM

## 2021-02-27 DIAGNOSIS — K219 Gastro-esophageal reflux disease without esophagitis: Secondary | ICD-10-CM | POA: Diagnosis not present

## 2021-02-27 DIAGNOSIS — R7303 Prediabetes: Secondary | ICD-10-CM | POA: Diagnosis not present

## 2021-02-27 DIAGNOSIS — R232 Flushing: Secondary | ICD-10-CM | POA: Diagnosis not present

## 2021-02-27 DIAGNOSIS — I10 Essential (primary) hypertension: Secondary | ICD-10-CM

## 2021-02-27 NOTE — Assessment & Plan Note (Signed)
Chronic Stressed the importance of weight loss Stressed starting with exercising 10 minutes 5 days a week and increasing from there Discussed in detail changes she needs to make in her diet-decrease sweets, decrease snacking, substitute more healthy options for some of the things she is eating, increase lean protein and vegetables. Discussed that I can refer her to a nutritionist for the healthy weight and wellness clinic

## 2021-02-27 NOTE — Assessment & Plan Note (Signed)
Chronic Not ideally controlled Discussed goal She would like to avoid more medication and will start monitoring her blood pressure at home Stressed low-sodium diet, regular exercise and weight loss Continue hydrochlorothiazide 25 mg daily, Bystolic 10 mg daily

## 2021-02-27 NOTE — Assessment & Plan Note (Addendum)
Chronic Check a1c Low sugar / carb diet Stressed regular exercise Stressed weight loss 

## 2021-02-27 NOTE — Assessment & Plan Note (Signed)
Acute This is new She is experiencing frequent hot flashes that is affecting her quality of life-last night she could barely sleep because of them I did review some of the options for treatment of hot flashes such as hormone replacement, SSRIs, natural supplements Stressed regular exercise and weight loss Discussed some triggers for She will follow-up with her gynecologist

## 2021-02-27 NOTE — Assessment & Plan Note (Signed)
Chronic Mild, intermittent Controlled Continue singulair 10 mg daily Continue albuterol inhaler as needed

## 2021-02-27 NOTE — Assessment & Plan Note (Signed)
Chronic GERD controlled Continue lansoprazole 30 mg qd

## 2021-03-09 ENCOUNTER — Other Ambulatory Visit (HOSPITAL_COMMUNITY): Payer: Self-pay

## 2021-03-13 ENCOUNTER — Other Ambulatory Visit (HOSPITAL_COMMUNITY): Payer: Self-pay

## 2021-03-15 ENCOUNTER — Other Ambulatory Visit (HOSPITAL_COMMUNITY): Payer: Self-pay

## 2021-03-15 MED FILL — Nebivolol HCl Tab 10 MG (Base Equivalent): ORAL | 90 days supply | Qty: 90 | Fill #1 | Status: AC

## 2021-04-16 ENCOUNTER — Other Ambulatory Visit (HOSPITAL_COMMUNITY): Payer: Self-pay

## 2021-04-16 ENCOUNTER — Other Ambulatory Visit: Payer: Self-pay | Admitting: Internal Medicine

## 2021-04-16 MED ORDER — MONTELUKAST SODIUM 10 MG PO TABS
10.0000 mg | ORAL_TABLET | Freq: Every day | ORAL | 0 refills | Status: DC
  Filled 2021-04-16: qty 90, 90d supply, fill #0

## 2021-04-17 ENCOUNTER — Other Ambulatory Visit (HOSPITAL_COMMUNITY): Payer: Self-pay

## 2021-04-17 MED ORDER — DICLOFENAC SODIUM 75 MG PO TBEC
75.0000 mg | DELAYED_RELEASE_TABLET | Freq: Two times a day (BID) | ORAL | 2 refills | Status: DC
  Filled 2021-04-17: qty 60, 30d supply, fill #0
  Filled 2021-05-18: qty 60, 30d supply, fill #1
  Filled 2021-06-19: qty 60, 30d supply, fill #2

## 2021-04-17 MED ORDER — DICLOFENAC SODIUM 75 MG PO TBEC
75.0000 mg | DELAYED_RELEASE_TABLET | Freq: Two times a day (BID) | ORAL | 0 refills | Status: DC
  Filled 2021-04-17: qty 60, 30d supply, fill #0

## 2021-05-18 ENCOUNTER — Other Ambulatory Visit (HOSPITAL_COMMUNITY): Payer: Self-pay

## 2021-05-18 ENCOUNTER — Other Ambulatory Visit: Payer: Self-pay | Admitting: Internal Medicine

## 2021-05-18 MED ORDER — LANSOPRAZOLE 30 MG PO CPDR
30.0000 mg | DELAYED_RELEASE_CAPSULE | Freq: Every morning | ORAL | 1 refills | Status: DC
  Filled 2021-05-18: qty 90, 90d supply, fill #0
  Filled 2021-08-16: qty 90, 90d supply, fill #1

## 2021-05-18 MED FILL — Hydrochlorothiazide Tab 25 MG: ORAL | 90 days supply | Qty: 90 | Fill #0 | Status: AC

## 2021-05-22 DIAGNOSIS — M170 Bilateral primary osteoarthritis of knee: Secondary | ICD-10-CM | POA: Diagnosis not present

## 2021-06-19 ENCOUNTER — Other Ambulatory Visit (HOSPITAL_COMMUNITY): Payer: Self-pay

## 2021-06-19 ENCOUNTER — Other Ambulatory Visit: Payer: Self-pay | Admitting: Internal Medicine

## 2021-06-19 MED ORDER — NEBIVOLOL HCL 10 MG PO TABS
10.0000 mg | ORAL_TABLET | Freq: Every day | ORAL | 1 refills | Status: DC
  Filled 2021-06-19 – 2021-06-28 (×2): qty 90, 90d supply, fill #0
  Filled 2021-09-21: qty 90, 90d supply, fill #1

## 2021-06-27 ENCOUNTER — Other Ambulatory Visit (HOSPITAL_COMMUNITY): Payer: Self-pay

## 2021-06-28 ENCOUNTER — Other Ambulatory Visit (HOSPITAL_COMMUNITY): Payer: Self-pay

## 2021-06-28 MED ORDER — AMOXICILLIN 500 MG PO CAPS
500.0000 mg | ORAL_CAPSULE | Freq: Three times a day (TID) | ORAL | 0 refills | Status: DC
  Filled 2021-06-28: qty 21, 7d supply, fill #0

## 2021-06-28 MED ORDER — IBUPROFEN 600 MG PO TABS
600.0000 mg | ORAL_TABLET | Freq: Four times a day (QID) | ORAL | 0 refills | Status: DC | PRN
  Filled 2021-06-28: qty 30, 8d supply, fill #0

## 2021-06-29 ENCOUNTER — Other Ambulatory Visit (HOSPITAL_COMMUNITY): Payer: Self-pay

## 2021-07-02 NOTE — Patient Instructions (Addendum)
Schedule your mammogram, schedule with your gynecologist.    Flu immunization administered today.     Blood work was ordered.     Medications changes include :  none    A thyroid ultrasound was ordered.  Someone will call you to schedule an appointment.    Please followup in 6 months   Health Maintenance, Female Adopting a healthy lifestyle and getting preventive care are important in promoting health and wellness. Ask your health care provider about: The right schedule for you to have regular tests and exams. Things you can do on your own to prevent diseases and keep yourself healthy. What should I know about diet, weight, and exercise? Eat a healthy diet  Eat a diet that includes plenty of vegetables, fruits, low-fat dairy products, and lean protein. Do not eat a lot of foods that are high in solid fats, added sugars, or sodium. Maintain a healthy weight Body mass index (BMI) is used to identify weight problems. It estimates body fat based on height and weight. Your health care provider can help determine your BMI and help you achieve or maintain a healthy weight. Get regular exercise Get regular exercise. This is one of the most important things you can do for your health. Most adults should: Exercise for at least 150 minutes each week. The exercise should increase your heart rate and make you sweat (moderate-intensity exercise). Do strengthening exercises at least twice a week. This is in addition to the moderate-intensity exercise. Spend less time sitting. Even light physical activity can be beneficial. Watch cholesterol and blood lipids Have your blood tested for lipids and cholesterol at 54 years of age, then have this test every 5 years. Have your cholesterol levels checked more often if: Your lipid or cholesterol levels are high. You are older than 54 years of age. You are at high risk for heart disease. What should I know about cancer screening? Depending on  your health history and family history, you may need to have cancer screening at various ages. This may include screening for: Breast cancer. Cervical cancer. Colorectal cancer. Skin cancer. Lung cancer. What should I know about heart disease, diabetes, and high blood pressure? Blood pressure and heart disease High blood pressure causes heart disease and increases the risk of stroke. This is more likely to develop in people who have high blood pressure readings, are of African descent, or are overweight. Have your blood pressure checked: Every 3-5 years if you are 33-20 years of age. Every year if you are 58 years old or older. Diabetes Have regular diabetes screenings. This checks your fasting blood sugar level. Have the screening done: Once every three years after age 47 if you are at a normal weight and have a low risk for diabetes. More often and at a younger age if you are overweight or have a high risk for diabetes. What should I know about preventing infection? Hepatitis B If you have a higher risk for hepatitis B, you should be screened for this virus. Talk with your health care provider to find out if you are at risk for hepatitis B infection. Hepatitis C Testing is recommended for: Everyone born from [birth year] through 1964. Anyone with known risk factors for hepatitis C. Sexually transmitted infections (STIs) Get screened for STIs, including gonorrhea and chlamydia, if: You are sexually active and are younger than 54 years of age. You are older than 54 years of age and your health care provider tells you that you are  at risk for this type of infection. Your sexual activity has changed since you were last screened, and you are at increased risk for chlamydia or gonorrhea. Ask your health care provider if you are at risk. Ask your health care provider about whether you are at high risk for HIV. Your health care provider may recommend a prescription medicine to help prevent HIV  infection. If you choose to take medicine to prevent HIV, you should first get tested for HIV. You should then be tested every 3 months for as long as you are taking the medicine. Pregnancy If you are about to stop having your period (premenopausal) and you may become pregnant, seek counseling before you get pregnant. Take 400 to 800 micrograms (mcg) of folic acid every day if you become pregnant. Ask for birth control (contraception) if you want to prevent pregnancy. Osteoporosis and menopause Osteoporosis is a disease in which the bones lose minerals and strength with aging. This can result in bone fractures. If you are 19 years old or older, or if you are at risk for osteoporosis and fractures, ask your health care provider if you should: Be screened for bone loss. Take a calcium or vitamin D supplement to lower your risk of fractures. Be given hormone replacement therapy (HRT) to treat symptoms of menopause. Follow these instructions at home: Lifestyle Do not use any products that contain nicotine or tobacco, such as cigarettes, e-cigarettes, and chewing tobacco. If you need help quitting, ask your health care provider. Do not use street drugs. Do not share needles. Ask your health care provider for help if you need support or information about quitting drugs. Alcohol use Do not drink alcohol if: Your health care provider tells you not to drink. You are pregnant, may be pregnant, or are planning to become pregnant. If you drink alcohol: Limit how much you use to 0-1 drink a day. Limit intake if you are breastfeeding. Be aware of how much alcohol is in your drink. In the [place] one drink equals one 12 oz bottle of beer (355 mL), one 5 oz glass of wine (148 mL), or one 1 oz glass of hard liquor (44 mL). General instructions Schedule regular health, dental, and eye exams. Stay current with your vaccines. Tell your health care provider if: You often feel depressed. You have ever been  abused or do not feel safe at home. Summary Adopting a healthy lifestyle and getting preventive care are important in promoting health and wellness. Follow your health care provider's instructions about healthy diet, exercising, and getting tested or screened for diseases. Follow your health care provider's instructions on monitoring your cholesterol and blood pressure. This information is not intended to replace advice given to you by your health care provider. Make sure you discuss any questions you have with your health care provider. Document Revised: 11/10/2020 Document Reviewed: 08/26/2018 Elsevier Patient Education  2022 [employer].

## 2021-07-02 NOTE — Progress Notes (Signed)
Subjective:    Patient ID: Dawn Townsend, female    DOB: 01-08-1966, 55 y.o.   MRN: [id number]   This visit occurred during the SARS-CoV-2 public health emergency.  Safety protocols were in place, including screening questions prior to the visit, additional usage of staff PPE, and extensive cleaning of exam room while observing appropriate contact time as indicated for disinfecting solutions.    HPI She is here for a physical exam.    She denies changes in her health.  She can eat and still be hungry 30 min later.  She is trying to increase her water intake and eat better.  She has been limiting her sugars and carbohydrates.  She is still drinking Kool-Aid with sugar.  She has been resisting them desire to eat more.    Medications and allergies reviewed with patient and updated if appropriate.  Patient Active Problem List   Diagnosis Date Noted   Hot flashes 02/27/2021   Adjustment disorder with mixed anxiety and depressed mood 12/04/2020   COVID 09/22/2020   Hair loss 09/18/2018   Bumps on skin 09/18/2018   Seborrheic dermatitis of scalp 06/10/2018   Cardiac murmur 02/27/2018   Ankle edema, bilateral 02/27/2018   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   Prediabetes 10/15/2016   Asthma 06/27/2016   GERD (gastroesophageal reflux disease) 06/27/2016   Uncontrolled hypertension 06/27/2016   Morbid obesity ([place]) 06/27/2016   Family history of diabetes mellitus (DM) 06/27/2016   Eczema 06/27/2016   Chronic lower back pain 06/27/2016   Bilateral knee pain 06/27/2016    Current Outpatient Medications on File Prior to Visit  Medication Sig Dispense Refill   albuterol (VENTOLIN HFA) 108 (90 Base) MCG/ACT inhaler INHALE 1-2 PUFFS INTO THE LUNGS EVERY 6 (SIX) HOURS AS NEEDED. 18 g 5   amoxicillin (AMOXIL) 500 MG capsule Take 1 capsule (500 mg total) by mouth 3 (three) times daily for 7 days. Take it every 8 hours with meals. Do not double dose. 21 capsule 0   Cholecalciferol (VITAMIN D PO)  Take by mouth.     diclofenac (VOLTAREN) 75 MG EC tablet Take 1 tablet (75 mg total) by mouth 2 (two) times daily. 60 tablet 2   hydrochlorothiazide (HYDRODIURIL) 25 MG tablet TAKE 1 TABLET (25 MG TOTAL) BY MOUTH DAILY. 90 tablet 3   ibuprofen (ADVIL) 600 MG tablet Take 1 tablet (600 mg total) by mouth every 6-8 hours as needed for pain 30 tablet 0   lansoprazole (PREVACID) 30 MG capsule Take 1 capsule (30 mg total) by mouth every morning before breakfast 90 capsule 1   montelukast (SINGULAIR) 10 MG tablet Take 1 tablet (10 mg total) by mouth at bedtime. 90 tablet 0   nebivolol (BYSTOLIC) 10 MG tablet Take 1 tablet (10 mg total) by mouth daily. 90 tablet 1   No current facility-administered medications on file prior to visit.    Past Medical History:  Diagnosis Date   Arthritis    knee    Asthma    no inhaler   Fibroids    GERD (gastroesophageal reflux disease)    Hyperlipidemia    Hypertension    SVD (spontaneous vaginal delivery)    x 1   Tendonitis of foot     Past Surgical History:  Procedure Laterality Date   ABDOMINAL HYSTERECTOMY Bilateral 06/08/2014   Procedure: HYSTERECTOMY ABDOMINAL/BILATERAL SALPINGECTOMY/POSSIBLE BILATERAL SALPINGO OOPHORECTOMY;  Surgeon: Daria Pastures, MD;  Location: [facility];  Service: Gynecology;  Laterality: Bilateral;  POSSIBLE STAGING  BILATERAL SALPINGECTOMY Right 06/08/2014   Procedure: BILATERAL SALPINGECTOMY;  Surgeon: Daria Pastures, MD;  Location: [facility];  Service: Gynecology;  Laterality: Right;   CHOLECYSTECTOMY  2000   OOPHORECTOMY Left 06/08/2014   Procedure: OOPHORECTOMY;  Surgeon: Daria Pastures, MD;  Location: [facility];  Service: Gynecology;  Laterality: Left;   WISDOM TOOTH EXTRACTION      Social History   Socioeconomic History   Marital status: Single    Spouse name: Not on file   Number of children: Not on file   Years of education: Not on file   Highest education level: Not on file  Occupational History   Not on  file  Tobacco Use   Smoking status: Never   Smokeless tobacco: Never  Substance and Sexual Activity   Alcohol use: No   Drug use: No   Sexual activity: Not Currently    Birth control/protection: None  Other Topics Concern   Not on file  Social History Narrative   Not on file   Social Determinants of Health   Financial Resource Strain: Not on file  Food Insecurity: Not on file  Transportation Needs: Not on file  Physical Activity: Not on file  Stress: Not on file  Social Connections: Not on file    Family History  Problem Relation Age of Onset   Arthritis Mother    Heart disease Mother    Hypertension Mother    Diabetes Mother    Arthritis Father    Heart disease Father    Hypertension Father    Diabetes Father    Arthritis Maternal Grandmother    Arthritis Maternal Grandfather    Arthritis Paternal Grandmother    Arthritis Paternal Grandfather     Review of Systems  Constitutional:  Negative for chills and fever.  Eyes:  Negative for visual disturbance.  Respiratory:  Positive for shortness of breath (related to weight). Negative for cough and wheezing.   Cardiovascular:  Positive for leg swelling. Negative for chest pain and palpitations.  Gastrointestinal:  Negative for abdominal pain, blood in stool, constipation, diarrhea and nausea.       Occ gerd  Genitourinary:  Positive for dysuria.  Musculoskeletal:  Positive for arthralgias and back pain.  Skin:  Negative for color change and rash.  Neurological:  Positive for numbness (hand - from OA). Negative for dizziness, light-headedness and headaches.  Psychiatric/Behavioral:  Negative for dysphoric mood. The patient is not nervous/anxious.       Objective:   Vitals:   07/03/21 1009  BP: (!) 142/80  Pulse: (!) 58  Temp: 98.3 F (36.8 C)  SpO2: 99%   Filed Weights   07/03/21 1009  Weight: 293 lb (132.9 kg)   Body mass index is 42.04 kg/m.  BP Readings from Last 3 Encounters:  07/03/21 (!) 142/80   02/27/21 (!) 142/92  12/07/20 (!) 142/88    Wt Readings from Last 3 Encounters:  07/03/21 293 lb (132.9 kg)  02/27/21 294 lb (133.4 kg)  12/07/20 280 lb 3.2 oz (127.1 kg)     Physical Exam Constitutional: She appears well-developed and well-nourished. No distress.  HENT:  Head: Normocephalic and atraumatic.  Right Ear: External ear normal. Normal ear canal and TM Left Ear: External ear normal.  Normal ear canal and TM Mouth/Throat: Oropharynx is clear and moist.  Eyes: Conjunctivae and EOM are normal.  Neck: Neck supple. No tracheal deviation present. No thyromegaly present.  No carotid bruit  Cardiovascular: Normal rate, regular rhythm and  normal heart sounds.   No murmur heard.  No edema. Pulmonary/Chest: Effort normal and breath sounds normal. No respiratory distress. She has no wheezes. She has no rales.  Breast: deferred   Abdominal: Soft. She exhibits no distension. There is no tenderness.  Lymphadenopathy: She has no cervical adenopathy.  Skin: Skin is warm and dry. She is not diaphoretic.  Psychiatric: She has a normal mood and affect. Her behavior is normal.     Lab Results  Component Value Date   WBC 4.9 12/04/2020   HGB 12.9 12/04/2020   HCT 39.4 12/04/2020   PLT 212.0 12/04/2020   GLUCOSE 67 (L) 12/04/2020   CHOL 199 12/04/2020   TRIG 125.0 12/04/2020   HDL 61.50 12/04/2020   LDLCALC 113 (H) 12/04/2020   ALT 17 12/04/2020   AST 15 12/04/2020   NA 141 12/04/2020   K 3.6 12/04/2020   CL 102 12/04/2020   CREATININE 0.59 12/04/2020   BUN 22 12/04/2020   CO2 31 12/04/2020   TSH 2.21 12/04/2020   HGBA1C 6.1 12/04/2020         Assessment & Plan:   Physical exam: Screening blood work  ordered Exercise  none Weight advised weight loss - working on her diet Substance abuse  none   Reviewed recommended immunizations.  Flu vaccine   Health Maintenance  Topic Date Due   COVID-19 Vaccine (1) Never done   Hepatitis C Screening  Never done    COLONOSCOPY (Pts 45-21yrs Insurance coverage will need to be confirmed)  Never done   MAMMOGRAM  Never done   Zoster Vaccines- Shingrix (1 of 2) Never done   PAP SMEAR-Modifier  09/14/2018   INFLUENZA VACCINE  04/16/2021   TETANUS/TDAP  05/20/2023   HIV Screening  Completed   HPV VACCINES  Aged Out      Will consider colonoscopy next year    See Problem List for Assessment and Plan of chronic medical problems.

## 2021-07-03 ENCOUNTER — Other Ambulatory Visit: Payer: Self-pay

## 2021-07-03 ENCOUNTER — Encounter: Payer: Self-pay | Admitting: Internal Medicine

## 2021-07-03 ENCOUNTER — Ambulatory Visit (INDEPENDENT_AMBULATORY_CARE_PROVIDER_SITE_OTHER): Payer: 59 | Admitting: Internal Medicine

## 2021-07-03 VITALS — BP 142/80 | HR 58 | Temp 98.3°F | Ht 70.0 in | Wt 293.0 lb

## 2021-07-03 DIAGNOSIS — Z0000 Encounter for general adult medical examination without abnormal findings: Secondary | ICD-10-CM | POA: Diagnosis not present

## 2021-07-03 DIAGNOSIS — Z1159 Encounter for screening for other viral diseases: Secondary | ICD-10-CM | POA: Diagnosis not present

## 2021-07-03 DIAGNOSIS — Z23 Encounter for immunization: Secondary | ICD-10-CM | POA: Diagnosis not present

## 2021-07-03 DIAGNOSIS — R7303 Prediabetes: Secondary | ICD-10-CM | POA: Diagnosis not present

## 2021-07-03 DIAGNOSIS — I10 Essential (primary) hypertension: Secondary | ICD-10-CM

## 2021-07-03 DIAGNOSIS — J45909 Unspecified asthma, uncomplicated: Secondary | ICD-10-CM | POA: Diagnosis not present

## 2021-07-03 DIAGNOSIS — E041 Nontoxic single thyroid nodule: Secondary | ICD-10-CM | POA: Diagnosis not present

## 2021-07-03 DIAGNOSIS — K219 Gastro-esophageal reflux disease without esophagitis: Secondary | ICD-10-CM

## 2021-07-03 LAB — COMPREHENSIVE METABOLIC PANEL
ALT: 19 U/L (ref 0–35)
AST: 15 U/L (ref 0–37)
Albumin: 4.1 g/dL (ref 3.5–5.2)
Alkaline Phosphatase: 90 U/L (ref 39–117)
BUN: 17 mg/dL (ref 6–23)
CO2: 31 mEq/L (ref 19–32)
Calcium: 9.7 mg/dL (ref 8.4–10.5)
Chloride: 98 mEq/L (ref 96–112)
Creatinine, Ser: 0.62 mg/dL (ref 0.40–1.20)
GFR: 100.84 mL/min (ref >60.00)
Glucose, Bld: 81 mg/dL (ref 70–99)
Potassium: 4.2 mEq/L (ref 3.5–5.1)
Sodium: 138 mEq/L (ref 135–145)
Total Bilirubin: 0.6 mg/dL (ref 0.2–1.2)
Total Protein: 8 g/dL (ref 6.0–8.3)

## 2021-07-03 LAB — CBC WITH DIFFERENTIAL/PLATELET
Basophils Absolute: 0 10*3/uL (ref 0.0–0.1)
Basophils Relative: 0.3 % (ref 0.0–3.0)
Eosinophils Absolute: 0.1 10*3/uL (ref 0.0–0.7)
Eosinophils Relative: 2 % (ref 0.0–5.0)
HCT: 42.9 % (ref 36.0–46.0)
Hemoglobin: 13.6 g/dL (ref 12.0–15.0)
Lymphocytes Relative: 30.7 % (ref 12.0–46.0)
Lymphs Abs: 1.3 10*3/uL (ref 0.7–4.0)
MCHC: 31.6 g/dL (ref 30.0–36.0)
MCV: 83.5 fl (ref 78.0–100.0)
Monocytes Absolute: 0.7 10*3/uL (ref 0.1–1.0)
Monocytes Relative: 16.3 % — ABNORMAL HIGH (ref 3.0–12.0)
Neutro Abs: 2.1 10*3/uL (ref 1.4–7.7)
Neutrophils Relative %: 50.7 % (ref 43.0–77.0)
Platelets: 244 10*3/uL (ref 150.0–400.0)
RBC: 5.14 Mil/uL — ABNORMAL HIGH (ref 3.87–5.11)
RDW: 15.4 % (ref 11.5–15.5)
WBC: 4.1 10*3/uL (ref 4.0–10.5)

## 2021-07-03 LAB — LIPID PANEL
Cholesterol: 203 mg/dL — ABNORMAL HIGH (ref 0–200)
HDL: 67.9 mg/dL (ref >39.00)
LDL Cholesterol: 118 mg/dL — ABNORMAL HIGH (ref 0–99)
NonHDL: 134.81
Total CHOL/HDL Ratio: 3
Triglycerides: 82 mg/dL (ref 0.0–149.0)
VLDL: 16.4 mg/dL (ref 0.0–40.0)

## 2021-07-03 LAB — HEMOGLOBIN A1C: Hgb A1c MFr Bld: 6 % (ref 4.6–6.5)

## 2021-07-03 LAB — TSH: TSH: 1.64 u[IU]/mL (ref 0.35–5.50)

## 2021-07-03 NOTE — Assessment & Plan Note (Signed)
Chronic She is working hard on changing her eating habits, eating less, drinking more water and knows she needs to lose weight She has lost some weight Discussed that we could consider adding metformin since she is a prediabetic to help with insulin resistance and possibly weight loss-she will consider this Encouraged remain as active as possible Gust decrease portions, low sugar/carbohydrate diet

## 2021-07-03 NOTE — Assessment & Plan Note (Signed)
Chronic Check a1c Low sugar / carb diet Stressed regular exercise Continue working on weight loss Consider starting metformin to help with sugars and weight loss

## 2021-07-03 NOTE — Assessment & Plan Note (Signed)
Chronic Blood pressure adequately controlled here today, but need to be monitored to make sure it is well controlled Continue hydrochlorothiazide 25 mg daily and Bystolic 10 mg daily Continue weight loss efforts CMP

## 2021-07-03 NOTE — Assessment & Plan Note (Signed)
New Thyroid nodule on exam right lower thyroid lobe TSH Ultrasound of thyroid ordered

## 2021-07-03 NOTE — Assessment & Plan Note (Signed)
Chronic Mild, intermittent Controlled Rarely uses albuterol inhaler-continue albuterol inhaler as needed

## 2021-07-03 NOTE — Assessment & Plan Note (Signed)
Chronic GERD controlled Continue Prevacid 30 mg daily

## 2021-07-04 ENCOUNTER — Encounter: Payer: 59 | Admitting: Internal Medicine

## 2021-07-04 LAB — HEPATITIS C ANTIBODY
Hepatitis C Ab: NONREACTIVE
SIGNAL TO CUT-OFF: 0.01 (ref <1.00)

## 2021-07-06 ENCOUNTER — Telehealth: Payer: Self-pay | Admitting: Internal Medicine

## 2021-07-06 NOTE — Telephone Encounter (Signed)
Message left for patient today.  We do not have yellow form to give her.   She will need to get form and bring it to [place] if she needs it specifically completed.

## 2021-07-06 NOTE — Telephone Encounter (Signed)
Patient is a [employer] employee  Was given Flu vaccine at recent 10/18 OV  Says her supervisor would not accept letter given from nurse.. says she needs some type of "yellow form" completed  Please call patient [phone]

## 2021-07-20 ENCOUNTER — Other Ambulatory Visit: Payer: Self-pay | Admitting: Internal Medicine

## 2021-07-20 ENCOUNTER — Other Ambulatory Visit (HOSPITAL_COMMUNITY): Payer: Self-pay

## 2021-07-20 MED ORDER — MONTELUKAST SODIUM 10 MG PO TABS
10.0000 mg | ORAL_TABLET | Freq: Every day | ORAL | 0 refills | Status: DC
  Filled 2021-07-20: qty 90, 90d supply, fill #0

## 2021-07-21 MED ORDER — DICLOFENAC SODIUM 75 MG PO TBEC
75.0000 mg | DELAYED_RELEASE_TABLET | Freq: Two times a day (BID) | ORAL | 1 refills | Status: DC
  Filled 2021-07-21: qty 60, 30d supply, fill #0
  Filled 2021-08-16: qty 60, 30d supply, fill #1

## 2021-07-23 ENCOUNTER — Other Ambulatory Visit (HOSPITAL_COMMUNITY): Payer: Self-pay

## 2021-07-26 NOTE — Progress Notes (Deleted)
Subjective:    Patient ID: Dawn Townsend, female    DOB: March 03, 1966, 55 y.o.   MRN: [id number]  This visit occurred during the SARS-CoV-2 public health emergency.  Safety protocols were in place, including screening questions prior to the visit, additional usage of staff PPE, and extensive cleaning of exam room while observing appropriate contact time as indicated for disinfecting solutions.    HPI The patient is here for an acute visit.  Pain on left side of face near nose when pressure applied - started a few days ago   Medications and allergies reviewed with patient and updated if appropriate.  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 18 g 5   amoxicillin (AMOXIL) 500 MG capsule Take 1 capsule (500 mg total) by mouth 3 (three) times daily for 7 days. Take it every 8 hours with meals. Do not double dose. 21 capsule 0   Cholecalciferol (VITAMIN D PO) Take by mouth.     diclofenac (VOLTAREN) 75 MG EC tablet Take 1 tablet (75 mg total) by mouth 2 (two) times daily. 60 tablet 1   hydrochlorothiazide (HYDRODIURIL) 25 MG  tablet TAKE 1 TABLET (25 MG TOTAL) BY MOUTH DAILY. 90 tablet 3   ibuprofen (ADVIL) 600 MG tablet Take 1 tablet (600 mg total) by mouth every 6-8 hours as needed for pain 30 tablet 0   lansoprazole (PREVACID) 30 MG capsule Take 1 capsule (30 mg total) by mouth every morning before breakfast 90 capsule 1   montelukast (SINGULAIR) 10 MG tablet Take 1 tablet (10 mg total) by mouth at bedtime. 90 tablet 0   nebivolol (BYSTOLIC) 10 MG tablet Take 1 tablet (10 mg total) by mouth daily. 90 tablet 1   No current facility-administered medications on file prior to visit.    Past Medical History:  Diagnosis Date   Arthritis    knee    Asthma    no inhaler   Fibroids    GERD (gastroesophageal reflux disease)    Hyperlipidemia    Hypertension    SVD (spontaneous vaginal delivery)    x 1   Tendonitis of foot     Past Surgical History:  Procedure Laterality Date   ABDOMINAL HYSTERECTOMY Bilateral 06/08/2014   Procedure: HYSTERECTOMY ABDOMINAL/BILATERAL SALPINGECTOMY/POSSIBLE BILATERAL SALPINGO OOPHORECTOMY;  Surgeon: Daria Pastures, MD;  Location: [facility];  Service: Gynecology;  Laterality: Bilateral;  POSSIBLE STAGING   BILATERAL SALPINGECTOMY Right 06/08/2014   Procedure: BILATERAL SALPINGECTOMY;  Surgeon: Daria Pastures, MD;  Location: [facility];  Service: Gynecology;  Laterality: Right;   CHOLECYSTECTOMY  2000   OOPHORECTOMY Left 06/08/2014  Procedure: OOPHORECTOMY;  Surgeon: Daria Pastures, MD;  Location: [facility];  Service: Gynecology;  Laterality: Left;   WISDOM TOOTH EXTRACTION      Social History   Socioeconomic History   Marital status: Single    Spouse name: Not on file   Number of children: Not on file   Years of education: Not on file   Highest education level: Not on file  Occupational History   Not on file  Tobacco Use   Smoking status: Never   Smokeless tobacco: Never  Substance and Sexual Activity   Alcohol use: No   Drug use: No   Sexual activity: Not  Currently    Birth control/protection: None  Other Topics Concern   Not on file  Social History Narrative   Not on file   Social Determinants of Health   Financial Resource Strain: Not on file  Food Insecurity: Not on file  Transportation Needs: Not on file  Physical Activity: Not on file  Stress: Not on file  Social Connections: Not on file    Family History  Problem Relation Age of Onset   Arthritis Mother    Heart disease Mother    Hypertension Mother    Diabetes Mother    Arthritis Father    Heart disease Father    Hypertension Father    Diabetes Father    Arthritis Maternal Grandmother    Arthritis Maternal Grandfather    Arthritis Paternal Grandmother    Arthritis Paternal Grandfather     Review of Systems     Objective:  There were no vitals filed for this visit. BP Readings from Last 3 Encounters:  07/03/21 (!) 142/80  02/27/21 (!) 142/92  12/07/20 (!) 142/88   Wt Readings from Last 3 Encounters:  07/03/21 293 lb (132.9 kg)  02/27/21 294 lb (133.4 kg)  12/07/20 280 lb 3.2 oz (127.1 kg)   There is no height or weight on file to calculate BMI.   Physical Exam         Assessment & Plan:    See Problem List for Assessment and Plan of chronic medical problems.

## 2021-07-27 ENCOUNTER — Ambulatory Visit: Payer: 59 | Admitting: Internal Medicine

## 2021-08-10 ENCOUNTER — Other Ambulatory Visit (HOSPITAL_COMMUNITY): Payer: Self-pay

## 2021-08-14 ENCOUNTER — Other Ambulatory Visit (HOSPITAL_COMMUNITY): Payer: Self-pay

## 2021-08-14 MED ORDER — AMOXICILLIN 500 MG PO CAPS
500.0000 mg | ORAL_CAPSULE | Freq: Three times a day (TID) | ORAL | 0 refills | Status: DC
  Filled 2021-08-14: qty 21, 7d supply, fill #0

## 2021-08-16 ENCOUNTER — Other Ambulatory Visit (HOSPITAL_COMMUNITY): Payer: Self-pay

## 2021-08-17 ENCOUNTER — Ambulatory Visit: Payer: 59 | Admitting: Internal Medicine

## 2021-09-21 ENCOUNTER — Other Ambulatory Visit: Payer: Self-pay | Admitting: Internal Medicine

## 2021-09-21 ENCOUNTER — Other Ambulatory Visit (HOSPITAL_COMMUNITY): Payer: Self-pay

## 2021-09-21 MED ORDER — HYDROCHLOROTHIAZIDE 25 MG PO TABS
25.0000 mg | ORAL_TABLET | Freq: Every day | ORAL | 3 refills | Status: DC
  Filled 2021-09-21: qty 90, 90d supply, fill #0

## 2021-09-21 MED ORDER — DICLOFENAC SODIUM 75 MG PO TBEC
75.0000 mg | DELAYED_RELEASE_TABLET | Freq: Two times a day (BID) | ORAL | 1 refills | Status: DC
  Filled 2021-09-21: qty 60, 30d supply, fill #0
  Filled 2021-10-19: qty 60, 30d supply, fill #1

## 2021-09-24 ENCOUNTER — Other Ambulatory Visit (HOSPITAL_COMMUNITY): Payer: Self-pay

## 2021-10-19 ENCOUNTER — Other Ambulatory Visit: Payer: Self-pay | Admitting: Internal Medicine

## 2021-10-19 ENCOUNTER — Other Ambulatory Visit (HOSPITAL_COMMUNITY): Payer: Self-pay

## 2021-10-19 MED ORDER — MONTELUKAST SODIUM 10 MG PO TABS
10.0000 mg | ORAL_TABLET | Freq: Every day | ORAL | 0 refills | Status: DC
  Filled 2021-10-19: qty 90, 90d supply, fill #0

## 2021-11-13 ENCOUNTER — Other Ambulatory Visit (HOSPITAL_COMMUNITY): Payer: Self-pay

## 2021-11-13 DIAGNOSIS — M170 Bilateral primary osteoarthritis of knee: Secondary | ICD-10-CM | POA: Diagnosis not present

## 2021-11-13 MED ORDER — DICLOFENAC SODIUM 75 MG PO TBEC
75.0000 mg | DELAYED_RELEASE_TABLET | Freq: Two times a day (BID) | ORAL | 1 refills | Status: DC
  Filled 2021-11-13: qty 60, 30d supply, fill #0
  Filled 2021-12-24: qty 60, 30d supply, fill #1

## 2021-11-19 ENCOUNTER — Other Ambulatory Visit (HOSPITAL_COMMUNITY): Payer: Self-pay

## 2021-11-20 ENCOUNTER — Other Ambulatory Visit: Payer: Self-pay | Admitting: Internal Medicine

## 2021-11-20 ENCOUNTER — Other Ambulatory Visit (HOSPITAL_COMMUNITY): Payer: Self-pay

## 2021-11-20 MED ORDER — LANSOPRAZOLE 30 MG PO CPDR
30.0000 mg | DELAYED_RELEASE_CAPSULE | Freq: Every morning | ORAL | 1 refills | Status: DC
  Filled 2021-11-20: qty 90, 90d supply, fill #0
  Filled 2022-02-19: qty 90, 90d supply, fill #1

## 2021-12-24 ENCOUNTER — Other Ambulatory Visit (HOSPITAL_COMMUNITY): Payer: Self-pay

## 2021-12-24 ENCOUNTER — Other Ambulatory Visit: Payer: Self-pay | Admitting: Internal Medicine

## 2021-12-24 MED ORDER — NEBIVOLOL HCL 10 MG PO TABS
10.0000 mg | ORAL_TABLET | Freq: Every day | ORAL | 0 refills | Status: DC
  Filled 2021-12-24: qty 90, 90d supply, fill #0

## 2022-01-03 ENCOUNTER — Ambulatory Visit: Payer: 59 | Admitting: Internal Medicine

## 2022-01-21 ENCOUNTER — Other Ambulatory Visit (HOSPITAL_COMMUNITY): Payer: Self-pay

## 2022-01-21 MED ORDER — DICLOFENAC SODIUM 75 MG PO TBEC
75.0000 mg | DELAYED_RELEASE_TABLET | Freq: Two times a day (BID) | ORAL | 1 refills | Status: DC
  Filled 2022-01-21: qty 60, 30d supply, fill #0

## 2022-02-08 ENCOUNTER — Other Ambulatory Visit (HOSPITAL_COMMUNITY): Payer: Self-pay

## 2022-02-08 ENCOUNTER — Ambulatory Visit: Payer: 59 | Admitting: Family Medicine

## 2022-02-08 ENCOUNTER — Encounter: Payer: Self-pay | Admitting: Family Medicine

## 2022-02-08 ENCOUNTER — Ambulatory Visit (INDEPENDENT_AMBULATORY_CARE_PROVIDER_SITE_OTHER): Payer: 59

## 2022-02-08 VITALS — BP 120/80 | HR 76 | Ht 70.0 in | Wt 298.6 lb

## 2022-02-08 DIAGNOSIS — M545 Low back pain, unspecified: Secondary | ICD-10-CM | POA: Diagnosis not present

## 2022-02-08 DIAGNOSIS — G8929 Other chronic pain: Secondary | ICD-10-CM | POA: Diagnosis not present

## 2022-02-08 DIAGNOSIS — M5136 Other intervertebral disc degeneration, lumbar region: Secondary | ICD-10-CM | POA: Diagnosis not present

## 2022-02-08 MED ORDER — TIZANIDINE HCL 4 MG PO TABS
4.0000 mg | ORAL_TABLET | Freq: Three times a day (TID) | ORAL | 1 refills | Status: DC | PRN
  Filled 2022-02-08: qty 30, 10d supply, fill #0
  Filled 2022-03-15: qty 30, 10d supply, fill #1

## 2022-02-08 MED ORDER — HYDROCODONE-ACETAMINOPHEN 5-325 MG PO TABS
1.0000 | ORAL_TABLET | Freq: Four times a day (QID) | ORAL | 0 refills | Status: DC | PRN
  Filled 2022-02-08: qty 15, 4d supply, fill #0

## 2022-02-08 NOTE — Progress Notes (Signed)
I, Wendy Poet, LAT, ATC, am serving as scribe for Dr. Lynne Leader.  Dawn Townsend is a 56 y.o. female who presents to [facility] today for exacerbation of chronic low back pain.  Patient was last seen for this issue in March 2022.  During that visit she was referred to physical therapy where she completed 3 visits by Feb 13, 2021 where at that time she was transitioned to home exercise program.  Today, pt reports that her lower back pain has been flared up "for a minute."  She notes excessive pain and pressure in her lower back.  Standing longer than 15-20 min increases pain.  She denies any radiating pain or paresthesias.  She has not been doing anything for her pain.  Dx imaging: 04/26/16 L-spine MRI 03/03/16 L-spine XR 10/28/14 L-spine XR   Pertinent review of systems: No fevers or chills  Relevant historical information: Difficult to control hypertension.   Exam:  BP 120/80 (BP Location: Left Arm, Patient Position: Sitting, Cuff Size: Large)   Pulse 76   Ht '5\' 10"'$  (1.778 m)   Wt 298 lb 9.6 oz (135.4 kg)   LMP 06/02/2014   SpO2 97%   BMI 42.84 kg/m  General: Well Developed, well nourished, and in no acute distress.   MSK: L-spine: Normal. Nontender midline. Decreased lumbar motion. Lower extremity strength is intact. Antalgic gait present.    Lab and Radiology Results  EXAM: MRI LUMBAR SPINE WITHOUT CONTRAST   TECHNIQUE: Multiplanar, multisequence MR imaging of the lumbar spine was performed. No intravenous contrast was administered.   COMPARISON:  Radiographs dated 03/03/2016   FINDINGS: Segmentation: Prominent left transverse process of L5 articulates with the sacrum. Otherwise normal.   Alignment:  Normal.   Vertebrae: Decreased signal intensity from the marrow throughout the spine consistent with prominent red marrow. Is the patient anemic? Patient is not a smoker.   Conus medullaris: Extends to the L1-2 level and appears  normal.   Paraspinal and other soft tissues: Normal.   Disc levels:   T12-L1:  Normal.   L1-2: Slight disc desiccation. Minimal disc bulge into the left neural foramen without neural impingement. Minimal facet arthritis.   L2-3: Small broad-based disc bulge with small protrusions into both neural foramina without neural impingement. Moderate bilateral facet arthritis.   L3-4: Disc space narrowing and disc desiccation with a small broad-based disc bulge with a protrusion in and lateral to the right neural foramen with slight deviation of the right L3 nerve lateral to the neural foramen. Minimal degenerative changes of the right facet joint.   L4-5: Disc degeneration and disc space narrowing with degenerative changes of the vertebral endplates. Small disc protrusions and osteophytes extend into both neural foramina without neural impingement. Slight degenerative changes of the right facet joint.   L5-S1: Normal disc. Slight degenerative changes at the articulation of the left transverse process of L5 with the sacrum.   IMPRESSION: Multilevel degenerative disc and joint disease. Slight mass effect upon the right L3 nerve lateral to the right neural foramen at L3-4. Facet arthritis at multiple levels, most prominent at L2-3.     Electronically Signed   By: Lorriane Shire M.D.   On: 04/26/2016 09:49 I, Lynne Leader, personally (independently) visualized and performed the interpretation of the images attached in this note.   X-ray images L-spine obtained today personally and independently interpreted Diffuse lumbar degenerative changes without acute fracture present. Await formal radiology review   Assessment and  Plan: 56 y.o. female with acute exacerbation of chronic low back pain.  Acute exacerbation over the last week is thought to be muscle spasm and dysfunction.  Chronic back pain thought to be more facet related.  She is a good candidate for retrial of physical therapy.   If this is unsuccessful neck step should be repeat MRI for facet injection planning. Her pain is more severe today so we will use limited tizanidine and limited hydrocodone.  Plan on recheck in 1 month.  PDMP reviewed during this encounter. Orders Placed This Encounter  Procedures   DG Lumbar Spine Complete    Standing Status:   Future    Number of Occurrences:   1    Standing Expiration Date:   03/11/2022    Order Specific Question:   Reason for Exam (SYMPTOM  OR DIAGNOSIS REQUIRED)    Answer:   low back pain    Order Specific Question:   Is patient pregnant?    Answer:   No    Order Specific Question:   Preferred imaging location?    Answer:   [facility]   Ambulatory referral to Physical Therapy    Referral Priority:   Routine    Referral Type:   Physical Medicine    Referral Reason:   Specialty Services Required    Requested Specialty:   Physical Therapy    Number of Visits Requested:   1   Meds ordered this encounter  Medications   HYDROcodone-acetaminophen (NORCO/VICODIN) 5-325 MG tablet    Sig: Take 1 tablet by mouth every 6 (six) hours as needed.    Dispense:  15 tablet    Refill:  0   tiZANidine (ZANAFLEX) 4 MG tablet    Sig: Take 1 tablet (4 mg total) by mouth every 8 (eight) hours as needed for muscle spasms.    Dispense:  30 tablet    Refill:  1     Discussed warning signs or symptoms. Please see discharge instructions. Patient expresses understanding.   The above documentation has been reviewed and is accurate and complete Lynne Leader, M.D.

## 2022-02-08 NOTE — Patient Instructions (Addendum)
Good to see you today.  I've prescribed Tizanidine and hydrocodone.  I've referred you to PT at [facility].  Their office will call you to schedule but please let us know if you don't hear from them in one week regarding scheduling.  Please get an Xray today before you leave.  Follow-up: one month

## 2022-02-10 NOTE — Progress Notes (Signed)
      Subjective:    Patient ID: Dawn Townsend, female    DOB: 1965/12/22, 56 y.o.   MRN: [id number]     HPI Dawn Townsend is here for follow up of her chronic medical problems, including htn, prediabetes, gerd, asthma    Medications and allergies reviewed with patient and updated if appropriate.  Current Outpatient Medications on File Prior to Visit  Medication Sig Dispense Refill  . albuterol (VENTOLIN HFA) 108 (90 Base) MCG/ACT inhaler INHALE 1-2 PUFFS INTO THE LUNGS EVERY 6 (SIX) HOURS AS NEEDED. 18 g 5  . Cholecalciferol (VITAMIN D PO) Take by mouth.     No current facility-administered medications on file prior to visit.     Review of Systems     Objective:  There were no vitals filed for this visit. BP Readings from Last 3 Encounters:  07/12/22 130/86  05/15/22 129/81  05/07/22 136/82   Wt Readings from Last 3 Encounters:  05/15/22 296 lb (134.3 kg)  05/07/22 298 lb (135.2 kg)  02/22/22 (!) 301 lb (136.5 kg)   There is no height or weight on file to calculate BMI.    Physical Exam     Lab Results  Component Value Date   WBC 4.2 05/15/2022   HGB 13.9 05/15/2022   HCT 43.2 05/15/2022   PLT 209 05/15/2022   GLUCOSE 83 05/15/2022   CHOL 214 (H) 05/07/2022   TRIG 98.0 05/07/2022   HDL 58.10 05/07/2022   LDLCALC 136 (H) 05/07/2022   ALT 16 05/07/2022   AST 16 05/07/2022   NA 139 05/15/2022   K 3.8 05/15/2022   CL 104 05/15/2022   CREATININE 0.71 05/15/2022   BUN 18 05/15/2022   CO2 26 05/15/2022   TSH 2.25 05/07/2022   HGBA1C 6.3 05/07/2022     Assessment & Plan:    See Problem List for Assessment and Plan of chronic medical problems.    This encounter was created in error - please disregard.

## 2022-02-10 NOTE — Patient Instructions (Addendum)
     Blood work was ordered.     Medications changes include :      Your prescription(s) have been sent to your pharmacy.    A referral was ordered for XX.     Someone from that office will call you to schedule an appointment.    Return in about 6 months (around 08/15/2022) for Physical Exam.

## 2022-02-12 ENCOUNTER — Ambulatory Visit: Payer: 59 | Admitting: Internal Medicine

## 2022-02-12 ENCOUNTER — Encounter: Payer: Self-pay | Admitting: Internal Medicine

## 2022-02-12 ENCOUNTER — Encounter: Payer: 59 | Admitting: Internal Medicine

## 2022-02-12 DIAGNOSIS — K219 Gastro-esophageal reflux disease without esophagitis: Secondary | ICD-10-CM

## 2022-02-12 DIAGNOSIS — J45909 Unspecified asthma, uncomplicated: Secondary | ICD-10-CM

## 2022-02-12 DIAGNOSIS — R7303 Prediabetes: Secondary | ICD-10-CM

## 2022-02-12 DIAGNOSIS — I10 Essential (primary) hypertension: Secondary | ICD-10-CM

## 2022-02-12 NOTE — Progress Notes (Signed)
X-ray lumbar spine shows stable appearing arthritis changes

## 2022-02-12 NOTE — Assessment & Plan Note (Signed)
Chronic Blood pressure well controlled CMP Continue HCTZ 25 mg daily, Bystolic 10 mg daily

## 2022-02-12 NOTE — Assessment & Plan Note (Signed)
Chronic GERD controlled Continue lansoprazole 30 mg daily 

## 2022-02-12 NOTE — Assessment & Plan Note (Signed)
Chronic Check a1c Low sugar / carb diet Stressed regular exercise  

## 2022-02-12 NOTE — Assessment & Plan Note (Signed)
Chronic Mild, intermittent Controlled Continue albuterol inhaler as needed, which she uses infrequently

## 2022-02-18 ENCOUNTER — Telehealth: Payer: Self-pay | Admitting: Family Medicine

## 2022-02-18 NOTE — Telephone Encounter (Signed)
We rx'd Vicodin and Zanaflex. Pt is only able to take these meds at night as she is concerned about becoming addicted and they make her tired.  She is in pain at work during the day. Is reaching out to schedule PT. Do we have any other recommendations for daytime pain relief?

## 2022-02-19 ENCOUNTER — Other Ambulatory Visit: Payer: Self-pay | Admitting: Internal Medicine

## 2022-02-19 ENCOUNTER — Other Ambulatory Visit (HOSPITAL_COMMUNITY): Payer: Self-pay

## 2022-02-19 MED ORDER — MONTELUKAST SODIUM 10 MG PO TABS
10.0000 mg | ORAL_TABLET | Freq: Every day | ORAL | 0 refills | Status: DC
  Filled 2022-02-19: qty 90, 90d supply, fill #0

## 2022-02-19 NOTE — Telephone Encounter (Signed)
Tylenol and ibuprofen during the day will not make you sleepy but will help some.  Ibuprofen will increase your blood pressure.

## 2022-02-19 NOTE — Telephone Encounter (Signed)
Called and left detailed message for pt (ok per DPR)  w/ Dr. Clovis Riley advice.

## 2022-02-20 ENCOUNTER — Other Ambulatory Visit (HOSPITAL_COMMUNITY): Payer: Self-pay

## 2022-02-20 MED ORDER — DICLOFENAC SODIUM 75 MG PO TBEC
75.0000 mg | DELAYED_RELEASE_TABLET | Freq: Two times a day (BID) | ORAL | 1 refills | Status: DC
  Filled 2022-02-20: qty 60, 30d supply, fill #0
  Filled 2022-03-22: qty 60, 30d supply, fill #1

## 2022-02-21 ENCOUNTER — Encounter: Payer: Self-pay | Admitting: Internal Medicine

## 2022-02-21 NOTE — Patient Instructions (Addendum)
    Medications changes include :   stop hydrochlorothiazide.  Start diovan-hct 160-12.5 mg daily.  Start metformin 500 mg daily with meal.    Your prescription(s) have been sent to your pharmacy.    Return in about 4 weeks (around 03/22/2022) for Physical Exam.

## 2022-02-21 NOTE — Progress Notes (Signed)
Subjective:    Patient ID: Dawn Townsend, female    DOB: [birth year], 56 y.o.   MRN: [id number]     HPI Dawn Townsend is here for follow up of her chronic medical problems, including htn, prediabetes, gerd, asthma  Hot flashes and frequent urination - cause lack of sleep.  Always tired.  In bed about 9pm- 3-4 am - wakes up several times a night and sometimes has difficulty going to sleep.  Sore all over.  Everything hurts.  She is frustrated by her weight-she keeps gaining weight and needs help to lose weight.  She is so tired and dizzy that she is not able to cook and is often getting food out.  Medications and allergies reviewed with patient and updated if appropriate.  Current Outpatient Medications on File Prior to Visit  Medication Sig Dispense Refill   Cholecalciferol (VITAMIN D PO) Take by mouth.     diclofenac (VOLTAREN) 75 MG EC tablet Take 1 tablet (75 mg total) by mouth 2 (two) times daily. 60 tablet 1   hydrochlorothiazide (HYDRODIURIL) 25 MG tablet Take 1 tablet (25 mg total) by mouth daily. 90 tablet 3   HYDROcodone-acetaminophen (NORCO/VICODIN) 5-325 MG tablet Take 1 tablet by mouth every 6 (six) hours as needed. 15 tablet 0   lansoprazole (PREVACID) 30 MG capsule Take 1 capsule (30 mg total) by mouth every morning before breakfast 90 capsule 1   montelukast (SINGULAIR) 10 MG tablet Take 1 tablet (10 mg total) by mouth at bedtime. 90 tablet 0   nebivolol (BYSTOLIC) 10 MG tablet Take 1 tablet (10 mg total) by mouth daily. 90 tablet 0   tiZANidine (ZANAFLEX) 4 MG tablet Take 1 tablet (4 mg total) by mouth every 8 (eight) hours as needed for muscle spasms. 30 tablet 1   albuterol (VENTOLIN HFA) 108 (90 Base) MCG/ACT inhaler INHALE 1-2 PUFFS INTO THE LUNGS EVERY 6 (SIX) HOURS AS NEEDED. 18 g 5   No current facility-administered medications on file prior to visit.     Review of Systems  Constitutional:  Negative for fever.  Respiratory:  Positive for shortness of breath  (from weight). Negative for cough and wheezing.   Cardiovascular:  Positive for leg swelling. Negative for chest pain and palpitations.  Neurological:  Positive for light-headedness (occ). Negative for headaches.       Objective:   Vitals:   02/22/22 1021  BP: 138/82  Pulse: (!) 58  Temp: 98.4 F (36.9 C)  SpO2: 99%   BP Readings from Last 3 Encounters:  02/22/22 138/82  02/08/22 120/80  07/03/21 (!) 142/80   Wt Readings from Last 3 Encounters:  02/22/22 (!) 301 lb (136.5 kg)  02/08/22 298 lb 9.6 oz (135.4 kg)  07/03/21 293 lb (132.9 kg)   Body mass index is 43.19 kg/m.    Physical Exam Constitutional:      General: She is not in acute distress.    Appearance: Normal appearance.  HENT:     Head: Normocephalic and atraumatic.  Eyes:     Conjunctiva/sclera: Conjunctivae normal.  Cardiovascular:     Rate and Rhythm: Normal rate and regular rhythm.     Heart sounds: Normal heart sounds. No murmur heard. Pulmonary:     Effort: Pulmonary effort is normal. No respiratory distress.     Breath sounds: Normal breath sounds. No wheezing.  Musculoskeletal:     Cervical back: Neck supple.     Right lower leg: Edema present.  Left lower leg: Edema present.  Lymphadenopathy:     Cervical: No cervical adenopathy.  Skin:    General: Skin is warm and dry.     Findings: No rash.  Neurological:     Mental Status: She is alert. Mental status is at baseline.  Psychiatric:        Mood and Affect: Mood normal.        Behavior: Behavior normal.        Lab Results  Component Value Date   WBC 4.1 07/03/2021   HGB 13.6 07/03/2021   HCT 42.9 07/03/2021   PLT 244.0 07/03/2021   GLUCOSE 81 07/03/2021   CHOL 203 (H) 07/03/2021   TRIG 82.0 07/03/2021   HDL 67.90 07/03/2021   LDLCALC 118 (H) 07/03/2021   ALT 19 07/03/2021   AST 15 07/03/2021   NA 138 07/03/2021   K 4.2 07/03/2021   CL 98 07/03/2021   CREATININE 0.62 07/03/2021   BUN 17 07/03/2021   CO2 31 07/03/2021    TSH 1.64 07/03/2021   HGBA1C 6.0 07/03/2021     Assessment & Plan:    See Problem List for Assessment and Plan of chronic medical problems.   She is

## 2022-02-22 ENCOUNTER — Other Ambulatory Visit (HOSPITAL_COMMUNITY): Payer: Self-pay

## 2022-02-22 ENCOUNTER — Ambulatory Visit: Payer: 59 | Admitting: Internal Medicine

## 2022-02-22 VITALS — BP 138/82 | HR 58 | Temp 98.4°F | Ht 70.0 in | Wt 301.0 lb

## 2022-02-22 DIAGNOSIS — I10 Essential (primary) hypertension: Secondary | ICD-10-CM

## 2022-02-22 DIAGNOSIS — R7303 Prediabetes: Secondary | ICD-10-CM

## 2022-02-22 MED ORDER — VALSARTAN-HYDROCHLOROTHIAZIDE 160-12.5 MG PO TABS
1.0000 | ORAL_TABLET | Freq: Every day | ORAL | 3 refills | Status: DC
  Filled 2022-02-22 – 2022-03-15 (×2): qty 90, 90d supply, fill #0
  Filled 2022-08-22: qty 90, 90d supply, fill #1
  Filled 2022-11-15: qty 90, 90d supply, fill #2

## 2022-02-22 MED ORDER — METFORMIN HCL ER 500 MG PO TB24
500.0000 mg | ORAL_TABLET | Freq: Every day | ORAL | 5 refills | Status: DC
  Filled 2022-02-22 – 2022-03-15 (×2): qty 30, 30d supply, fill #0

## 2022-02-22 NOTE — Assessment & Plan Note (Signed)
Chronic Pressure better controlled today, but she is not tolerating the hydrochlorothiazide 25 mg daily well-is causing too much urination because she has to take it in the evening because of her work schedule Decrease hydrochlorothiazide to 12.5 mg daily Start valsartan 160 mg daily and combined pill with above Continue Bystolic 10 mg daily Follow-up in 1 month

## 2022-02-22 NOTE — Assessment & Plan Note (Addendum)
Chronic Check a1c at her next visit next month Low sugar / carb diet Stressed regular exercise Start metformin XR 500 mg daily-hopefully this will help to keep her sugars controlled and may help some with weight loss

## 2022-02-22 NOTE — Assessment & Plan Note (Signed)
Chronic She is not eating well and not able to exercise Stressed that in order to lose weight she needs to change her eating habits and consume less calories She is not interested in any injection, especially when she has to be daily-S[place] Will start metformin XR 500 mg daily Stressed lean proteins and vegetables Discussed that if she is eating out she needs to make some changes because she is consuming too many calories and no medication will undo that

## 2022-03-07 ENCOUNTER — Other Ambulatory Visit (HOSPITAL_COMMUNITY): Payer: Self-pay

## 2022-03-07 NOTE — Therapy (Incomplete)
OUTPATIENT PHYSICAL THERAPY THORACOLUMBAR EVALUATION   Patient Name: Dawn Townsend MRN: [id number] DOB:1966-07-30, 56 y.o., female Today's Date: 03/07/2022    Past Medical History:  Diagnosis Date   Arthritis    knee    Asthma    no inhaler   Fibroids    GERD (gastroesophageal reflux disease)    Hyperlipidemia    Hypertension    SVD (spontaneous vaginal delivery)    x 1   Tendonitis of foot    Past Surgical History:  Procedure Laterality Date   ABDOMINAL HYSTERECTOMY Bilateral 06/08/2014   Procedure: HYSTERECTOMY ABDOMINAL/BILATERAL SALPINGECTOMY/POSSIBLE BILATERAL SALPINGO OOPHORECTOMY;  Surgeon: Daria Pastures, MD;  Location: [facility];  Service: Gynecology;  Laterality: Bilateral;  POSSIBLE STAGING   BILATERAL SALPINGECTOMY Right 06/08/2014   Procedure: BILATERAL SALPINGECTOMY;  Surgeon: Daria Pastures, MD;  Location: [facility];  Service: Gynecology;  Laterality: Right;   CHOLECYSTECTOMY  2000   OOPHORECTOMY Left 06/08/2014   Procedure: OOPHORECTOMY;  Surgeon: Daria Pastures, MD;  Location: [facility];  Service: Gynecology;  Laterality: Left;   WISDOM TOOTH EXTRACTION     Patient Active Problem List   Diagnosis Date Noted   Thyroid nodule 07/03/2021   Hot flashes 02/27/2021   Adjustment disorder with mixed anxiety and depressed mood 12/04/2020   COVID 09/22/2020   Hair loss 09/18/2018   Bumps on skin 09/18/2018   Seborrheic dermatitis of scalp 06/10/2018   Cardiac murmur 02/27/2018   Ankle edema, bilateral 02/27/2018   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   Prediabetes 10/15/2016   Asthma 06/27/2016   GERD (gastroesophageal reflux disease) 06/27/2016   Uncontrolled hypertension 06/27/2016   Morbid obesity ([place]) 06/27/2016   Family history of diabetes mellitus (DM) 06/27/2016   Eczema 06/27/2016   Chronic lower back pain 06/27/2016   Bilateral knee pain 06/27/2016    PCP: Binnie Rail, MD  REFERRING PROVIDER: Gregor Hams, MD  REFERRING DIAG: M54.50 (ICD-10-CM)  - Lumbar pain M54.50,G89.29 (ICD-10-CM) - Chronic bilateral low back pain without sciatica  Rationale for Evaluation and Treatment Rehabilitation  THERAPY DIAG:  No diagnosis found.  ONSET DATE: Chronic back pain since 2015  SUBJECTIVE:   Chronic low back pain since 2015.                   PERTINENT HISTORY:  ERX:[id number] back pain,OA,asthma, HTN,  PAIN:  Are you having pain? Yes: NPRS scale: ***/10 Pain location: low back Pain description: *** Aggravating factors: *** Relieving factors: ***   PRECAUTIONS: None  WEIGHT BEARING RESTRICTIONS No  FALLS:  Has patient fallen in last 6 months? No  OCCUPATION: Pt works in Copy at [employer].   PLOF: Independent  PATIENT GOALS reduce pain   OBJECTIVE:   DIAGNOSTIC FINDINGS:  MRI IMPRESSION:"Multilevel degenerative disc and joint disease. Slight mass effect upon the right L3 nerve  lateral to the right neural foramen at L3-4.Facet arthritis at multiple levels, most prominent at L2-3."   PATIENT SURVEYS:  FOTO ***  SCREENING FOR RED FLAGS: Bowel or bladder incontinence: {Yes/No:304960894} Spinal tumors: {Yes/No:304960894} Cauda equina syndrome: {Yes/No:304960894} Compression fracture: {Yes/No:304960894} Abdominal aneurysm: {Yes/No:304960894}  COGNITION:  Overall cognitive status: Within functional limits for tasks assessed     SENSATION: {sensation:27233}  MUSCLE LENGTH: Hamstrings: Right *** deg; Left *** deg Marcello Moores test: Right *** deg; Left *** deg  POSTURE: {posture:25561}  PALPATION: ***  LUMBAR ROM:   Active    eval  Flexion   Extension   Right lateral flexion   Left lateral  flexion   Right rotation   Left rotation    (Blank rows = not tested)  LOWER EXTREMITY ROM:     AROM/PROM Right eval Left eval  Hip flexion    Hip extension    Hip abduction    Hip adduction    Hip internal rotation    Hip external rotation    Knee flexion    Knee extension    Ankle dorsiflexion    Ankle  plantarflexion    Ankle inversion    Ankle eversion     (Blank rows = not tested)  LOWER EXTREMITY MMT:    MMT Right eval Left eval  Hip flexion    Hip extension    Hip abduction    Hip adduction    Hip internal rotation    Hip external rotation    Knee flexion    Knee extension    Ankle dorsiflexion    Ankle plantarflexion    Ankle inversion    Ankle eversion     (Blank rows = not tested)  LUMBAR SPECIAL TESTS:  {lumbar special test:25242}  FUNCTIONAL TESTS:  {Functional tests:24029}  GAIT: Distance walked: *** Assistive device utilized: {Assistive devices:23999} Level of assistance: {Levels of assistance:24026} Comments: ***    TODAY'S TREATMENT  ***   PATIENT EDUCATION:  Education details: *** Person educated: {Person educated:25204} Education method: {Education Method:25205} Education comprehension: {Education Comprehension:25206}   HOME EXERCISE PROGRAM: ***   ASSESSMENT:  CLINICAL IMPRESSION: Patient presents with signs and symptoms consistent with chronic low back pain. Patient will benefit from skilled PT to address below impairments, limitations and improve overall function.  OBJECTIVE IMPAIRMENTS: decreased activity tolerance, difficulty walking, decreased balance, decreased endurance, decreased mobility, decreased ROM, decreased strength, impaired flexibility, impaired UE/LE use, postural dysfunction, and pain.  ACTIVITY LIMITATIONS: bending, lifting, carry, locomotion, cleaning, community activity, driving, and or occupation  PERSONAL FACTORS: *** are also affecting patient's functional outcome.  REHAB POTENTIAL: {rehabpotential:25112}  CLINICAL DECISION MAKING: {clinical decision making:25114}  EVALUATION COMPLEXITY: {Evaluation complexity:25115}    GOALS: Short term PT Goals Target date: {follow up:25551} Pt will be I and compliant with HEP. Baseline:  Goal status: New Pt will decrease pain by 25% overall Baseline: Goal  status: New  Long term PT goals Target date: {follow up:25551} Pt will improve ROM to [facility] to improve functional mobility Baseline: Goal status: New Pt will improve  hip/knee strength to at least 5-/5 MMT to improve functional strength Baseline: Goal status: New Pt will improve FOTO to at least % functional to show improved function Baseline: Goal status: New Pt will reduce pain by overall 50% overall with usual activity Baseline: Goal status: New Pt will reduce pain to overall less than 2-3/10 with usual activity and work activity. Baseline: Goal status: New Pt will be able to ambulate community distances at least 1000 ft WNL gait pattern without complaints Baseline: Goal status: New  PLAN: PT FREQUENCY: 1-3 times per week   PT DURATION: 6-8 weeks  PLANNED INTERVENTIONS (unless contraindicated): aquatic PT, Canalith repositioning, cryotherapy, Electrical stimulation, Iontophoresis with 4 mg/ml dexamethasome, Moist heat, traction, Ultrasound, gait training, Therapeutic exercise, balance training, neuromuscular re-education, patient/family education, prosthetic training, manual techniques, passive ROM, dry needling, taping, vasopnuematic device, vestibular, spinal manipulations, joint manipulations  PLAN FOR NEXT SESSION: ***   Debbe Odea, PT,DPT 03/07/2022, 11:10 AM

## 2022-03-08 ENCOUNTER — Ambulatory Visit: Payer: 59 | Admitting: Physical Therapy

## 2022-03-08 ENCOUNTER — Encounter: Payer: Self-pay | Admitting: Physical Therapy

## 2022-03-08 DIAGNOSIS — M6281 Muscle weakness (generalized): Secondary | ICD-10-CM

## 2022-03-08 DIAGNOSIS — M5459 Other low back pain: Secondary | ICD-10-CM

## 2022-03-12 ENCOUNTER — Ambulatory Visit: Payer: 59 | Admitting: Family Medicine

## 2022-03-15 ENCOUNTER — Other Ambulatory Visit (HOSPITAL_COMMUNITY): Payer: Self-pay

## 2022-03-22 ENCOUNTER — Other Ambulatory Visit: Payer: Self-pay | Admitting: Internal Medicine

## 2022-03-22 ENCOUNTER — Other Ambulatory Visit (HOSPITAL_COMMUNITY): Payer: Self-pay

## 2022-03-22 MED ORDER — NEBIVOLOL HCL 10 MG PO TABS
10.0000 mg | ORAL_TABLET | Freq: Every day | ORAL | 0 refills | Status: DC
  Filled 2022-03-22: qty 90, 90d supply, fill #0

## 2022-03-26 ENCOUNTER — Encounter: Payer: Self-pay | Admitting: Physical Therapy

## 2022-03-26 ENCOUNTER — Ambulatory Visit (INDEPENDENT_AMBULATORY_CARE_PROVIDER_SITE_OTHER): Payer: 59 | Admitting: Physical Therapy

## 2022-03-26 DIAGNOSIS — M545 Low back pain, unspecified: Secondary | ICD-10-CM

## 2022-03-26 DIAGNOSIS — M5459 Other low back pain: Secondary | ICD-10-CM | POA: Diagnosis not present

## 2022-03-26 DIAGNOSIS — G8929 Other chronic pain: Secondary | ICD-10-CM | POA: Diagnosis not present

## 2022-03-26 DIAGNOSIS — R2689 Other abnormalities of gait and mobility: Secondary | ICD-10-CM

## 2022-03-26 DIAGNOSIS — M6281 Muscle weakness (generalized): Secondary | ICD-10-CM | POA: Diagnosis not present

## 2022-03-26 NOTE — Therapy (Signed)
OUTPATIENT PHYSICAL THERAPY TREATMENT NOTE   Patient Name: Dawn Townsend MRN: [id number] DOB:1966-08-15, 56 y.o., female Today's Date: 03/26/2022   END OF SESSION:   PT End of Session - 03/26/22 1015     Visit Number 2    Number of Visits 12    Date for PT Re-Evaluation 05/03/22    PT Start Time 6945    PT Stop Time 1055    PT Time Calculation (min) 40 min    Activity Tolerance Patient tolerated treatment well    Behavior During Therapy WFL for tasks assessed/performed             Past Medical History:  Diagnosis Date   Arthritis    knee    Asthma    no inhaler   Fibroids    GERD (gastroesophageal reflux disease)    Hyperlipidemia    Hypertension    SVD (spontaneous vaginal delivery)    x 1   Tendonitis of foot    Past Surgical History:  Procedure Laterality Date   ABDOMINAL HYSTERECTOMY Bilateral 06/08/2014   Procedure: HYSTERECTOMY ABDOMINAL/BILATERAL SALPINGECTOMY/POSSIBLE BILATERAL SALPINGO OOPHORECTOMY;  Surgeon: Daria Pastures, MD;  Location: [facility];  Service: Gynecology;  Laterality: Bilateral;  POSSIBLE STAGING   BILATERAL SALPINGECTOMY Right 06/08/2014   Procedure: BILATERAL SALPINGECTOMY;  Surgeon: Daria Pastures, MD;  Location: [facility];  Service: Gynecology;  Laterality: Right;   CHOLECYSTECTOMY  2000   OOPHORECTOMY Left 06/08/2014   Procedure: OOPHORECTOMY;  Surgeon: Daria Pastures, MD;  Location: [facility];  Service: Gynecology;  Laterality: Left;   WISDOM TOOTH EXTRACTION     Patient Active Problem List   Diagnosis Date Noted   Thyroid nodule 07/03/2021   Hot flashes 02/27/2021   Adjustment disorder with mixed anxiety and depressed mood 12/04/2020   COVID 09/22/2020   Hair loss 09/18/2018   Bumps on skin 09/18/2018   Seborrheic dermatitis of scalp 06/10/2018   Cardiac murmur 02/27/2018   Ankle edema, bilateral 02/27/2018   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   Prediabetes 10/15/2016   Asthma 06/27/2016   GERD (gastroesophageal reflux  disease) 06/27/2016   Uncontrolled hypertension 06/27/2016   Morbid obesity ([place]) 06/27/2016   Family history of diabetes mellitus (DM) 06/27/2016   Eczema 06/27/2016   Chronic lower back pain 06/27/2016   Bilateral knee pain 06/27/2016     THERAPY DIAG:  Other low back pain  Muscle weakness (generalized)  Chronic bilateral low back pain without sciatica  Other abnormalities of gait and mobility  PCP: Binnie Rail, MD   REFERRING PROVIDER: Gregor Hams, MD   REFERRING DIAG: M54.50 (ICD-10-CM) - Lumbar pain M54.50,G89.29 (ICD-10-CM) - Chronic bilateral low back pain without sciatica   Rationale for Evaluation and Treatment Rehabilitation    ONSET DATE: Chronic back pain since 2015   SUBJECTIVE:   She says she has been having shooting pains down her legs                 PERTINENT HISTORY:  [id number] back pain,OA,asthma, HTN,   PAIN:  Are you having pain? Yes: NPRS scale: 8/10 Pain location: low back in the center and can run down her legs Pain description: heavy pressure in back and then sharp pain down leg Aggravating factors: prolonged walking or standing,  Relieving factors: sitting or bending over to rest her arms on counter, flexion     PRECAUTIONS: None   WEIGHT BEARING RESTRICTIONS No   FALLS:  Has patient fallen in last 6 months? 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 Stable moderate multilevel degenerative changes of the lumbar spine.2. No acute abnormality.   2017 MRI IMPRESSION:"Multilevel degenerative disc and joint disease. Slight mass effect upon the right L3        nerve  lateral to the right neural foramen at L3-4.Facet arthritis at multiple levels, most prominent at L2-3."     PATIENT SURVEYS:  FOTO 43% functional   SCREENING FOR RED FLAGS: Bowel or bladder incontinence: No Spinal tumors: No Cauda  equina syndrome: No Compression fracture: No   COGNITION:           Overall cognitive status: Within functional limits for tasks assessed                          SENSATION: WFL   MUSCLE LENGTH: Hamstrings: Right 60  deg; Left 55  deg   POSTURE: Rt lateral trunk shift, severe bilat knee valgus   PALPATION: TTP lumbar spine L2-3 worse    LUMBAR ROM:    Active  03/08/22  eval  Flexion WNL  Extension 25%  Right lateral flexion 75%  Left lateral flexion 25% and pain  Right rotation 50%  Left rotation 50%   (Blank rows = not tested)   LOWER EXTREMITY ROM:      AROM/PROM Right eval Left eval  Hip flexion 80 deg  PROM supine 80 deg PROM supine  Hip extension      Hip abduction      Hip adduction      Hip internal rotation      Hip external rotation      Knee flexion      Knee extension      Ankle dorsiflexion      Ankle plantarflexion      Ankle inversion      Ankle eversion       (Blank rows = not tested)   LOWER EXTREMITY MMT:     MMT in stitting Right eval Left eval  Hip flexion 3+ 3+  Hip extension      Hip abduction 3+ 3+  Hip adduction      Hip internal rotation      Hip external rotation      Knee flexion 3+ 3+  Knee extension 4 4  Ankle dorsiflexion 5 5  Ankle plantarflexion      Ankle inversion      Ankle eversion       (Blank rows = not tested)   LUMBAR SPECIAL TESTS:  Negative slump test and quadrant test bilat Negative SLR on Rt, + on left for left sided low back pain however only leg pain she typically has is on Rt leg   FUNCTIONAL TESTS:  Sit to stand: difficulty noted and must use UE to push up   GAIT: 03/08/22 Comments: limited community ambulator, slow speed, Rt trunk shift posture and severe knee valgus causing scissoring gait, very slow gait speed       TODAY'S TREATMENT   03/26/22 -Nu step L5 X 6 min UE/LE seat #12 -Standing L lumbar stretch at counter 10 sec X5 -Rt lateral shift correction 5 sec X10 -Sit to stands slow  eccentrics from slightly elevated mat 3 sets of 5 with 30 sec rest in between -Standing hip abductions 2 X 10  bilat holding 2 sec -Modified cat-cow standing leaning on counter 5 sec X10 bilat -Seated sciatic nerve glide with LAQ X 10 bilat      PATIENT EDUCATION:  Education details: HEP, PT plan of care, ice, biofreeze Person educated: Patient Education method: Explanation, Demonstration, Verbal cues, and Handouts Education comprehension: verbalized understanding and needs further education     HOME EXERCISE PROGRAM: Access Code: [email or web address] URL: [email or web address] Date: 03/26/2022 Prepared by: Elsie Ra  Exercises - Right Standing Lateral Shift Correction at Wall - Repetitions  - 2 x daily - 6 x weekly - 1-2 sets - 10 reps - 5 sec hold - Standing 'L' Stretch at Counter  - 2 x daily - 6 x weekly - 1 sets - 10 reps - 5 hold - Modified Cat Cow on Counter  - 2 x daily - 6 x weekly - 3 sets - 10 reps - 5 hold - Standing Hip Abduction with Counter Support  - 2 x daily - 6 x weekly - 1-2 sets - 10 reps - 3 sec hold - Reclined Single Knee to Chest Stretch  - 2 x daily - 6 x weekly - 1 sets - 2 reps - 30 sec hold - Sit to Stand  - 2 x daily - 6 x weekly - 2-3 sets - 5 reps -Sciatic nerve glide X10 bilat 2 6Xweekly, 2X per day     ASSESSMENT:   CLINICAL IMPRESSION: Session focused on HEP revision and review. She shows good return demonstration and understanding. She had more signs of sciatica today with complaints of shooting pain down he leg so I also added sciatic nerve stretch to see if this helps. She will continue to benefit from skilled PT to continue to address her functional impairments listed below.   OBJECTIVE IMPAIRMENTS: decreased activity tolerance, difficulty walking, decreased balance, decreased endurance, decreased mobility, decreased ROM, decreased strength, impaired flexibility, impaired UE/LE use, postural dysfunction, and pain.   ACTIVITY LIMITATIONS:  bending, lifting, carry, locomotion, cleaning, community activity, driving, and or occupation   PERSONAL FACTORS: chronic pain,OA,asthma, HTN, are also affecting patient's functional outcome.   REHAB POTENTIAL: Fair     CLINICAL DECISION MAKING: Evolving/moderate complexity   EVALUATION COMPLEXITY: Moderate       GOALS: Short term PT Goals Target date: 04/05/2022 Pt will be I and compliant with HEP. Baseline:  Goal status: ongoing Pt will decrease pain by 25% overall Baseline:9/10 Goal status: ongoing   Long term PT goals Target date: 05/03/2022 Pt will improve lumbar AROM to [facility] to improve functional mobility Baseline: Goal status: ongoing Pt will improve  hip/knee strength to at least 4+/5 MMT tested in sitting to improve functional strength Baseline: Goal status: ongoing Pt will improve FOTO to at least 54% functional to show improved function Baseline: Goal status: ongoing Pt will reduce pain by overall 50% overall with usual activity Baseline:9/10 Goal status: ongoing   PLAN: PT FREQUENCY: 1-2 times per week    PT DURATION: 6-8 weeks   PLANNED INTERVENTIONS (unless contraindicated): aquatic PT, Canalith repositioning, cryotherapy, Electrical stimulation, Iontophoresis with 4 mg/ml dexamethasome, Moist heat, traction, Ultrasound, gait training, Therapeutic exercise, balance training, neuromuscular re-education, patient/family education, prosthetic training, manual techniques, passive ROM, dry needling, taping, vasopnuematic device, vestibular, spinal manipulations, joint manipulations   PLAN FOR NEXT SESSION: how is HEP going, needs general strength and conditioning, posture corrective exercises    Debbe Odea, PT,DPT 03/26/2022, 10:16 AM

## 2022-04-05 ENCOUNTER — Encounter: Payer: 59 | Admitting: Internal Medicine

## 2022-04-05 ENCOUNTER — Encounter: Payer: 59 | Admitting: Rehabilitative and Restorative Service Providers"

## 2022-04-09 ENCOUNTER — Encounter: Payer: 59 | Admitting: Physical Therapy

## 2022-04-19 ENCOUNTER — Ambulatory Visit: Payer: 59 | Admitting: Physical Therapy

## 2022-04-19 ENCOUNTER — Encounter: Payer: Self-pay | Admitting: Physical Therapy

## 2022-04-19 ENCOUNTER — Other Ambulatory Visit (HOSPITAL_COMMUNITY): Payer: Self-pay

## 2022-04-19 DIAGNOSIS — R2689 Other abnormalities of gait and mobility: Secondary | ICD-10-CM | POA: Diagnosis not present

## 2022-04-19 DIAGNOSIS — M5459 Other low back pain: Secondary | ICD-10-CM

## 2022-04-19 DIAGNOSIS — M6281 Muscle weakness (generalized): Secondary | ICD-10-CM

## 2022-04-19 NOTE — Therapy (Addendum)
OUTPATIENT PHYSICAL THERAPY TREATMENT NOTE PHYSICAL THERAPY DISCHARGE SUMMARY  Visits from Start of Care: 3  Current functional level related to goals / functional outcomes: See below   Remaining deficits: See below   Education / Equipment: HEP  Plan:  Patient goals were not met. Patient is being discharged due to not returning since last visit and lack of progress.      Patient Name: Dawn Townsend MRN: [id number] DOB:[birth year], 56 y.o., female Today's Date: 04/19/2022   END OF SESSION:   PT End of Session - 04/19/22 1015     Visit Number 3    Number of Visits 12    Date for PT Re-Evaluation 05/03/22    PT Start Time 4163    PT Stop Time 1055    PT Time Calculation (min) 40 min    Activity Tolerance Patient tolerated treatment well    Behavior During Therapy WFL for tasks assessed/performed             Past Medical History:  Diagnosis Date   Arthritis    knee    Asthma    no inhaler   Fibroids    GERD (gastroesophageal reflux disease)    Hyperlipidemia    Hypertension    SVD (spontaneous vaginal delivery)    x 1   Tendonitis of foot    Past Surgical History:  Procedure Laterality Date   ABDOMINAL HYSTERECTOMY Bilateral 06/08/2014   Procedure: HYSTERECTOMY ABDOMINAL/BILATERAL SALPINGECTOMY/POSSIBLE BILATERAL SALPINGO OOPHORECTOMY;  Surgeon: Daria Pastures, MD;  Location: [facility];  Service: Gynecology;  Laterality: Bilateral;  POSSIBLE STAGING   BILATERAL SALPINGECTOMY Right 06/08/2014   Procedure: BILATERAL SALPINGECTOMY;  Surgeon: Daria Pastures, MD;  Location: [facility];  Service: Gynecology;  Laterality: Right;   CHOLECYSTECTOMY  2000   OOPHORECTOMY Left 06/08/2014   Procedure: OOPHORECTOMY;  Surgeon: Daria Pastures, MD;  Location: [facility];  Service: Gynecology;  Laterality: Left;   WISDOM TOOTH EXTRACTION     Patient Active Problem List   Diagnosis Date Noted   Thyroid nodule 07/03/2021   Hot flashes 02/27/2021   Adjustment disorder with  mixed anxiety and depressed mood 12/04/2020   COVID 09/22/2020   Hair loss 09/18/2018   Bumps on skin 09/18/2018   Seborrheic dermatitis of scalp 06/10/2018   Cardiac murmur 02/27/2018   Ankle edema, bilateral 02/27/2018   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   Prediabetes 10/15/2016   Asthma 06/27/2016   GERD (gastroesophageal reflux disease) 06/27/2016   Uncontrolled hypertension 06/27/2016   Morbid obesity ([place]) 06/27/2016   Family history of diabetes mellitus (DM) 06/27/2016   Eczema 06/27/2016   Chronic lower back pain 06/27/2016   Bilateral knee pain 06/27/2016     THERAPY DIAG:  Other low back pain  Muscle weakness (generalized)  Other abnormalities of gait and mobility  PCP: Binnie Rail, MD   REFERRING PROVIDER: Gregor Hams, MD   REFERRING DIAG: M54.50 (ICD-10-CM) - Lumbar pain M54.50,G89.29 (ICD-10-CM) - Chronic bilateral low back pain without sciatica   Rationale for Evaluation and Treatment Rehabilitation    ONSET DATE: Chronic back pain since 2015   SUBJECTIVE:   She says the pain has been bad 9/10 now every day. The exercises helped initially but no longer.                PERTINENT HISTORY:  AGT:XMIWOEH back pain,OA,asthma, HTN,   PAIN:  Are you having pain? Yes: NPRS scale: 9/10 Pain location: low back in the center and can run  down her legs Pain description: heavy pressure in back and then sharp pain down leg Aggravating factors: prolonged walking or standing,  Relieving factors: sitting or bending over to rest her arms on counter, flexion     PRECAUTIONS: None   WEIGHT BEARING RESTRICTIONS No   FALLS:  Has patient fallen in last 6 months? No   OCCUPATION: Pt works in Copy at [employer].    PLOF: Independent   PATIENT GOALS reduce pain     OBJECTIVE:    DIAGNOSTIC FINDINGS:  Recent XR impression 1. Stable moderate multilevel degenerative changes of the lumbar spine.2. No acute abnormality.   2017 MRI IMPRESSION:"Multilevel  degenerative disc and joint disease. Slight mass effect upon the right L3        nerve  lateral to the right neural foramen at L3-4.Facet arthritis at multiple levels, most prominent at L2-3."     PATIENT SURVEYS:  FOTO 43% functional   SCREENING FOR RED FLAGS: Bowel or bladder incontinence: No Spinal tumors: No Cauda equina syndrome: No Compression fracture: No   COGNITION:           Overall cognitive status: Within functional limits for tasks assessed                          SENSATION: WFL   MUSCLE LENGTH: Hamstrings: Right 60  deg; Left 55  deg   POSTURE: Rt lateral trunk shift, severe bilat knee valgus   PALPATION: TTP lumbar spine L2-3 worse    LUMBAR ROM:    Active  03/08/22  eval 04/19/22  Flexion WNL WNL  Extension 25%   Right lateral flexion 75%   Left lateral flexion 25% and pain   Right rotation 50%   Left rotation 50%    (Blank rows = not tested)   LOWER EXTREMITY ROM:      AROM/PROM Right eval Left eval  Hip flexion 80 deg  PROM supine 80 deg PROM supine  Hip extension      Hip abduction      Hip adduction      Hip internal rotation      Hip external rotation      Knee flexion      Knee extension      Ankle dorsiflexion      Ankle plantarflexion      Ankle inversion      Ankle eversion       (Blank rows = not tested)   LOWER EXTREMITY MMT:     MMT in stitting Right eval Left eval  Hip flexion 3+ 3+  Hip extension      Hip abduction 3+ 3+  Hip adduction      Hip internal rotation      Hip external rotation      Knee flexion 3+ 3+  Knee extension 4 4  Ankle dorsiflexion 5 5  Ankle plantarflexion      Ankle inversion      Ankle eversion       (Blank rows = not tested)   LUMBAR SPECIAL TESTS:  Negative slump test and quadrant test bilat Negative SLR on Rt, + on left for left sided low back pain however only leg pain she typically has is on Rt leg   FUNCTIONAL TESTS:  Sit to stand: difficulty noted and must use UE to push up    GAIT: 03/08/22 Comments: limited community ambulator, slow speed, Rt trunk shift posture and severe knee valgus  causing scissoring gait, very slow gait speed       TODAY'S TREATMENT  04/19/22 -Moist hot pack combined with IFC E-stim X 15 min to lumbar, intensity level to tolerance -Rt lateral shift correction 5 sec X10 -Standing lumbar extensions X10 -Standing scapular retractions 5 sec X10 -Seated slump stretch 3 sec X10 bilat -seated thoracic extension over chair 5 sec X10  03/26/22 -Nu step L5 X 6 min UE/LE seat #12 -Standing L lumbar stretch at counter 10 sec X5 -Rt lateral shift correction 5 sec X10 -Sit to stands slow eccentrics from slightly elevated mat 3 sets of 5 with 30 sec rest in between -Standing hip abductions 2 X 10 bilat holding 2 sec -Modified cat-cow standing leaning on counter 5 sec X10 bilat -Seated sciatic nerve glide with LAQ X 10 bilat      PATIENT EDUCATION:  Education details: HEP, PT plan of care, ice, biofreeze Person educated: Patient Education method: Explanation, Demonstration, Verbal cues, and Handouts Education comprehension: verbalized understanding and needs further education     HOME EXERCISE PROGRAM: Access Code: [email or web address] URL: [email or web address] Date: 04/19/2022 Prepared by: Elsie Ra  Exercises - Right Standing Lateral Shift Correction at Wall - Repetitions  - 2 x daily - 6 x weekly - 1-2 sets - 10 reps - 5 sec hold - Standing Lumbar Extension at [place]  - 2 x daily - 6 x weekly - 1-2 sets - 10 reps - 5 sec hold - Slump Stretch  - 2 x daily - 6 x weekly - 1-2 sets - 10 reps - 3 hold - Standing Scapular Retraction with External Rotation  - 2 x daily - 6 x weekly - 1-2 sets - 10 reps - Seated Thoracic Lumbar Extension  - 2 x daily - 6 x weekly - 1-2 sets - 10 reps     ASSESSMENT:   CLINICAL IMPRESSION: She has been in more pain and lumbar radiculopathy lately.  She reports the exercises are no longer  helping. I trialed E stim therapy to see if this helps reduce pain any further and I provided her with new set of exercises to try. If not relief from these then I would recommend to discontinue PT and refer back to MD.   OBJECTIVE IMPAIRMENTS: decreased activity tolerance, difficulty walking, decreased balance, decreased endurance, decreased mobility, decreased ROM, decreased strength, impaired flexibility, impaired UE/LE use, postural dysfunction, and pain.   ACTIVITY LIMITATIONS: bending, lifting, carry, locomotion, cleaning, community activity, driving, and or occupation   PERSONAL FACTORS: chronic pain,OA,asthma, HTN, are also affecting patient's functional outcome.   REHAB POTENTIAL: Fair     CLINICAL DECISION MAKING: Evolving/moderate complexity   EVALUATION COMPLEXITY: Moderate       GOALS: Short term PT Goals Target date: 04/05/2022 Pt will be I and compliant with HEP. Baseline:  Goal status: ongoing Pt will decrease pain by 25% overall Baseline:9/10 Goal status: ongoing   Long term PT goals Target date: 05/03/2022 Pt will improve lumbar AROM to [facility] to improve functional mobility Baseline: Goal status: ongoing Pt will improve  hip/knee strength to at least 4+/5 MMT tested in sitting to improve functional strength Baseline: Goal status: ongoing Pt will improve FOTO to at least 54% functional to show improved function Baseline: Goal status: ongoing Pt will reduce pain by overall 50% overall with usual activity Baseline:9/10 Goal status: ongoing   PLAN: PT FREQUENCY: 1-2 times per week    PT DURATION: 6-8 weeks   PLANNED INTERVENTIONS (unless contraindicated): aquatic  PT, Canalith repositioning, cryotherapy, Electrical stimulation, Iontophoresis with 4 mg/ml dexamethasome, Moist heat, traction, Ultrasound, gait training, Therapeutic exercise, balance training, neuromuscular re-education, patient/family education, prosthetic training, manual techniques, passive ROM, dry  needling, taping, vasopnuematic device, vestibular, spinal manipulations, joint manipulations   PLAN FOR NEXT SESSION: how are new exercises going and how was Estim? Refer back to MD if no improvement.    Debbe Odea, PT,DPT 04/19/2022, 10:18 AM

## 2022-04-22 ENCOUNTER — Other Ambulatory Visit (HOSPITAL_COMMUNITY): Payer: Self-pay

## 2022-04-23 ENCOUNTER — Other Ambulatory Visit (HOSPITAL_COMMUNITY): Payer: Self-pay

## 2022-04-23 MED ORDER — DICLOFENAC SODIUM 75 MG PO TBEC
75.0000 mg | DELAYED_RELEASE_TABLET | Freq: Two times a day (BID) | ORAL | 1 refills | Status: DC
  Filled 2022-04-23: qty 60, 30d supply, fill #0
  Filled 2022-05-21: qty 60, 30d supply, fill #1

## 2022-05-06 ENCOUNTER — Encounter: Payer: Self-pay | Admitting: Internal Medicine

## 2022-05-06 NOTE — Patient Instructions (Addendum)
Blood work was ordered.     Medications changes include :   metformin 500 mg twice a day with food   Your prescription(s) have been sent to your pharmacy.    A referral was ordered for GI - for a colonoscopy.     Someone from that office will call you to schedule an appointment.    Return in about 6 months (around 11/07/2022) for follow up.    Health Maintenance, Female Adopting a healthy lifestyle and getting preventive care are important in promoting health and wellness. Ask your health care provider about: The right schedule for you to have regular tests and exams. Things you can do on your own to prevent diseases and keep yourself healthy. What should I know about diet, weight, and exercise? Eat a healthy diet  Eat a diet that includes plenty of vegetables, fruits, low-fat dairy products, and lean protein. Do not eat a lot of foods that are high in solid fats, added sugars, or sodium. Maintain a healthy weight Body mass index (BMI) is used to identify weight problems. It estimates body fat based on height and weight. Your health care provider can help determine your BMI and help you achieve or maintain a healthy weight. Get regular exercise Get regular exercise. This is one of the most important things you can do for your health. Most adults should: Exercise for at least 150 minutes each week. The exercise should increase your heart rate and make you sweat (moderate-intensity exercise). Do strengthening exercises at least twice a week. This is in addition to the moderate-intensity exercise. Spend less time sitting. Even light physical activity can be beneficial. Watch cholesterol and blood lipids Have your blood tested for lipids and cholesterol at 55 years of age, then have this test every 5 years. Have your cholesterol levels checked more often if: Your lipid or cholesterol levels are high. You are older than 55 years of age. You are at high risk for heart  disease. What should I know about cancer screening? Depending on your health history and family history, you may need to have cancer screening at various ages. This may include screening for: Breast cancer. Cervical cancer. Colorectal cancer. Skin cancer. Lung cancer. What should I know about heart disease, diabetes, and high blood pressure? Blood pressure and heart disease High blood pressure causes heart disease and increases the risk of stroke. This is more likely to develop in people who have high blood pressure readings or are overweight. Have your blood pressure checked: Every 3-5 years if you are 22-32 years of age. Every year if you are 20 years old or older. Diabetes Have regular diabetes screenings. This checks your fasting blood sugar level. Have the screening done: Once every three years after age 44 if you are at a normal weight and have a low risk for diabetes. More often and at a younger age if you are overweight or have a high risk for diabetes. What should I know about preventing infection? Hepatitis B If you have a higher risk for hepatitis B, you should be screened for this virus. Talk with your health care provider to find out if you are at risk for hepatitis B infection. Hepatitis C Testing is recommended for: Everyone born from [birth year] through 1964. Anyone with known risk factors for hepatitis C. Sexually transmitted infections (STIs) Get screened for STIs, including gonorrhea and chlamydia, if: You are sexually active and are younger than 55 years of age. You are older  than 55 years of age and your health care provider tells you that you are at risk for this type of infection. Your sexual activity has changed since you were last screened, and you are at increased risk for chlamydia or gonorrhea. Ask your health care provider if you are at risk. Ask your health care provider about whether you are at high risk for HIV. Your health care provider may recommend a  prescription medicine to help prevent HIV infection. If you choose to take medicine to prevent HIV, you should first get tested for HIV. You should then be tested every 3 months for as long as you are taking the medicine. Pregnancy If you are about to stop having your period (premenopausal) and you may become pregnant, seek counseling before you get pregnant. Take 400 to 800 micrograms (mcg) of folic acid every day if you become pregnant. Ask for birth control (contraception) if you want to prevent pregnancy. Osteoporosis and menopause Osteoporosis is a disease in which the bones lose minerals and strength with aging. This can result in bone fractures. If you are 30 years old or older, or if you are at risk for osteoporosis and fractures, ask your health care provider if you should: Be screened for bone loss. Take a calcium or vitamin D supplement to lower your risk of fractures. Be given hormone replacement therapy (HRT) to treat symptoms of menopause. Follow these instructions at home: Alcohol use Do not drink alcohol if: Your health care provider tells you not to drink. You are pregnant, may be pregnant, or are planning to become pregnant. If you drink alcohol: Limit how much you have to: 0-1 drink a day. Know how much alcohol is in your drink. In the [place] one drink equals one 12 oz bottle of beer (355 mL), one 5 oz glass of wine (148 mL), or one 1 oz glass of hard liquor (44 mL). Lifestyle Do not use any products that contain nicotine or tobacco. These products include cigarettes, chewing tobacco, and vaping devices, such as e-cigarettes. If you need help quitting, ask your health care provider. Do not use street drugs. Do not share needles. Ask your health care provider for help if you need support or information about quitting drugs. General instructions Schedule regular health, dental, and eye exams. Stay current with your vaccines. Tell your health care provider if: You often  feel depressed. You have ever been abused or do not feel safe at home. Summary Adopting a healthy lifestyle and getting preventive care are important in promoting health and wellness. Follow your health care provider's instructions about healthy diet, exercising, and getting tested or screened for diseases. Follow your health care provider's instructions on monitoring your cholesterol and blood pressure. This information is not intended to replace advice given to you by your health care provider. Make sure you discuss any questions you have with your health care provider. Document Revised: 01/22/2021 Document Reviewed: 01/22/2021 Elsevier Patient Education  [employer].

## 2022-05-06 NOTE — Progress Notes (Unsigned)
Subjective:    Patient ID: Dawn Townsend, female    DOB: Jan 21, 1966, 56 y.o.   MRN: [id number]      HPI Shalika is here for a Physical exam.   Her left foot/lower leg is more swollen than the right.  They worsen throughout the day and do not improve overnight while sleeping.  She does stand most of the day.  She does wear compression socks.  She has bilateral knee arthritis.  She denies any episode of inactivity or travel.  Increased welling has been going on for a couple of weeks.  She is thinking about knee surgery.  Has had difficulty swallowing the extended release metformin-wonders about alternative  Medications and allergies reviewed with patient and updated if appropriate.  Current Outpatient Medications on File Prior to Visit  Medication Sig Dispense Refill   Cholecalciferol (VITAMIN D PO) Take by mouth.     diclofenac (VOLTAREN) 75 MG EC tablet Take 1 tablet (75 mg total) by mouth 2 (two) times daily. 60 tablet 1   lansoprazole (PREVACID) 30 MG capsule Take 1 capsule (30 mg total) by mouth every morning before breakfast 90 capsule 1   montelukast (SINGULAIR) 10 MG tablet Take 1 tablet (10 mg total) by mouth at bedtime. 90 tablet 0   nebivolol (BYSTOLIC) 10 MG tablet Take 1 tablet (10 mg total) by mouth daily. 90 tablet 0   valsartan-hydrochlorothiazide (DIOVAN-HCT) 160-12.5 MG tablet Take 1 tablet by mouth daily. 90 tablet 3   albuterol (VENTOLIN HFA) 108 (90 Base) MCG/ACT inhaler INHALE 1-2 PUFFS INTO THE LUNGS EVERY 6 (SIX) HOURS AS NEEDED. 18 g 5   No current facility-administered medications on file prior to visit.    Review of Systems  Constitutional:  Negative for fever.  Eyes:  Negative for visual disturbance.  Respiratory:  Positive for shortness of breath (weight related). Negative for cough and wheezing.   Cardiovascular:  Positive for leg swelling (left foot). Negative for chest pain and palpitations.  Gastrointestinal:  Negative for abdominal pain, blood in  stool, constipation, diarrhea and nausea.       Gerd controlled  Genitourinary:  Positive for dysuria.  Musculoskeletal:  Positive for arthralgias. Negative for back pain.  Skin:  Negative for rash.  Neurological:  Negative for light-headedness and headaches.  Psychiatric/Behavioral:  Positive for dysphoric mood (minimal) and sleep disturbance (hot flashes). The patient is nervous/anxious (minimal).        Objective:   Vitals:   05/07/22 1303 05/07/22 1339  BP: (!) 140/90 136/82  Pulse: 69   Temp: 98.1 F (36.7 C)   SpO2: 99%    Filed Weights   05/07/22 1303  Weight: 298 lb (135.2 kg)   Body mass index is 42.76 kg/m.  BP Readings from Last 3 Encounters:  05/07/22 136/82  02/22/22 138/82  02/08/22 120/80    Wt Readings from Last 3 Encounters:  05/07/22 298 lb (135.2 kg)  02/22/22 (!) 301 lb (136.5 kg)  02/08/22 298 lb 9.6 oz (135.4 kg)       Physical Exam Constitutional: She appears well-developed and well-nourished. No distress.  HENT:  Head: Normocephalic and atraumatic.  Right Ear: External ear normal. Normal ear canal and TM Left Ear: External ear normal.  Normal ear canal and TM Mouth/Throat: Oropharynx is clear and moist.  Eyes: Conjunctivae normal.  Neck: Neck supple. No tracheal deviation present. No thyromegaly present.  No carotid bruit  Cardiovascular: Normal rate, regular rhythm and normal heart sounds.  No murmur heard.  Mild left lower extremity edema.  Trace right lower extremity edema Pulmonary/Chest: Effort normal and breath sounds normal. No respiratory distress. She has no wheezes. She has no rales.  Breast: deferred   Abdominal: Soft. She exhibits no distension. There is no tenderness.  Lymphadenopathy: She has no cervical adenopathy.  Skin: Skin is warm and dry. She is not diaphoretic.  Psychiatric: She has a normal mood and affect. Her behavior is normal.     Lab Results  Component Value Date   WBC 4.1 07/03/2021   HGB 13.6  07/03/2021   HCT 42.9 07/03/2021   PLT 244.0 07/03/2021   GLUCOSE 81 07/03/2021   CHOL 203 (H) 07/03/2021   TRIG 82.0 07/03/2021   HDL 67.90 07/03/2021   LDLCALC 118 (H) 07/03/2021   ALT 19 07/03/2021   AST 15 07/03/2021   NA 138 07/03/2021   K 4.2 07/03/2021   CL 98 07/03/2021   CREATININE 0.62 07/03/2021   BUN 17 07/03/2021   CO2 31 07/03/2021   TSH 1.64 07/03/2021   HGBA1C 6.0 07/03/2021         Assessment & Plan:   Physical exam: Screening blood work  ordered Exercise stressed importance of regular exercise Weight encouraged weight loss-discussed high-protein, high vegetable diet.  Incorporate good fibers.  Diet low in carbs and sugars.  Continue increased water intake-has stopped soda and juices.  Discussed importance of decreased portions.  Discussed that is okay with me mildly hungry intermittently Substance abuse  none   Reviewed recommended immunizations.   Health Maintenance  Topic Date Due   COLONOSCOPY (Pts 45-82yr Insurance coverage will need to be confirmed)  Never done   MAMMOGRAM  Never done   Zoster Vaccines- Shingrix (1 of 2) Never done   PAP SMEAR-Modifier  09/14/2018   COVID-19 Vaccine (1) 05/23/2022 (Originally 06/05/1966)   INFLUENZA VACCINE  12/15/2022 (Originally 04/16/2022)   TETANUS/TDAP  05/20/2023   Hepatitis C Screening  Completed   HIV Screening  Completed   HPV VACCINES  Aged Out     Will schedule gyn appt and have pap and mammogram  Referred to GI for colonoscopy   See Problem List for Assessment and Plan of chronic medical problems.

## 2022-05-07 ENCOUNTER — Ambulatory Visit (INDEPENDENT_AMBULATORY_CARE_PROVIDER_SITE_OTHER): Payer: 59 | Admitting: Internal Medicine

## 2022-05-07 ENCOUNTER — Other Ambulatory Visit (HOSPITAL_COMMUNITY): Payer: Self-pay

## 2022-05-07 VITALS — BP 136/82 | HR 69 | Temp 98.1°F | Ht 70.0 in | Wt 298.0 lb

## 2022-05-07 DIAGNOSIS — R300 Dysuria: Secondary | ICD-10-CM

## 2022-05-07 DIAGNOSIS — M25562 Pain in left knee: Secondary | ICD-10-CM

## 2022-05-07 DIAGNOSIS — R7303 Prediabetes: Secondary | ICD-10-CM | POA: Diagnosis not present

## 2022-05-07 DIAGNOSIS — Z1211 Encounter for screening for malignant neoplasm of colon: Secondary | ICD-10-CM

## 2022-05-07 DIAGNOSIS — I10 Essential (primary) hypertension: Secondary | ICD-10-CM

## 2022-05-07 DIAGNOSIS — M25561 Pain in right knee: Secondary | ICD-10-CM | POA: Diagnosis not present

## 2022-05-07 DIAGNOSIS — Z0000 Encounter for general adult medical examination without abnormal findings: Secondary | ICD-10-CM

## 2022-05-07 DIAGNOSIS — K219 Gastro-esophageal reflux disease without esophagitis: Secondary | ICD-10-CM

## 2022-05-07 DIAGNOSIS — J45909 Unspecified asthma, uncomplicated: Secondary | ICD-10-CM

## 2022-05-07 DIAGNOSIS — G8929 Other chronic pain: Secondary | ICD-10-CM

## 2022-05-07 LAB — CBC WITH DIFFERENTIAL/PLATELET
Basophils Absolute: 0 10*3/uL (ref 0.0–0.1)
Basophils Relative: 0.5 % (ref 0.0–3.0)
Eosinophils Absolute: 0.2 10*3/uL (ref 0.0–0.7)
Eosinophils Relative: 3.9 % (ref 0.0–5.0)
HCT: 39.3 % (ref 36.0–46.0)
Hemoglobin: 12.8 g/dL (ref 12.0–15.0)
Lymphocytes Relative: 28.4 % (ref 12.0–46.0)
Lymphs Abs: 1.3 10*3/uL (ref 0.7–4.0)
MCHC: 32.7 g/dL (ref 30.0–36.0)
MCV: 82.3 fl (ref 78.0–100.0)
Monocytes Absolute: 0.8 10*3/uL (ref 0.1–1.0)
Monocytes Relative: 18.6 % — ABNORMAL HIGH (ref 3.0–12.0)
Neutro Abs: 2.2 10*3/uL (ref 1.4–7.7)
Neutrophils Relative %: 48.6 % (ref 43.0–77.0)
Platelets: 218 10*3/uL (ref 150.0–400.0)
RBC: 4.78 Mil/uL (ref 3.87–5.11)
RDW: 15.2 % (ref 11.5–15.5)
WBC: 4.4 10*3/uL (ref 4.0–10.5)

## 2022-05-07 LAB — COMPREHENSIVE METABOLIC PANEL
ALT: 16 U/L (ref 0–35)
AST: 16 U/L (ref 0–37)
Albumin: 3.9 g/dL (ref 3.5–5.2)
Alkaline Phosphatase: 94 U/L (ref 39–117)
BUN: 21 mg/dL (ref 6–23)
CO2: 31 mEq/L (ref 19–32)
Calcium: 9.7 mg/dL (ref 8.4–10.5)
Chloride: 99 mEq/L (ref 96–112)
Creatinine, Ser: 0.69 mg/dL (ref 0.40–1.20)
GFR: 97.7 mL/min (ref >60.00)
Glucose, Bld: 74 mg/dL (ref 70–99)
Potassium: 4 mEq/L (ref 3.5–5.1)
Sodium: 138 mEq/L (ref 135–145)
Total Bilirubin: 0.5 mg/dL (ref 0.2–1.2)
Total Protein: 7.7 g/dL (ref 6.0–8.3)

## 2022-05-07 LAB — LIPID PANEL
Cholesterol: 214 mg/dL — ABNORMAL HIGH (ref 0–200)
HDL: 58.1 mg/dL (ref >39.00)
LDL Cholesterol: 136 mg/dL — ABNORMAL HIGH (ref 0–99)
NonHDL: 155.43
Total CHOL/HDL Ratio: 4
Triglycerides: 98 mg/dL (ref 0.0–149.0)
VLDL: 19.6 mg/dL (ref 0.0–40.0)

## 2022-05-07 LAB — HEMOGLOBIN A1C: Hgb A1c MFr Bld: 6.3 % (ref 4.6–6.5)

## 2022-05-07 LAB — TSH: TSH: 2.25 u[IU]/mL (ref 0.35–5.50)

## 2022-05-07 MED ORDER — METFORMIN HCL 500 MG PO TABS
500.0000 mg | ORAL_TABLET | Freq: Two times a day (BID) | ORAL | 5 refills | Status: DC
  Filled 2022-05-07: qty 60, 30d supply, fill #0

## 2022-05-07 NOTE — Assessment & Plan Note (Addendum)
Chronic Mild, intermittent Controlled Singular 10 mg daily Continue albuterol inhaler as needed

## 2022-05-07 NOTE — Assessment & Plan Note (Addendum)
Chronic Blood pressure adequately controlled CMP Continue Bystolic 10 mg daily, Diovan-HCT 160-12.5 mg daily

## 2022-05-07 NOTE — Assessment & Plan Note (Signed)
Chronic Bilateral osteoarthritis Following with orthopedics Taking diclofenac 75 mg twice daily

## 2022-05-07 NOTE — Assessment & Plan Note (Signed)
Acute Check UA, UCx to r/o UTI

## 2022-05-07 NOTE — Assessment & Plan Note (Signed)
Chronic GERD controlled Continue lansoprazole 30 mg daily

## 2022-05-07 NOTE — Assessment & Plan Note (Addendum)
Chronic Check a1c Low sugar / carb diet Stressed regular exercise Could not swallow metformin Xr pills - will try metformin 500 mg bid

## 2022-05-07 NOTE — Assessment & Plan Note (Signed)
Chronic Stressed weight loss Discussed importance of high-protein, high fiber, high vegetable diet-low in carbs and sugars Stressed decrease portions-decrease calorie intake to lose weight Continue increased water Continue to avoid sodas and juices

## 2022-05-14 ENCOUNTER — Other Ambulatory Visit (HOSPITAL_COMMUNITY): Payer: Self-pay

## 2022-05-14 MED ORDER — HYDROCODONE-ACETAMINOPHEN 5-325 MG PO TABS
1.0000 | ORAL_TABLET | Freq: Two times a day (BID) | ORAL | 0 refills | Status: DC | PRN
  Filled 2022-05-14: qty 10, 5d supply, fill #0

## 2022-05-15 ENCOUNTER — Other Ambulatory Visit: Payer: Self-pay

## 2022-05-15 ENCOUNTER — Emergency Department (HOSPITAL_COMMUNITY): Payer: 59

## 2022-05-15 ENCOUNTER — Encounter (HOSPITAL_COMMUNITY): Payer: Self-pay

## 2022-05-15 ENCOUNTER — Emergency Department (HOSPITAL_BASED_OUTPATIENT_CLINIC_OR_DEPARTMENT_OTHER): Payer: 59

## 2022-05-15 ENCOUNTER — Emergency Department (HOSPITAL_COMMUNITY)
Admission: EM | Admit: 2022-05-15 | Discharge: 2022-05-15 | Disposition: A | Discharge status: Home / Self Care | Payer: 59 | Attending: Emergency Medicine | Admitting: Emergency Medicine

## 2022-05-15 DIAGNOSIS — R9431 Abnormal electrocardiogram [ECG] [EKG]: Secondary | ICD-10-CM | POA: Diagnosis not present

## 2022-05-15 DIAGNOSIS — R0602 Shortness of breath: Secondary | ICD-10-CM | POA: Insufficient documentation

## 2022-05-15 DIAGNOSIS — R079 Chest pain, unspecified: Secondary | ICD-10-CM | POA: Diagnosis not present

## 2022-05-15 DIAGNOSIS — R52 Pain, unspecified: Secondary | ICD-10-CM

## 2022-05-15 DIAGNOSIS — M7989 Other specified soft tissue disorders: Secondary | ICD-10-CM | POA: Diagnosis not present

## 2022-05-15 DIAGNOSIS — R7989 Other specified abnormal findings of blood chemistry: Secondary | ICD-10-CM | POA: Diagnosis not present

## 2022-05-15 LAB — BASIC METABOLIC PANEL
Anion gap: 9 (ref 5–15)
BUN: 18 mg/dL (ref 6–20)
CO2: 26 mmol/L (ref 22–32)
Calcium: 9.2 mg/dL (ref 8.9–10.3)
Chloride: 104 mmol/L (ref 98–111)
Creatinine, Ser: 0.71 mg/dL (ref 0.44–1.00)
GFR, Estimated: >60 mL/min (ref >60)
Glucose, Bld: 83 mg/dL (ref 70–99)
Potassium: 3.8 mmol/L (ref 3.5–5.1)
Sodium: 139 mmol/L (ref 135–145)

## 2022-05-15 LAB — CBC WITH DIFFERENTIAL/PLATELET
Abs Immature Granulocytes: 0.01 10*3/uL (ref 0.00–0.07)
Basophils Absolute: 0 10*3/uL (ref 0.0–0.1)
Basophils Relative: 0 %
Eosinophils Absolute: 0.1 10*3/uL (ref 0.0–0.5)
Eosinophils Relative: 3 %
HCT: 43.2 % (ref 36.0–46.0)
Hemoglobin: 13.9 g/dL (ref 12.0–15.0)
Immature Granulocytes: 0 %
Lymphocytes Relative: 25 %
Lymphs Abs: 1.1 10*3/uL (ref 0.7–4.0)
MCH: 26.6 pg (ref 26.0–34.0)
MCHC: 32.2 g/dL (ref 30.0–36.0)
MCV: 82.8 fL (ref 80.0–100.0)
Monocytes Absolute: 0.5 10*3/uL (ref 0.1–1.0)
Monocytes Relative: 11 %
Neutro Abs: 2.6 10*3/uL (ref 1.7–7.7)
Neutrophils Relative %: 61 %
Platelets: 209 10*3/uL (ref 150–400)
RBC: 5.22 MIL/uL — ABNORMAL HIGH (ref 3.87–5.11)
RDW: 15.1 % (ref 11.5–15.5)
WBC: 4.2 10*3/uL (ref 4.0–10.5)
nRBC: 0 % (ref 0.0–0.2)

## 2022-05-15 LAB — TROPONIN I (HIGH SENSITIVITY)
Troponin I (High Sensitivity): 4 ng/L (ref <18)
Troponin I (High Sensitivity): 5 ng/L (ref <18)

## 2022-05-15 LAB — D-DIMER, QUANTITATIVE: D-Dimer, Quant: 0.71 ug/mL-FEU — ABNORMAL HIGH (ref 0.00–0.50)

## 2022-05-15 LAB — BRAIN NATRIURETIC PEPTIDE: B Natriuretic Peptide: 15 pg/mL (ref 0.0–100.0)

## 2022-05-15 MED ORDER — IOHEXOL 350 MG/ML SOLN
100.0000 mL | Freq: Once | INTRAVENOUS | Status: AC | PRN
  Administered 2022-05-15: 100 mL via INTRAVENOUS

## 2022-05-15 MED ORDER — ALBUTEROL SULFATE HFA 108 (90 BASE) MCG/ACT IN AERS: 2.0000 | INHALATION_SPRAY | RESPIRATORY_TRACT | Status: DC | PRN

## 2022-05-15 NOTE — Progress Notes (Signed)
Lower extremity venous left study completed.  Preliminary results relayed to [place], [place].  See CV Proc for preliminary results report.   Darlin Coco, RDMS, RVT

## 2022-05-15 NOTE — Discharge Instructions (Signed)
Your evaluation today was reassuring, did not show signs of a heart attack or heart failure, no evidence of a blood clot or pneumonia.  If you continue to feel short of breath please follow-up closely with your primary care provider for further evaluation.  If you develop worsening shortness of breath, fevers, feel as though you are going to pass out or have other new or concerning symptoms return for reevaluation.

## 2022-05-15 NOTE — ED Provider Notes (Signed)
[place] EMERGENCY DEPARTMENT Provider Note   CSN: [id number] Arrival date & time: 05/15/22  1017     History  Chief Complaint  Patient presents with   Shortness of Breath    Dawn Townsend is a 56 y.o. female.   Shortness of Breath      Home Medications Prior to Admission medications   Medication Sig Start Date End Date Taking? Authorizing Provider  albuterol (VENTOLIN HFA) 108 (90 Base) MCG/ACT inhaler INHALE 1-2 PUFFS INTO THE LUNGS EVERY 6 (SIX) HOURS AS NEEDED. 09/22/20 09/22/21  Binnie Rail, MD  Cholecalciferol (VITAMIN D PO) Take by mouth.    [provider]  diclofenac (VOLTAREN) 75 MG EC tablet Take 1 tablet (75 mg total) by mouth 2 (two) times daily. 04/22/22     HYDROcodone-acetaminophen (NORCO/VICODIN) 5-325 MG tablet Take 1 tablet by mouth 2 (two) times daily as needed. 05/14/22     lansoprazole (PREVACID) 30 MG capsule Take 1 capsule (30 mg total) by mouth every morning before breakfast 11/20/21   Binnie Rail, MD  metFORMIN (GLUCOPHAGE) 500 MG tablet Take 1 tablet (500 mg total) by mouth 2 (two) times daily with a meal. 05/07/22   Burns, Claudina Lick, MD  montelukast (SINGULAIR) 10 MG tablet Take 1 tablet (10 mg total) by mouth at bedtime. 02/19/22   Binnie Rail, MD  nebivolol (BYSTOLIC) 10 MG tablet Take 1 tablet (10 mg total) by mouth daily. 03/22/22   Binnie Rail, MD  valsartan-hydrochlorothiazide (DIOVAN-HCT) 160-12.5 MG tablet Take 1 tablet by mouth daily. 02/22/22   Binnie Rail, MD      Allergies    Patient has no known allergies.    Review of Systems   Review of Systems  Respiratory:  Positive for shortness of breath.     Physical Exam Updated Vital Signs BP 135/81 (BP Location: Right Arm)   Pulse 67   Temp 98 F (36.7 C)   Resp 16   Ht '5\' 10"'$  (1.778 m)   Wt 134.3 kg   LMP 06/02/2014   SpO2 98%   BMI 42.47 kg/m  Physical Exam  ED Results / Procedures / Treatments   Labs (all labs ordered are listed, but only  abnormal results are displayed) Labs Reviewed  CBC WITH DIFFERENTIAL/PLATELET - Abnormal; Notable for the following components:      Result Value   RBC 5.22 (*)    All other components within normal limits  D-DIMER, QUANTITATIVE - Abnormal; Notable for the following components:   D-Dimer, Quant 0.71 (*)    All other components within normal limits  BASIC METABOLIC PANEL  BRAIN NATRIURETIC PEPTIDE  TROPONIN I (HIGH SENSITIVITY)  TROPONIN I (HIGH SENSITIVITY)    EKG EKG Interpretation  Date/Time:  Wednesday May 15 2022 09:40:32 EDT Ventricular Rate:  68 PR Interval:  150 QRS Duration: 78 QT Interval:  408 QTC Calculation: 433 R Axis:   -19 Text Interpretation: Normal sinus rhythm Minimal voltage criteria for LVH, may be normal variant ( R in aVL ) Possible Anterior infarct , age undetermined Abnormal ECG When compared with ECG of 06-Jun-2014 07:41, PREVIOUS ECG IS PRESENT Confirmed by Nanda Quinton [phone]) on 05/16/2022 2:25:16 PM  Radiology VAS [place] LOWER EXTREMITY VENOUS (DVT) (7a-7p)  Result Date: 05/15/2022  Lower Venous DVT Study Patient Name:  Dawn Townsend  Date of Exam:   05/15/2022 Medical Rec #: [id number]     Accession #:    [id number] Date of Birth:  October 31, 1965     Patient Gender: F Patient Age:   43 years Exam Location:  [facility] [place] Procedure:      VAS [place] LOWER EXTREMITY VENOUS (DVT) Referring Phys: Merleen Nicely Romney Compean --------------------------------------------------------------------------------  Indications: Pain and swelling of left leg, ongoing x2-3 weeks, new SOB.  Comparison Study: No prior studies. Performing Technologist: Darlin Coco RDMS, RVT  Examination Guidelines: A complete evaluation includes B-mode imaging, spectral Doppler, color Doppler, and power Doppler as needed of all accessible portions of each vessel. Bilateral testing is considered an integral part of a complete examination. Limited examinations for reoccurring indications may be performed as noted. The  reflux portion of the exam is performed with the patient in reverse Trendelenburg.  +-----+---------------+---------+-----------+----------+--------------+ RIGHTCompressibilityPhasicitySpontaneityPropertiesThrombus Aging +-----+---------------+---------+-----------+----------+--------------+ CFV  Full           Yes      Yes                                 +-----+---------------+---------+-----------+----------+--------------+   +---------+---------------+---------+-----------+----------+--------------+ LEFT     CompressibilityPhasicitySpontaneityPropertiesThrombus Aging +---------+---------------+---------+-----------+----------+--------------+ CFV      Full           Yes      Yes                                 +---------+---------------+---------+-----------+----------+--------------+ SFJ      Full                                                        +---------+---------------+---------+-----------+----------+--------------+ FV Prox  Full                                                        +---------+---------------+---------+-----------+----------+--------------+ FV Mid   Full                                                        +---------+---------------+---------+-----------+----------+--------------+ FV DistalFull                                                        +---------+---------------+---------+-----------+----------+--------------+ PFV      Full                                                        +---------+---------------+---------+-----------+----------+--------------+ POP      Full           Yes      Yes                                 +---------+---------------+---------+-----------+----------+--------------+  PTV      Full                                                        +---------+---------------+---------+-----------+----------+--------------+ PERO     Full                                                         +---------+---------------+---------+-----------+----------+--------------+     Summary: RIGHT: - No evidence of common femoral vein obstruction.  LEFT: - There is no evidence of deep vein thrombosis in the lower extremity.  - A cystic structure is found in the popliteal fossa measuring 2.6 x 0.9 cm.  *See table(s) above for measurements and observations. Electronically signed by Jamelle Haring on 05/15/2022 at 4:51:43 PM.    Final    CT Angio Chest PE W and/or Wo Contrast  Result Date: 05/15/2022 CLINICAL DATA:  Pulmonary embolism suspected. Positive D-dimer. Chest pain and shortness of breath. EXAM: CT ANGIOGRAPHY CHEST WITH CONTRAST TECHNIQUE: Multidetector CT imaging of the chest was performed using the standard protocol during bolus administration of intravenous contrast. Multiplanar CT image reconstructions and MIPs were obtained to evaluate the vascular anatomy. RADIATION DOSE REDUCTION: This exam was performed according to the departmental dose-optimization program which includes automated exposure control, adjustment of the mA and/or kV according to patient size and/or use of iterative reconstruction technique. CONTRAST:  130m OMNIPAQUE IOHEXOL 350 MG/ML SOLN COMPARISON:  Chest radiography same day FINDINGS: Cardiovascular: Heart size is normal. No pericardial fluid. No visible coronary artery calcification. Minimal aortic atherosclerotic calcification. Pulmonary arterial opacification is good. There are no pulmonary emboli. Mediastinum/Nodes: No mass or lymphadenopathy. Lungs/Pleura: No pleural effusion. The lungs are clear. No infiltrate or collapse. No mass or nodule. Upper Abdomen: No acute finding.  Previous cholecystectomy. Musculoskeletal: Ordinary thoracic degenerative changes. Review of the MIP images confirms the above findings. IMPRESSION: No pulmonary emboli or other acute chest pathology. Aortic atherosclerosis, minimal. Electronically Signed   By: MNelson ChimesM.D.   On:  05/15/2022 16:17   DG Chest 2 View  Result Date: 05/15/2022 CLINICAL DATA:  Chest pain with shortness of breath. Difficulty breathing. EXAM: CHEST - 2 VIEW COMPARISON:  08/31/2014. FINDINGS: The heart size is at the upper limits of normal. The mediastinal contours are stable. The lungs appear clear. There is no pleural effusion or pneumothorax. No acute osseous findings are evident. There are mild degenerative changes in the spine. IMPRESSION: Borderline heart size. No evidence of acute cardiopulmonary process. Electronically Signed   By: WRichardean SaleM.D.   On: 05/15/2022 10:07     Procedures Procedures    Medications Ordered in ED Medications  iohexol (OMNIPAQUE) 350 MG/ML injection 100 mL (100 mLs Intravenous Contrast Given 05/15/22 1540)    ED Course/ Medical Decision Making/ A&P                           Medical Decision Making Amount and/or Complexity of Data Reviewed Labs: ordered. Radiology: ordered.  Risk Prescription drug management.   56y.o. female presents to the ED with complaints of shortness of breath, this involves an  extensive number of treatment options, and is a complaint that carries with it a high risk of complications and morbidity.  The differential diagnosis includes pneumonia, CHF, ACS, asthma, PE, COVID, viral URI , anemia  On arrival pt is nontoxic, vitals WNL.  Additional history obtained from chart review. Previous records obtained and reviewed   Lab Tests:  I Ordered, reviewed, and interpreted labs, which included: No leukocytosis, no anemia, no electrolyte derangements, trop WNL, BNP is not elevated to suggest CHF, D-Dimer elevated at 0.71, will proceed with CTA  Imaging Studies ordered:  I ordered imaging studies which included CXR, CTA chest, DVT study, I independently visualized and interpreted imaging which showed CXR wit borderline cardiomegaly, no edema or pneumonia, CTA without PE, DVT US neg  ED Course:   Workup is overall reassuring,  discussed results at bedside with pt. VS have been reassuring and pt ambulatory without increased work of breathing. Considered admisson but given reassuring workup stable to outpatient  follow up. Will DC home with close outpatient follow up.  At this time there does not appear to be any evidence of an acute emergency medical condition requiring further emergent evaluation and the patient appears stable for discharge with appropriate outpatient follow up. Diagnosis and return precautions discussed with patient who verbalizes understanding and is agreeable to discharge.       Portions of this note were generated with [employer]. Dictation errors may occur despite best attempts at proofreading.         Final Clinical Impression(s) / ED Diagnoses Final diagnoses:  SOB (shortness of breath)    Rx / DC Orders ED Discharge Orders     None         Janet Berlin 06/11/22 1404    Davonna Belling, MD 06/14/22 [phone]

## 2022-05-15 NOTE — ED Triage Notes (Signed)
Pt arrived POV from the kitchen downstairs stating that starting this morning whenever she stands she gets [facility]. Pt states she has had it before with exertion but never just standing.

## 2022-05-17 ENCOUNTER — Other Ambulatory Visit: Payer: Self-pay | Admitting: Internal Medicine

## 2022-05-17 ENCOUNTER — Other Ambulatory Visit (HOSPITAL_COMMUNITY): Payer: Self-pay

## 2022-05-17 MED ORDER — MONTELUKAST SODIUM 10 MG PO TABS
10.0000 mg | ORAL_TABLET | Freq: Every day | ORAL | 0 refills | Status: DC
  Filled 2022-05-17: qty 90, 90d supply, fill #0

## 2022-05-21 ENCOUNTER — Other Ambulatory Visit (HOSPITAL_COMMUNITY): Payer: Self-pay

## 2022-05-21 ENCOUNTER — Other Ambulatory Visit: Payer: Self-pay | Admitting: Internal Medicine

## 2022-05-21 MED ORDER — LANSOPRAZOLE 30 MG PO CPDR
30.0000 mg | DELAYED_RELEASE_CAPSULE | Freq: Every morning | ORAL | 1 refills | Status: DC
  Filled 2022-05-21: qty 90, 90d supply, fill #0
  Filled 2022-08-22: qty 90, 90d supply, fill #1

## 2022-05-22 ENCOUNTER — Other Ambulatory Visit (HOSPITAL_COMMUNITY): Payer: Self-pay

## 2022-06-18 ENCOUNTER — Other Ambulatory Visit: Payer: Self-pay | Admitting: Internal Medicine

## 2022-06-18 ENCOUNTER — Other Ambulatory Visit (HOSPITAL_COMMUNITY): Payer: Self-pay

## 2022-06-18 MED ORDER — NEBIVOLOL HCL 10 MG PO TABS
10.0000 mg | ORAL_TABLET | Freq: Every day | ORAL | 0 refills | Status: DC
  Filled 2022-06-18: qty 90, 90d supply, fill #0

## 2022-06-18 MED ORDER — DICLOFENAC SODIUM 75 MG PO TBEC
75.0000 mg | DELAYED_RELEASE_TABLET | Freq: Two times a day (BID) | ORAL | 1 refills | Status: DC
Start: 2022-06-18 — End: 2022-12-30
  Filled 2022-06-18: qty 60, 30d supply, fill #0
  Filled 2022-07-23: qty 60, 30d supply, fill #1

## 2022-07-03 ENCOUNTER — Other Ambulatory Visit (HOSPITAL_COMMUNITY): Payer: Self-pay

## 2022-07-03 DIAGNOSIS — M170 Bilateral primary osteoarthritis of knee: Secondary | ICD-10-CM | POA: Diagnosis not present

## 2022-07-03 MED ORDER — DICLOFENAC SODIUM 75 MG PO TBEC
75.0000 mg | DELAYED_RELEASE_TABLET | Freq: Two times a day (BID) | ORAL | 1 refills | Status: DC
Start: 2022-07-03 — End: 2022-10-20
  Filled 2022-08-22: qty 60, 30d supply, fill #0
  Filled 2022-09-20: qty 60, 30d supply, fill #1

## 2022-07-03 MED ORDER — TIZANIDINE HCL 4 MG PO TABS
4.0000 mg | ORAL_TABLET | Freq: Every evening | ORAL | 1 refills | Status: DC | PRN
  Filled 2022-07-03: qty 30, 30d supply, fill #0

## 2022-07-04 ENCOUNTER — Other Ambulatory Visit (HOSPITAL_COMMUNITY): Payer: Self-pay

## 2022-07-11 NOTE — Progress Notes (Signed)
Dawn Townsend is a 56 y.o. female who presents to [place] at [facility] today for Back pain follow up. Patient was last seen by Dr. Georgina Snell on 02/08/22 for Lumbar spine pain. Today patient states that he back started bothering her real bad for about a month. Patient has been out of work for three days because of the pain. Patient locates pain to across lower back and was just bad when standing and walking and now it is bad when laying. Patient describes the pain as a lot of pressure in the low back. When walking patient has to kind of sway because the pressure will get so bad.    Pertinent review of systems: No fevers or chills  Relevant historical information: Hypertension.  Obesity.   Exam:  BP 130/86   Pulse 67   Ht '5\' 10"'$  (1.778 m)   LMP 06/02/2014   SpO2 98%   BMI 42.47 kg/m  General: Well Developed, well nourished, and in no acute distress.   MSK: L-spine: Normal appearing Nontender midline. Tender palpation lumbar paraspinal musculature.  Deep Decreased lumbar motion pain with extension. Lower extremity strength is intact.    Lab and Radiology Results    EXAM: LUMBAR SPINE - COMPLETE 4+ VIEW   COMPARISON:  Lumbar spine x-ray 03/03/2016.   FINDINGS: There is no evidence of lumbar spine fracture. Alignment is normal. There is moderate disc space narrowing and endplate osteophyte formation from L2 through S1 which is similar to the prior study. There also degenerative changes of facet joints at L4-L5 and L5-S1. Soft tissues are within normal limits.   IMPRESSION: 1. 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 2. No acute abnormality.     Electronically Signed   By: Ronney Asters M.D.   On: 02/09/2022 19:18   I, Lynne Leader, personally (independently) visualized and performed the interpretation of the images attached in this note.    Assessment and Plan: 56 y.o. female with chronic low back pain.  Pain is  predominantly midline.  This is a chronic ongoing problem.  She had a trial of physical therapy recently with little benefit.  At this point an MRI of her lumbar spine to further evaluate the source of pain and for potential injection plan will be warranted.  Her most recent MRI is from 2017 and is out of date.  Anticipate likely facet injection or medial branch block and ablation of the bilateral L4-5 or L5-S1 facet joints.  If spinal stenosis is not visualized we may try epidural steroid injection at that level as well.  We can proceed directly to the injection following the MRI as I had a detailed discussion about what I anticipate will see and what our plan will be based on the MRI.   PDMP not reviewed this encounter. Orders Placed This Encounter  Procedures   MR Lumbar Spine Wo Contrast    Standing Status:   Future    Standing Expiration Date:   07/13/2023    Order Specific Question:   What is the patient's sedation requirement?    Answer:   No Sedation    Order Specific Question:   Does the patient have a pacemaker or implanted devices?    Answer:   No    Order Specific Question:   Preferred imaging location?    Answer:   [facility] (table limit-550lbs)   No orders of the defined types were placed in this encounter.  Discussed warning signs or symptoms. Please see discharge instructions. Patient expresses understanding.   The above documentation has been reviewed and is accurate and complete Lynne Leader, M.D.  Total encounter time 30 minutes including face-to-face time with the patient and, reviewing past medical record, and charting on the date of service.

## 2022-07-12 ENCOUNTER — Ambulatory Visit: Payer: 59 | Admitting: Family Medicine

## 2022-07-12 VITALS — BP 130/86 | HR 67 | Ht 70.0 in

## 2022-07-12 DIAGNOSIS — M545 Low back pain, unspecified: Secondary | ICD-10-CM | POA: Diagnosis not present

## 2022-07-12 DIAGNOSIS — M47816 Spondylosis without myelopathy or radiculopathy, lumbar region: Secondary | ICD-10-CM | POA: Diagnosis not present

## 2022-07-12 NOTE — Patient Instructions (Signed)
Thank you for coming in today.   You should hear from MRI scheduling within 1 week. If you do not hear please let me know.    Anticipate back injections following the MRI.    Facet Joint Block The facet joints connect the bones of the spine (vertebrae). They let you bend, twist, and make other movements with your spine. They also keep you from bending too far, twisting too far, and making other extreme movements. A facet joint block is a procedure where a numbing medicine (local anesthesia) is injected into a facet joint. Many times, a medicine for inflammation (steroid) is also injected. A facet joint block may be done: To diagnose neck or back pain. If the pain gets better after a facet joint block, the pain is likely coming from the facet joint. If the pain does not get better, the pain is likely not coming from the facet joint. To treat neck or back pain caused by an inflamed facet joint. To help you with physical therapy or other rehab (rehabilitation) exercises. Tell a health care provider about: Any allergies you have. All medicines you are taking, including vitamins, herbs, eye drops, creams, and over-the-counter medicines. Any problems you or family members have had with anesthesia. Any bleeding problems you have. Any surgeries you have had. Any medical conditions you have or have had. Whether you are pregnant or may be pregnant. What are the risks? Your health care provider will talk with you about risks. These may include: Infection. Allergic reactions to medicines or dyes. Bleeding. Injury to a nerve near where the needle was put in (injection site). Pain at the injection site. Short-term weakness or numbness in areas near the nerves at the injection site. What happens before the procedure? When to stop eating and drinking Follow instructions from your health care provider about what you may eat and drink. Medicines Ask your health care provider about: Changing or  stopping your regular medicines. These include any diabetes medicines or blood thinners you take. Taking medicines such as aspirin and ibuprofen. These medicines can thin your blood. Do not take these medicines unless your health care provider tells you to. Taking over-the-counter medicines, vitamins, herbs, and supplements. General instructions If you will be going home right after the procedure, plan to have a responsible adult: Take you home from the hospital or clinic. You will not be allowed to drive. Care for you for the time you are told. Ask your health care provider: How your injection site will be marked. What steps will be taken to help prevent infection. These may include washing skin with a soap that kills germs. What happens during the procedure?  An IV will be inserted into one of your veins. You will lie on your stomach on an X-ray table. You may be asked to lie in a different position if you will be getting an injection in your neck. Your injection site will be cleaned with a soap that kills germs and then covered with a germ-free (sterile) drape. A local anesthesia will be put in at the injection site. A type of X-ray machine (fluoroscopy) or CT scan will be used to help find your facet joint. A contrast dye may also be injected into your joint to help show if the needle is at the joint. When your provider knows the needle is at your joint, they will inject anesthesia and anti-inflammatory medicine as needed. The needle will be removed. Pressure will be applied to keep your injection site  from bleeding. A bandage (dressing) will be placed over each injection site. The procedure may vary among health care providers and hospitals. What happens after the procedure? Your blood pressure, heart rate, breathing rate, and blood oxygen level will be monitored until you leave the hospital or clinic. This information is not intended to replace advice given to you by your health care  provider. Make sure you discuss any questions you have with your health care provider. Document Revised: 03/15/2022 Document Reviewed: 03/15/2022 Elsevier Patient Education  [place].

## 2022-07-23 ENCOUNTER — Other Ambulatory Visit (HOSPITAL_COMMUNITY): Payer: Self-pay

## 2022-08-03 ENCOUNTER — Ambulatory Visit
Admission: RE | Admit: 2022-08-03 | Discharge: 2022-08-03 | Disposition: A | Discharge status: Home / Self Care | Payer: 59 | Source: Ambulatory Visit | Attending: Family Medicine | Admitting: Family Medicine

## 2022-08-03 DIAGNOSIS — M545 Low back pain, unspecified: Secondary | ICD-10-CM

## 2022-08-03 DIAGNOSIS — M5126 Other intervertebral disc displacement, lumbar region: Secondary | ICD-10-CM | POA: Diagnosis not present

## 2022-08-03 DIAGNOSIS — M47816 Spondylosis without myelopathy or radiculopathy, lumbar region: Secondary | ICD-10-CM

## 2022-08-03 DIAGNOSIS — M47817 Spondylosis without myelopathy or radiculopathy, lumbosacral region: Secondary | ICD-10-CM | POA: Diagnosis not present

## 2022-08-06 ENCOUNTER — Telehealth: Payer: Self-pay | Admitting: Family Medicine

## 2022-08-06 DIAGNOSIS — G8929 Other chronic pain: Secondary | ICD-10-CM

## 2022-08-06 DIAGNOSIS — M545 Low back pain, unspecified: Secondary | ICD-10-CM

## 2022-08-06 DIAGNOSIS — M47816 Spondylosis without myelopathy or radiculopathy, lumbar region: Secondary | ICD-10-CM

## 2022-08-06 NOTE — Telephone Encounter (Signed)
Facet injections ordered.

## 2022-08-06 NOTE — Progress Notes (Signed)
MRI lumbar spine shows some areas that could produce your back pain including facet arthritis like we talked about in clinic.  There is a potential ears or nerves could be pinched as well.  I have ordered facet injections at bilateral L4-5 and L5-S1.  You should hear from [facility] imaging about scheduling soon.

## 2022-08-22 ENCOUNTER — Other Ambulatory Visit (HOSPITAL_COMMUNITY): Payer: Self-pay

## 2022-08-22 ENCOUNTER — Other Ambulatory Visit: Payer: Self-pay | Admitting: Internal Medicine

## 2022-08-23 ENCOUNTER — Other Ambulatory Visit (HOSPITAL_COMMUNITY): Payer: Self-pay

## 2022-08-23 MED ORDER — MONTELUKAST SODIUM 10 MG PO TABS
10.0000 mg | ORAL_TABLET | Freq: Every day | ORAL | 0 refills | Status: DC
  Filled 2022-08-23: qty 90, 90d supply, fill #0

## 2022-09-20 ENCOUNTER — Other Ambulatory Visit (HOSPITAL_COMMUNITY): Payer: Self-pay

## 2022-09-20 ENCOUNTER — Other Ambulatory Visit: Payer: Self-pay

## 2022-09-20 ENCOUNTER — Other Ambulatory Visit: Payer: Self-pay | Admitting: Internal Medicine

## 2022-09-20 MED ORDER — NEBIVOLOL HCL 10 MG PO TABS
10.0000 mg | ORAL_TABLET | Freq: Every day | ORAL | 0 refills | Status: DC
  Filled 2022-09-20: qty 90, 90d supply, fill #0

## 2022-10-18 ENCOUNTER — Other Ambulatory Visit (HOSPITAL_COMMUNITY): Payer: Self-pay

## 2022-10-20 MED ORDER — DICLOFENAC SODIUM 75 MG PO TBEC
75.0000 mg | DELAYED_RELEASE_TABLET | Freq: Two times a day (BID) | ORAL | 1 refills | Status: DC
Start: 2022-10-20 — End: 2022-12-24
  Filled 2022-10-20: qty 60, 30d supply, fill #0
  Filled 2022-11-15: qty 60, 30d supply, fill #1

## 2022-10-21 ENCOUNTER — Other Ambulatory Visit (HOSPITAL_COMMUNITY): Payer: Self-pay

## 2022-10-23 ENCOUNTER — Other Ambulatory Visit (HOSPITAL_COMMUNITY): Payer: Self-pay

## 2022-11-07 ENCOUNTER — Ambulatory Visit: Payer: 59 | Admitting: Internal Medicine

## 2022-11-15 ENCOUNTER — Other Ambulatory Visit (HOSPITAL_COMMUNITY): Payer: Self-pay

## 2022-11-15 ENCOUNTER — Other Ambulatory Visit: Payer: Self-pay | Admitting: Internal Medicine

## 2022-11-15 MED ORDER — MONTELUKAST SODIUM 10 MG PO TABS
10.0000 mg | ORAL_TABLET | Freq: Every day | ORAL | 0 refills | Status: DC
  Filled 2022-11-15: qty 90, 90d supply, fill #0

## 2022-12-10 ENCOUNTER — Telehealth: Payer: Self-pay | Admitting: Family Medicine

## 2022-12-10 NOTE — Telephone Encounter (Signed)
Patient called stating that she is scheduled for epidural injections next week but is having a lot of pain in her lower back.  She asked if there was anything she could do in the mean time while waiting for the injections?  Can Dr Glennon Mac advise?

## 2022-12-10 NOTE — Telephone Encounter (Signed)
Messaged pt via mychart.

## 2022-12-17 ENCOUNTER — Ambulatory Visit
Admission: RE | Admit: 2022-12-17 | Discharge: 2022-12-17 | Disposition: A | Discharge status: Home / Self Care | Payer: Commercial Managed Care - PPO | Source: Ambulatory Visit | Attending: Family Medicine | Admitting: Family Medicine

## 2022-12-17 ENCOUNTER — Ambulatory Visit: Payer: 59 | Admitting: Internal Medicine

## 2022-12-17 DIAGNOSIS — M545 Low back pain, unspecified: Secondary | ICD-10-CM

## 2022-12-17 DIAGNOSIS — M47816 Spondylosis without myelopathy or radiculopathy, lumbar region: Secondary | ICD-10-CM

## 2022-12-17 DIAGNOSIS — G8929 Other chronic pain: Secondary | ICD-10-CM

## 2022-12-17 MED ORDER — METHYLPREDNISOLONE ACETATE 40 MG/ML INJ SUSP (RADIOLOG
80.0000 mg | Freq: Once | INTRAMUSCULAR | Status: AC
  Administered 2022-12-17: 80 mg via EPIDURAL

## 2022-12-17 MED ORDER — IOPAMIDOL (ISOVUE-M 200) INJECTION 41%
1.0000 mL | Freq: Once | INTRAMUSCULAR | Status: AC
  Administered 2022-12-17: 1 mL via EPIDURAL

## 2022-12-17 NOTE — Discharge Instructions (Signed)

## 2022-12-24 ENCOUNTER — Other Ambulatory Visit: Payer: Self-pay

## 2022-12-24 ENCOUNTER — Other Ambulatory Visit (HOSPITAL_COMMUNITY): Payer: Self-pay

## 2022-12-24 ENCOUNTER — Other Ambulatory Visit: Payer: Self-pay | Admitting: Internal Medicine

## 2022-12-24 MED ORDER — LANSOPRAZOLE 30 MG PO CPDR
30.0000 mg | DELAYED_RELEASE_CAPSULE | Freq: Every morning | ORAL | 1 refills | Status: DC
  Filled 2022-12-24: qty 90, 90d supply, fill #0

## 2022-12-24 MED ORDER — DICLOFENAC SODIUM 75 MG PO TBEC
75.0000 mg | DELAYED_RELEASE_TABLET | Freq: Two times a day (BID) | ORAL | 1 refills | Status: DC
  Filled 2022-12-24: qty 60, 30d supply, fill #0
  Filled 2023-01-23: qty 60, 30d supply, fill #1

## 2022-12-24 MED ORDER — NEBIVOLOL HCL 10 MG PO TABS
10.0000 mg | ORAL_TABLET | Freq: Every day | ORAL | 1 refills | Status: DC
  Filled 2022-12-24: qty 90, 90d supply, fill #0
  Filled 2023-04-03: qty 90, 90d supply, fill #1

## 2022-12-25 ENCOUNTER — Other Ambulatory Visit (HOSPITAL_COMMUNITY): Payer: Self-pay

## 2022-12-30 ENCOUNTER — Encounter: Payer: Self-pay | Admitting: Internal Medicine

## 2022-12-30 NOTE — Patient Instructions (Addendum)
      Blood work was ordered.   The lab is on the first floor.    Medications changes include :       A referral was ordered for XXX.     Someone will call you to schedule an appointment.    Return in about 6 months (around 07/02/2023) for Physical Exam.

## 2022-12-30 NOTE — Progress Notes (Unsigned)
Subjective:    Patient ID: Dawn Townsend, female    DOB: 10/28/1965, 57 y.o.   MRN: [id number]     HPI Dawn Townsend is here for follow up of her chronic medical problems.  Had first injections in back - helped minimally.  Still having pain - has some pain when sitting down occasionally.  She has constant pain when standing.  No leg pain.    Tired all the time.  Not always getting good sleep.  6hr is a good night - sometimes gets 3-4 hrs.      Medications and allergies reviewed with patient and updated if appropriate.  Current Outpatient Medications on File Prior to Visit  Medication Sig Dispense Refill   Cholecalciferol (VITAMIN D PO) Take by mouth.     diclofenac (VOLTAREN) 75 MG EC tablet Take 1 tablet (75 mg total) by mouth 2 (two) times daily. 60 tablet 1   lansoprazole (PREVACID) 30 MG capsule Take 1 capsule (30 mg total) by mouth every morning. Annual appt due in August must see provider for future refills 90 capsule 1   metFORMIN (GLUCOPHAGE) 500 MG tablet Take 1 tablet (500 mg total) by mouth 2 (two) times daily with a meal. 60 tablet 5   montelukast (SINGULAIR) 10 MG tablet Take 1 tablet (10 mg total) by mouth at bedtime. 90 tablet 0   nebivolol (BYSTOLIC) 10 MG tablet Take 1 tablet (10 mg total) by mouth daily. Annual appt due in August must see provider for future refills 90 tablet 1   tiZANidine (ZANAFLEX) 4 MG tablet Take 1 tablet by mouth at bedtime as needed 30 tablet 1   valsartan-hydrochlorothiazide (DIOVAN-HCT) 160-12.5 MG tablet Take 1 tablet by mouth daily. 90 tablet 3   albuterol (VENTOLIN HFA) 108 (90 Base) MCG/ACT inhaler INHALE 1-2 PUFFS INTO THE LUNGS EVERY 6 (SIX) HOURS AS NEEDED. 18 g 5   No current facility-administered medications on file prior to visit.     Review of Systems  Constitutional:  Positive for fatigue. Negative for fever.  Respiratory:  Positive for shortness of breath. Negative for cough and wheezing.   Cardiovascular:  Positive for leg  swelling (worse). Negative for chest pain (occ transient chest pain) and palpitations.  Musculoskeletal:  Positive for back pain.  Neurological:  Negative for light-headedness and headaches.       Objective:   Vitals:   12/31/22 1028  BP: 138/84  Pulse: 70  Temp: 98.5 F (36.9 C)  SpO2: 98%   BP Readings from Last 3 Encounters:  12/31/22 138/84  12/17/22 (!) 169/90  07/12/22 130/86   Wt Readings from Last 3 Encounters:  12/31/22 (!) 308 lb (139.7 kg)  05/15/22 296 lb (134.3 kg)  05/07/22 298 lb (135.2 kg)   Body mass index is 44.19 kg/m.    Physical Exam Constitutional:      General: She is not in acute distress.    Appearance: Normal appearance.  HENT:     Head: Normocephalic and atraumatic.  Eyes:     Conjunctiva/sclera: Conjunctivae normal.  Cardiovascular:     Rate and Rhythm: Normal rate and regular rhythm.     Heart sounds: Normal heart sounds.  Pulmonary:     Effort: Pulmonary effort is normal. No respiratory distress.     Breath sounds: Normal breath sounds. No wheezing.  Musculoskeletal:     Cervical back: Neck supple.     Right lower leg: No edema.     Left lower leg:  No edema.  Lymphadenopathy:     Cervical: No cervical adenopathy.  Skin:    General: Skin is warm and dry.     Findings: No rash.  Neurological:     Mental Status: She is alert. Mental status is at baseline.  Psychiatric:        Mood and Affect: Mood normal.        Behavior: Behavior normal.        Lab Results  Component Value Date   WBC 4.2 05/15/2022   HGB 13.9 05/15/2022   HCT 43.2 05/15/2022   PLT 209 05/15/2022   GLUCOSE 83 05/15/2022   CHOL 214 (H) 05/07/2022   TRIG 98.0 05/07/2022   HDL 58.10 05/07/2022   LDLCALC 136 (H) 05/07/2022   ALT 16 05/07/2022   AST 16 05/07/2022   NA 139 05/15/2022   K 3.8 05/15/2022   CL 104 05/15/2022   CREATININE 0.71 05/15/2022   BUN 18 05/15/2022   CO2 26 05/15/2022   TSH 2.25 05/07/2022   HGBA1C 6.3 05/07/2022      Assessment & Plan:    She would prefer to hold off on blood work until her physical in a couple of months, which is reasonable  Stressed weight loss-healthy eating, no snacking, decrease carbohydrates  See Problem List for Assessment and Plan of chronic medical problems.

## 2022-12-31 ENCOUNTER — Other Ambulatory Visit (HOSPITAL_COMMUNITY): Payer: Self-pay

## 2022-12-31 ENCOUNTER — Ambulatory Visit: Payer: Commercial Managed Care - PPO | Admitting: Internal Medicine

## 2022-12-31 VITALS — BP 138/84 | HR 70 | Temp 98.5°F | Ht 70.0 in | Wt 308.0 lb

## 2022-12-31 DIAGNOSIS — K219 Gastro-esophageal reflux disease without esophagitis: Secondary | ICD-10-CM | POA: Diagnosis not present

## 2022-12-31 DIAGNOSIS — J45909 Unspecified asthma, uncomplicated: Secondary | ICD-10-CM

## 2022-12-31 DIAGNOSIS — R7303 Prediabetes: Secondary | ICD-10-CM | POA: Diagnosis not present

## 2022-12-31 DIAGNOSIS — M545 Low back pain, unspecified: Secondary | ICD-10-CM

## 2022-12-31 DIAGNOSIS — I10 Essential (primary) hypertension: Secondary | ICD-10-CM

## 2022-12-31 DIAGNOSIS — G8929 Other chronic pain: Secondary | ICD-10-CM

## 2022-12-31 MED ORDER — VALSARTAN 160 MG PO TABS
160.0000 mg | ORAL_TABLET | Freq: Every day | ORAL | 3 refills | Status: DC
  Filled 2022-12-31: qty 90, 90d supply, fill #0
  Filled 2023-04-03: qty 90, 90d supply, fill #1
  Filled 2023-07-08: qty 90, 90d supply, fill #2
  Filled 2023-10-27: qty 90, 90d supply, fill #3

## 2022-12-31 MED ORDER — METFORMIN HCL 500 MG PO TABS
500.0000 mg | ORAL_TABLET | Freq: Two times a day (BID) | ORAL | 1 refills | Status: DC
  Filled 2022-12-31: qty 180, 90d supply, fill #0
  Filled 2023-04-03: qty 180, 90d supply, fill #1

## 2022-12-31 MED ORDER — LANSOPRAZOLE 30 MG PO CPDR
30.0000 mg | DELAYED_RELEASE_CAPSULE | Freq: Every morning | ORAL | 1 refills | Status: DC
  Filled 2022-12-31 – 2023-04-03 (×2): qty 90, 90d supply, fill #0
  Filled 2023-07-08: qty 90, 90d supply, fill #1

## 2022-12-31 MED ORDER — HYDROCHLOROTHIAZIDE 12.5 MG PO TABS
12.5000 mg | ORAL_TABLET | Freq: Every day | ORAL | 3 refills | Status: DC | PRN
  Filled 2022-12-31: qty 90, 90d supply, fill #0
  Filled 2023-04-03: qty 90, 90d supply, fill #1

## 2022-12-31 NOTE — Assessment & Plan Note (Addendum)
Chronic Blood pressure adequately controlled CMP, CBC Continue Bystolic 10 mg daily, Diovan-HCT 160-12.5 mg daily-she does not always take this medication because it is causing increased urination-will split up the Diovan and HCTZ and she can take the HCTZ as needed

## 2022-12-31 NOTE — Assessment & Plan Note (Addendum)
Chronic Pain in lower back-unable to stand for long periods of time and now occasionally feeling it when sitting, no leg pain Following with sports medicine Received 1 set of injections-unfortunately did not help much On diclofenac 75 mg twice daily Will follow-up with sports medicine

## 2022-12-31 NOTE — Assessment & Plan Note (Signed)
Chronic BMI more than 40 Discussed the importance of weight loss given prediabetes, hypertension, chronic back pain Discussed healthy diet high in protein, fiber and vegetables Discussed decreased portions/calorie intake Discussed nutrition

## 2022-12-31 NOTE — Assessment & Plan Note (Signed)
Chronic GERD controlled Continue lansoprazole 30 mg daily  

## 2022-12-31 NOTE — Assessment & Plan Note (Signed)
Chronic Check a1c Low sugar / carb diet Stressed regular exercise Continue metformin 500 mg bid 

## 2022-12-31 NOTE — Assessment & Plan Note (Signed)
Chronic Mild, intermittent Controlled Singular 10 mg daily Continue albuterol inhaler as needed 

## 2023-01-14 ENCOUNTER — Other Ambulatory Visit (HOSPITAL_COMMUNITY): Payer: Self-pay

## 2023-01-14 ENCOUNTER — Ambulatory Visit: Payer: Commercial Managed Care - PPO | Admitting: Family Medicine

## 2023-01-14 VITALS — BP 138/88 | HR 68 | Ht 70.0 in | Wt 308.0 lb

## 2023-01-14 DIAGNOSIS — M47816 Spondylosis without myelopathy or radiculopathy, lumbar region: Secondary | ICD-10-CM | POA: Diagnosis not present

## 2023-01-14 DIAGNOSIS — M545 Low back pain, unspecified: Secondary | ICD-10-CM | POA: Diagnosis not present

## 2023-01-14 DIAGNOSIS — G8929 Other chronic pain: Secondary | ICD-10-CM | POA: Diagnosis not present

## 2023-01-14 MED ORDER — PREDNISONE 50 MG PO TABS
50.0000 mg | ORAL_TABLET | Freq: Every day | ORAL | 0 refills | Status: DC
  Filled 2023-01-14: qty 5, 5d supply, fill #0

## 2023-01-14 NOTE — Patient Instructions (Addendum)
Thank you for coming in today.   I've referred you to Dr. Caprice Beaver sent a prescription for Prednisone to your pharmacy.  Let me know how it goes.

## 2023-01-14 NOTE — Progress Notes (Signed)
Dawn Payor, PhD, LAT, ATC acting as a scribe for Dawn Graham, MD.  Dawn Townsend is a 57 y.o. female who presents to [facility] at [facility] today for cont'd LBP. Pt was last seen by Dr. Denyse Amass on 07/12/22 and was advised to proceed to MRI. Based on MRI results, facet injections were ordered, and later performed on 12/17/22. In March, pt exchanged MyChart message w/ Dr. Jean Rosenthal and his AT and was advise to alternate between IBU 800mg  and Tylenol 1000mg  every 4 hours.  Today, pt reports no relief from prior facet injections. She report her low back pain is even worse than when she was seen last. She locates pain across both sides of her low back and along the midline. She was unable to work yesterday due to pain. No radiating pain. No numbness/tingling.   Dx imaging: 08/03/22 L-spine MRI  02/08/22 L-spine XR  Pertinent review of systems: No fevers or chills  Relevant historical information: Hypertension.   Exam:  BP 138/88   Pulse 68   Ht 5\' 10"  (1.778 m)   Wt (!) 308 lb (139.7 kg)   LMP 06/02/2014   SpO2 98%   BMI 44.19 kg/m  General: Well Developed, well nourished, and in no acute distress.   MSK: L-spine: Normal appearing. Decreased lumbar motion.    Lab and Radiology Results  EXAM: MRI LUMBAR SPINE WITHOUT CONTRAST   TECHNIQUE: Multiplanar, multisequence MR imaging of the lumbar spine was performed. No intravenous contrast was administered.   COMPARISON:  X-ray 02/08/2022, MRI 04/26/2016   FINDINGS: Segmentation: 5 lumbar type vertebral segments. Assimilation joint on the left at L5-S1.   Alignment: Slight dextrocurvature. Minimal grade 1 anterolisthesis of L2 on L3.   Vertebrae: No fracture, evidence of discitis, or bone lesion. Mild discogenic endplate marrow changes at the L2-3 through L4-5 levels.   Conus medullaris and cauda equina: Conus extends to the L1-2 level. Conus and cauda equina appear normal.   Paraspinal and other soft  tissues: Negative.   Disc levels:   T12-L1: Unremarkable.   L1-L2: Mild disc desiccation with shallow left foraminal protrusion. Mild bilateral facet arthropathy. Borderline-mild left foraminal stenosis. No canal stenosis. Findings have slightly progressed from prior.   L2-L3: Shallow cranially extending disc protrusion. Mild-moderate bilateral facet arthropathy. Slight subarticular recess narrowing more pronounced on the left. No significant foraminal or canal stenosis. Findings have slightly progressed from prior.   L3-L4: Disc bulge and endplate spurring, eccentric to the right. Mild bilateral facet arthropathy. Mild right foraminal stenosis. No canal stenosis. Findings have slightly progressed from prior.   L4-L5: Disc height loss with endplate spurring. Mild-moderate bilateral facet arthropathy. No significant foraminal stenosis. No canal stenosis. No significant progression.   L5-S1: No disc protrusion. Mild facet arthropathy. Degenerative changes at the left-sided assimilation joint which could affect the exited left L5 nerve root. No foraminal or canal stenosis. No significant progression.   IMPRESSION: 1. Mild degenerative changes of the lumbar spine, slightly progressed from prior. 2. Mild right foraminal stenosis at L3-4. Borderline-mild left foraminal stenosis at L1-L2. 3. No significant canal stenosis at any level. 4. Degenerative changes at the left-sided assimilation joint at L5-S1 which could affect the exited left L5 nerve root.     Electronically Signed   By: Duanne Guess D.O.   On: 08/05/2022 13:39  I, Dawn Townsend, personally (independently) visualized and performed the interpretation of the images attached in this note.  EXAM: Bilateral L4-5 and L5-S1 facet injections  COMPARISON:  MRI 08/03/2022   PROCEDURE: The procedure, risks, benefits, and alternatives were explained to the patient. Questions regarding the procedure were encouraged  and answered. The patient understands and consents to the procedure.   Posterior oblique approaches were taken to the facets on both sides at L4-5 and L5-S1 using curved 22 gauge spinal needles. Intra-articular/periarticular positioning was confirmed by injecting a small amount of Isovue-M 200. No vascular opacification is seen. Thirty mg of Depo-Medrol mixed with 0.5 cc 1% lidocaine were instilled into each joint. The procedure was well-tolerated.   FLUOROSCOPY: 1 minute 59 seconds.  312.41 micro gray meter squared   IMPRESSION: Technically successful bilateralL4-5 and L5-S60facet injection .     Electronically Signed   By: Paulina Fusi M.D.   On: 12/17/2022 12:11   I, Dawn Townsend, personally (independently) visualized and performed the interpretation of the images attached in this note.     Assessment and Plan: 57 y.o. female with chronic axial low back pain.  This has been ongoing for almost an entire year.  She has been very challenging to get controlled.  She is tried physical therapy and some medications.  Most recently we attempted facet injections lumbar spine at bilateral L4-5 and L5-S1 which did not help at all..  It is hard for me to tell where her pain generators are based on her location of pain and her MRI.  There is a few other places I could think to try injecting.  However I think she may be better served with a dedicated pain management specialist they can do these injections or even more advanced techniques such as a spinal nerve stimulator.  After discussion she agrees.  Plan to refer to Dr. Lorrine Kin for further evaluation and management for her back pain.  In the meantime work note was provided today.  I think you are probably going to need FMLA paperwork to allow her to miss occasional days for flareups. Short course of prednisone prescribed as well.   PDMP reviewed during this encounter. Orders Placed This Encounter  Procedures   Ambulatory referral to Pain  Clinic    Referral Priority:   Routine    Referral Type:   Consultation    Referral Reason:   Specialty Services Required    Referred to Provider:   Renaldo Fiddler, MD    Requested Specialty:   Pain Medicine    Number of Visits Requested:   1   Meds ordered this encounter  Medications   predniSONE (DELTASONE) 50 MG tablet    Sig: Take 1 tablet (50 mg total) by mouth daily.    Dispense:  5 tablet    Refill:  0     Discussed warning signs or symptoms. Please see discharge instructions. Patient expresses understanding.   The above documentation has been reviewed and is accurate and complete Dawn Townsend, M.D.  Total encounter time 30 minutes including face-to-face time with the patient and, reviewing past medical record, and charting on the date of service.

## 2023-01-23 ENCOUNTER — Other Ambulatory Visit (HOSPITAL_COMMUNITY): Payer: Self-pay

## 2023-02-21 DIAGNOSIS — M47816 Spondylosis without myelopathy or radiculopathy, lumbar region: Secondary | ICD-10-CM | POA: Diagnosis not present

## 2023-02-21 DIAGNOSIS — Z6841 Body Mass Index (BMI) 40.0 and over, adult: Secondary | ICD-10-CM | POA: Diagnosis not present

## 2023-02-25 DIAGNOSIS — M170 Bilateral primary osteoarthritis of knee: Secondary | ICD-10-CM | POA: Diagnosis not present

## 2023-02-28 ENCOUNTER — Other Ambulatory Visit (HOSPITAL_COMMUNITY): Payer: Self-pay

## 2023-03-02 ENCOUNTER — Other Ambulatory Visit (HOSPITAL_COMMUNITY): Payer: Self-pay

## 2023-03-02 MED ORDER — DICLOFENAC SODIUM 75 MG PO TBEC
75.0000 mg | DELAYED_RELEASE_TABLET | Freq: Two times a day (BID) | ORAL | 1 refills | Status: DC
  Filled 2023-03-02 – 2023-05-08 (×2): qty 60, 30d supply, fill #0
  Filled 2023-06-11: qty 60, 30d supply, fill #1

## 2023-03-02 MED ORDER — DICLOFENAC SODIUM 75 MG PO TBEC
75.0000 mg | DELAYED_RELEASE_TABLET | Freq: Two times a day (BID) | ORAL | 1 refills | Status: DC
  Filled 2023-03-02: qty 60, 30d supply, fill #0
  Filled 2023-04-03: qty 60, 30d supply, fill #1

## 2023-03-03 ENCOUNTER — Other Ambulatory Visit (HOSPITAL_COMMUNITY): Payer: Self-pay

## 2023-03-11 DIAGNOSIS — M170 Bilateral primary osteoarthritis of knee: Secondary | ICD-10-CM | POA: Diagnosis not present

## 2023-04-03 ENCOUNTER — Other Ambulatory Visit (HOSPITAL_COMMUNITY): Payer: Self-pay

## 2023-04-03 ENCOUNTER — Other Ambulatory Visit: Payer: Self-pay | Admitting: Internal Medicine

## 2023-04-03 MED ORDER — MONTELUKAST SODIUM 10 MG PO TABS
10.0000 mg | ORAL_TABLET | Freq: Every day | ORAL | 0 refills | Status: DC
  Filled 2023-04-03: qty 90, 90d supply, fill #0

## 2023-04-04 ENCOUNTER — Other Ambulatory Visit (HOSPITAL_COMMUNITY): Payer: Self-pay

## 2023-04-08 DIAGNOSIS — M47816 Spondylosis without myelopathy or radiculopathy, lumbar region: Secondary | ICD-10-CM | POA: Diagnosis not present

## 2023-04-18 ENCOUNTER — Other Ambulatory Visit (HOSPITAL_COMMUNITY): Payer: Self-pay

## 2023-04-22 DIAGNOSIS — M47816 Spondylosis without myelopathy or radiculopathy, lumbar region: Secondary | ICD-10-CM | POA: Diagnosis not present

## 2023-05-08 ENCOUNTER — Other Ambulatory Visit (HOSPITAL_COMMUNITY): Payer: Self-pay

## 2023-05-20 DIAGNOSIS — M47816 Spondylosis without myelopathy or radiculopathy, lumbar region: Secondary | ICD-10-CM | POA: Diagnosis not present

## 2023-06-04 ENCOUNTER — Other Ambulatory Visit (HOSPITAL_COMMUNITY): Payer: Self-pay

## 2023-06-11 ENCOUNTER — Other Ambulatory Visit (HOSPITAL_COMMUNITY): Payer: Self-pay

## 2023-07-08 ENCOUNTER — Other Ambulatory Visit (HOSPITAL_COMMUNITY): Payer: Self-pay

## 2023-07-08 ENCOUNTER — Other Ambulatory Visit: Payer: Self-pay | Admitting: Internal Medicine

## 2023-07-08 MED ORDER — NEBIVOLOL HCL 10 MG PO TABS
10.0000 mg | ORAL_TABLET | Freq: Every day | ORAL | 1 refills | Status: DC
  Filled 2023-07-08: qty 90, 90d supply, fill #0
  Filled 2023-10-27: qty 90, 90d supply, fill #1

## 2023-07-08 MED ORDER — MONTELUKAST SODIUM 10 MG PO TABS
10.0000 mg | ORAL_TABLET | Freq: Every day | ORAL | 0 refills | Status: DC
  Filled 2023-07-08 (×2): qty 90, 90d supply, fill #0

## 2023-07-08 MED ORDER — METFORMIN HCL 500 MG PO TABS
500.0000 mg | ORAL_TABLET | Freq: Two times a day (BID) | ORAL | 1 refills | Status: DC
  Filled 2023-07-08 (×2): qty 180, 90d supply, fill #0
  Filled 2023-10-27 – 2023-11-14 (×2): qty 180, 90d supply, fill #1

## 2023-07-09 ENCOUNTER — Other Ambulatory Visit (HOSPITAL_COMMUNITY): Payer: Self-pay

## 2023-07-09 MED ORDER — DICLOFENAC SODIUM 75 MG PO TBEC
75.0000 mg | DELAYED_RELEASE_TABLET | Freq: Two times a day (BID) | ORAL | 1 refills | Status: DC | PRN
  Filled 2023-07-09: qty 60, 30d supply, fill #0
  Filled 2023-08-18: qty 60, 30d supply, fill #1

## 2023-07-14 ENCOUNTER — Encounter: Payer: Self-pay | Admitting: Internal Medicine

## 2023-07-14 NOTE — Progress Notes (Unsigned)
Subjective:    Patient ID: Dawn Townsend, female    DOB: 1966-03-08, 57 y.o.   MRN: [id number]      HPI Dawn Townsend is here for No chief complaint on file.    Leg issues -     Medications and allergies reviewed with patient and updated if appropriate.  Current Outpatient Medications on File Prior to Visit  Medication Sig Dispense Refill   albuterol (VENTOLIN HFA) 108 (90 Base) MCG/ACT inhaler INHALE 1-2 PUFFS INTO THE LUNGS EVERY 6 (SIX) HOURS AS NEEDED. 18 g 5   Cholecalciferol (VITAMIN D PO) Take by mouth.     diclofenac (VOLTAREN) 75 MG EC tablet Take 1 tablet (75 mg total) by mouth 2 (two) times daily. 60 tablet 1   diclofenac (VOLTAREN) 75 MG EC tablet Take 1 tablet by mouth twice a day as needed 60 tablet 1   hydrochlorothiazide (HYDRODIURIL) 12.5 MG tablet Take 1 tablet (12.5 mg total) by mouth daily as needed (for leg swelling). 90 tablet 3   lansoprazole (PREVACID) 30 MG capsule Take 1 capsule (30 mg total) by mouth every morning. 90 capsule 1   metFORMIN (GLUCOPHAGE) 500 MG tablet Take 1 tablet (500 mg total) by mouth 2 (two) times daily with a meal. 180 tablet 1   montelukast (SINGULAIR) 10 MG tablet Take 1 tablet (10 mg total) by mouth at bedtime. 90 tablet 0   nebivolol (BYSTOLIC) 10 MG tablet Take 1 tablet (10 mg total) by mouth daily. 90 tablet 1   predniSONE (DELTASONE) 50 MG tablet Take 1 tablet (50 mg total) by mouth daily. 5 tablet 0   tiZANidine (ZANAFLEX) 4 MG tablet Take 1 tablet by mouth at bedtime as needed (Patient not taking: Reported on 01/14/2023) 30 tablet 1   valsartan (DIOVAN) 160 MG tablet Take 1 tablet (160 mg total) by mouth daily. 90 tablet 3   No current facility-administered medications on file prior to visit.    Review of Systems     Objective:  There were no vitals filed for this visit. BP Readings from Last 3 Encounters:  01/14/23 138/88  12/31/22 138/84  12/17/22 (!) 169/90   Wt Readings from Last 3 Encounters:  01/14/23 (!) 308 lb  (139.7 kg)  12/31/22 (!) 308 lb (139.7 kg)  05/15/22 296 lb (134.3 kg)   There is no height or weight on file to calculate BMI.    Physical Exam         Assessment & Plan:    See Problem List for Assessment and Plan of chronic medical problems.

## 2023-07-15 ENCOUNTER — Ambulatory Visit (INDEPENDENT_AMBULATORY_CARE_PROVIDER_SITE_OTHER): Payer: Commercial Managed Care - PPO

## 2023-07-15 ENCOUNTER — Other Ambulatory Visit (HOSPITAL_COMMUNITY): Payer: Self-pay

## 2023-07-15 ENCOUNTER — Ambulatory Visit: Payer: Commercial Managed Care - PPO | Admitting: Internal Medicine

## 2023-07-15 VITALS — BP 142/78 | HR 62 | Temp 98.0°F | Ht 70.0 in | Wt 309.0 lb

## 2023-07-15 DIAGNOSIS — G8929 Other chronic pain: Secondary | ICD-10-CM

## 2023-07-15 DIAGNOSIS — I10 Essential (primary) hypertension: Secondary | ICD-10-CM | POA: Diagnosis not present

## 2023-07-15 DIAGNOSIS — R0609 Other forms of dyspnea: Secondary | ICD-10-CM

## 2023-07-15 DIAGNOSIS — R5383 Other fatigue: Secondary | ICD-10-CM | POA: Insufficient documentation

## 2023-07-15 DIAGNOSIS — Z1211 Encounter for screening for malignant neoplasm of colon: Secondary | ICD-10-CM | POA: Diagnosis not present

## 2023-07-15 DIAGNOSIS — I700 Atherosclerosis of aorta: Secondary | ICD-10-CM | POA: Diagnosis not present

## 2023-07-15 DIAGNOSIS — M25561 Pain in right knee: Secondary | ICD-10-CM

## 2023-07-15 DIAGNOSIS — M25562 Pain in left knee: Secondary | ICD-10-CM | POA: Diagnosis not present

## 2023-07-15 DIAGNOSIS — R7303 Prediabetes: Secondary | ICD-10-CM

## 2023-07-15 DIAGNOSIS — R5382 Chronic fatigue, unspecified: Secondary | ICD-10-CM | POA: Diagnosis not present

## 2023-07-15 DIAGNOSIS — R600 Localized edema: Secondary | ICD-10-CM

## 2023-07-15 LAB — COMPREHENSIVE METABOLIC PANEL
ALT: 18 U/L (ref 0–35)
AST: 20 U/L (ref 0–37)
Albumin: 4 g/dL (ref 3.5–5.2)
Alkaline Phosphatase: 104 U/L (ref 39–117)
BUN: 21 mg/dL (ref 6–23)
CO2: 28 meq/L (ref 19–32)
Calcium: 9.6 mg/dL (ref 8.4–10.5)
Chloride: 102 meq/L (ref 96–112)
Creatinine, Ser: 0.69 mg/dL (ref 0.40–1.20)
GFR: 96.89 mL/min (ref >60.00)
Glucose, Bld: 87 mg/dL (ref 70–99)
Potassium: 4.5 meq/L (ref 3.5–5.1)
Sodium: 138 meq/L (ref 135–145)
Total Bilirubin: 0.5 mg/dL (ref 0.2–1.2)
Total Protein: 8.2 g/dL (ref 6.0–8.3)

## 2023-07-15 LAB — TSH: TSH: 2.74 u[IU]/mL (ref 0.35–5.50)

## 2023-07-15 MED ORDER — HYDROCHLOROTHIAZIDE 25 MG PO TABS
25.0000 mg | ORAL_TABLET | Freq: Every day | ORAL | 3 refills | Status: AC
  Filled 2023-07-15 – 2023-08-18 (×2): qty 90, 90d supply, fill #0
  Filled 2024-02-03: qty 90, 90d supply, fill #1
  Filled 2024-06-11: qty 90, 90d supply, fill #2

## 2023-07-15 NOTE — Patient Instructions (Addendum)
      Have blood work and a chest xray done downstairs.    Medications changes include :   increase hydrochlorothiazide to 25 mg daily.     An Echocardiogram was ordered and someone will call you to schedule an appointment.    Call gyn and schedule an appointment - [phone]   Return in about 3 weeks (around 08/05/2023) for follow up.

## 2023-07-15 NOTE — Assessment & Plan Note (Signed)
Chronic Lab Results  Component Value Date   HGBA1C 6.3 05/07/2022   Check a1c Low sugar / carb diet Stressed regular exercise Has not been taking metformin for a while-it was making her sick

## 2023-07-15 NOTE — Assessment & Plan Note (Signed)
Chronic She has been experiencing shortness of breath for a while-over a year She has had a few episodes of acute shortness of breath that has lasted for several minutes that is very concerning to her No obvious cause Did go to the ED over 1 year ago for the same workup was negative at that time Chest x-ray today Echocardiogram CBC, CMP, TSH Obesity and deconditioning likely contributing Further evaluation depending on above results

## 2023-07-15 NOTE — Assessment & Plan Note (Signed)
Chronic BMI 44.34 kg/m-weight 309 pounds Stressed weight loss She needs to lose weight in order to have her knee replaced Discussed this would also improve her shortness of breath, fatigue and back pain Limited in exercise because of back pain and knee pain Would benefit from GLP-1 inhibitor-will check A1c to see if she is a diabetic or still a prediabetic

## 2023-07-15 NOTE — Assessment & Plan Note (Signed)
Chronic Not ideally controlled Increase hydrochlorothiazide to 25 mg daily Continue Bystolic 10 mg daily and Diovan 160 mg daily CBC, CMP

## 2023-07-15 NOTE — Assessment & Plan Note (Signed)
Chronic Left leg > right leg No major changes in swelling acutely Ultrasound of left leg in the past year negative for clot Left leg worse than left leg likely secondary to knee osteoarthritis Her weight, being sedentary and osteoarthritis is likely contributing I doubt blood clots and she has had an ultrasound in the past year to rule it out and her swelling has not changed Will check echocardiogram Increase hydrochlorothiazide to 25 mg daily

## 2023-07-15 NOTE — Assessment & Plan Note (Signed)
Chronic Likely multifactorial Her sleep is not great-has interrupted sleep and often does not get more than 6 hours She is unsure if she snores-?  Sleep apnea-will reevaluate possibly getting tested at her next visit Check CBC, CMP, TSH Echocardiogram to evaluate dyspnea Follow-up in 3 weeks

## 2023-07-15 NOTE — Assessment & Plan Note (Signed)
Chronic Bilateral osteoarthritis-severe Following with orthopedics Taking diclofenac 75 mg twice daily

## 2023-07-16 ENCOUNTER — Other Ambulatory Visit: Payer: Self-pay | Admitting: Internal Medicine

## 2023-07-16 DIAGNOSIS — R0609 Other forms of dyspnea: Secondary | ICD-10-CM

## 2023-07-16 DIAGNOSIS — R5382 Chronic fatigue, unspecified: Secondary | ICD-10-CM

## 2023-07-16 DIAGNOSIS — R7303 Prediabetes: Secondary | ICD-10-CM

## 2023-07-25 ENCOUNTER — Other Ambulatory Visit (HOSPITAL_COMMUNITY): Payer: Self-pay

## 2023-08-08 ENCOUNTER — Other Ambulatory Visit (HOSPITAL_COMMUNITY): Payer: Commercial Managed Care - PPO

## 2023-08-12 ENCOUNTER — Ambulatory Visit: Payer: Commercial Managed Care - PPO | Admitting: Internal Medicine

## 2023-08-18 ENCOUNTER — Other Ambulatory Visit (HOSPITAL_COMMUNITY): Payer: Self-pay

## 2023-08-18 MED ORDER — HYDROCODONE-ACETAMINOPHEN 5-325 MG PO TABS
1.0000 | ORAL_TABLET | Freq: Two times a day (BID) | ORAL | 0 refills | Status: DC | PRN
  Filled 2023-08-18: qty 10, 5d supply, fill #0

## 2023-09-03 DIAGNOSIS — M170 Bilateral primary osteoarthritis of knee: Secondary | ICD-10-CM | POA: Diagnosis not present

## 2023-09-03 DIAGNOSIS — M7671 Peroneal tendinitis, right leg: Secondary | ICD-10-CM | POA: Diagnosis not present

## 2023-09-24 ENCOUNTER — Other Ambulatory Visit (HOSPITAL_COMMUNITY): Payer: Self-pay

## 2023-09-25 ENCOUNTER — Other Ambulatory Visit (HOSPITAL_COMMUNITY): Payer: Self-pay

## 2023-09-25 MED ORDER — DICLOFENAC SODIUM 75 MG PO TBEC
75.0000 mg | DELAYED_RELEASE_TABLET | Freq: Two times a day (BID) | ORAL | 1 refills | Status: DC | PRN
  Filled 2023-09-25: qty 60, 30d supply, fill #0
  Filled 2023-10-27: qty 60, 30d supply, fill #1

## 2023-10-27 ENCOUNTER — Other Ambulatory Visit (HOSPITAL_COMMUNITY): Payer: Self-pay

## 2023-10-27 ENCOUNTER — Other Ambulatory Visit: Payer: Self-pay | Admitting: Internal Medicine

## 2023-10-27 MED ORDER — MONTELUKAST SODIUM 10 MG PO TABS
10.0000 mg | ORAL_TABLET | Freq: Every day | ORAL | 0 refills | Status: DC
  Filled 2023-10-27: qty 90, 90d supply, fill #0

## 2023-10-27 MED ORDER — LANSOPRAZOLE 30 MG PO CPDR
30.0000 mg | DELAYED_RELEASE_CAPSULE | Freq: Every morning | ORAL | 1 refills | Status: DC
  Filled 2023-10-27: qty 90, 90d supply, fill #0
  Filled 2024-02-03: qty 90, 90d supply, fill #1

## 2023-10-28 ENCOUNTER — Other Ambulatory Visit (HOSPITAL_COMMUNITY): Payer: Self-pay

## 2023-10-30 ENCOUNTER — Other Ambulatory Visit (HOSPITAL_COMMUNITY): Payer: Self-pay

## 2023-11-14 ENCOUNTER — Other Ambulatory Visit: Payer: Self-pay

## 2023-11-14 ENCOUNTER — Other Ambulatory Visit (HOSPITAL_COMMUNITY): Payer: Self-pay

## 2023-11-28 ENCOUNTER — Other Ambulatory Visit (HOSPITAL_COMMUNITY): Payer: Self-pay

## 2023-11-30 MED ORDER — DICLOFENAC SODIUM 75 MG PO TBEC
75.0000 mg | DELAYED_RELEASE_TABLET | Freq: Two times a day (BID) | ORAL | 1 refills | Status: DC | PRN
  Filled 2023-11-30: qty 60, 30d supply, fill #0
  Filled 2024-01-06: qty 20, 10d supply, fill #1
  Filled 2024-01-06: qty 40, 20d supply, fill #1

## 2023-12-01 ENCOUNTER — Other Ambulatory Visit (HOSPITAL_COMMUNITY): Payer: Self-pay

## 2023-12-11 DIAGNOSIS — M170 Bilateral primary osteoarthritis of knee: Secondary | ICD-10-CM | POA: Diagnosis not present

## 2023-12-11 DIAGNOSIS — S83241A Other tear of medial meniscus, current injury, right knee, initial encounter: Secondary | ICD-10-CM | POA: Diagnosis not present

## 2023-12-12 ENCOUNTER — Ambulatory Visit: Admitting: Internal Medicine

## 2023-12-15 NOTE — Progress Notes (Unsigned)
    Subjective:    Patient ID: Dawn Townsend, female    DOB: [birth year], 58 y.o.   MRN: [id number]      HPI Dawn Townsend is here for No chief complaint on file.        Medications and allergies reviewed with patient and updated if appropriate.  Current Outpatient Medications on File Prior to Visit  Medication Sig Dispense Refill   albuterol (VENTOLIN HFA) 108 (90 Base) MCG/ACT inhaler INHALE 1-2 PUFFS INTO THE LUNGS EVERY 6 (SIX) HOURS AS NEEDED. 18 g 5   Cholecalciferol (VITAMIN D PO) Take by mouth.     diclofenac (VOLTAREN) 75 MG EC tablet Take 1 tablet (75 mg total) by mouth 2 (two) times daily. 60 tablet 1   diclofenac (VOLTAREN) 75 MG EC tablet Take 1 tablet (75 mg total) by mouth 2 (two) times daily as needed. 60 tablet 1   hydrochlorothiazide (HYDRODIURIL) 25 MG tablet Take 1 tablet (25 mg total) by mouth daily. 90 tablet 3   HYDROcodone-acetaminophen (NORCO/VICODIN) 5-325 MG tablet Take 1 tablet by mouth twice a day as needed 10 tablet 0   lansoprazole (PREVACID) 30 MG capsule Take 1 capsule (30 mg total) by mouth every morning. 90 capsule 1   metFORMIN (GLUCOPHAGE) 500 MG tablet Take 1 tablet (500 mg total) by mouth 2 (two) times daily with a meal. 180 tablet 1   montelukast (SINGULAIR) 10 MG tablet Take 1 tablet (10 mg total) by mouth at bedtime. 90 tablet 0   nebivolol (BYSTOLIC) 10 MG tablet Take 1 tablet (10 mg total) by mouth daily. 90 tablet 1   tiZANidine (ZANAFLEX) 4 MG tablet Take 1 tablet by mouth at bedtime as needed 30 tablet 1   valsartan (DIOVAN) 160 MG tablet Take 1 tablet (160 mg total) by mouth daily. 90 tablet 3   No current facility-administered medications on file prior to visit.    Review of Systems     Objective:  There were no vitals filed for this visit. BP Readings from Last 3 Encounters:  07/15/23 (!) 142/78  01/14/23 138/88  12/31/22 138/84   Wt Readings from Last 3 Encounters:  07/15/23 (!) 309 lb (140.2 kg)  01/14/23 (!) 308 lb (139.7 kg)   12/31/22 (!) 308 lb (139.7 kg)   There is no height or weight on file to calculate BMI.    Physical Exam         Assessment & Plan:    See Problem List for Assessment and Plan of chronic medical problems.

## 2023-12-16 ENCOUNTER — Encounter: Payer: Self-pay | Admitting: Internal Medicine

## 2023-12-16 ENCOUNTER — Other Ambulatory Visit (HOSPITAL_COMMUNITY): Payer: Self-pay

## 2023-12-16 ENCOUNTER — Ambulatory Visit: Admitting: Internal Medicine

## 2023-12-16 DIAGNOSIS — R7303 Prediabetes: Secondary | ICD-10-CM | POA: Diagnosis not present

## 2023-12-16 DIAGNOSIS — K219 Gastro-esophageal reflux disease without esophagitis: Secondary | ICD-10-CM

## 2023-12-16 DIAGNOSIS — J45909 Unspecified asthma, uncomplicated: Secondary | ICD-10-CM | POA: Diagnosis not present

## 2023-12-16 DIAGNOSIS — I10 Essential (primary) hypertension: Secondary | ICD-10-CM

## 2023-12-16 MED ORDER — VALSARTAN 320 MG PO TABS
320.0000 mg | ORAL_TABLET | Freq: Every day | ORAL | 3 refills | Status: AC
  Filled 2023-12-16 – 2023-12-30 (×2): qty 90, 90d supply, fill #0
  Filled 2024-03-30: qty 90, 90d supply, fill #1
  Filled 2024-07-09: qty 90, 90d supply, fill #2
  Filled 2024-10-20 (×2): qty 90, 90d supply, fill #0

## 2023-12-16 NOTE — Assessment & Plan Note (Signed)
 Chronic She is not eating how she should be eating-advised stopping drinking juice and soda Stressed that she cannot be eating fast food Advised diet high in protein and vegetables Advised diet low in carbohydrates and sugars Can refer to nutrition if she wishes Follow-up in 12 weeks

## 2023-12-16 NOTE — Assessment & Plan Note (Signed)
Chronic Mild, intermittent Controlled Singular 10 mg daily Continue albuterol inhaler as needed 

## 2023-12-16 NOTE — Patient Instructions (Addendum)
        Medications changes include :   stop the metformin.  Increase valsartan to 320 mg daily     Return in about 12 weeks (around 03/09/2024) for follow up.

## 2023-12-16 NOTE — Assessment & Plan Note (Signed)
Chronic GERD controlled Continue lansoprazole 30 mg daily  

## 2023-12-16 NOTE — Assessment & Plan Note (Signed)
 Chronic Not ideally controlled Continue hydrochlorothiazide to 25 mg daily, Bystolic 10 mg daily Increase valsartan to 320 mg daily Change diet-work on weight loss CMP in a few weeks

## 2023-12-16 NOTE — Assessment & Plan Note (Signed)
 Chronic Lab Results  Component Value Date   HGBA1C 6.3 05/07/2022   Check a1c at her next visit Low sugar / carb diet Stressed regular exercise, weight loss Stop metformin-has been having some increased bowel movements which may be related to metformin Will recheck A1c at her next visit and decide if medication is needed

## 2023-12-23 ENCOUNTER — Emergency Department (HOSPITAL_COMMUNITY)

## 2023-12-23 ENCOUNTER — Encounter (HOSPITAL_COMMUNITY): Payer: Self-pay | Admitting: *Deleted

## 2023-12-23 ENCOUNTER — Other Ambulatory Visit: Payer: Self-pay

## 2023-12-23 ENCOUNTER — Emergency Department (HOSPITAL_COMMUNITY)
Admission: EM | Admit: 2023-12-23 | Discharge: 2023-12-23 | Disposition: A | Discharge status: Home / Self Care | Attending: Emergency Medicine | Admitting: Emergency Medicine

## 2023-12-23 DIAGNOSIS — R0602 Shortness of breath: Secondary | ICD-10-CM

## 2023-12-23 DIAGNOSIS — J45909 Unspecified asthma, uncomplicated: Secondary | ICD-10-CM | POA: Diagnosis not present

## 2023-12-23 DIAGNOSIS — E860 Dehydration: Secondary | ICD-10-CM

## 2023-12-23 DIAGNOSIS — Z79899 Other long term (current) drug therapy: Secondary | ICD-10-CM | POA: Diagnosis not present

## 2023-12-23 DIAGNOSIS — I10 Essential (primary) hypertension: Secondary | ICD-10-CM | POA: Insufficient documentation

## 2023-12-23 DIAGNOSIS — R0789 Other chest pain: Secondary | ICD-10-CM | POA: Diagnosis not present

## 2023-12-23 LAB — RESP PANEL BY RT-PCR (RSV, FLU A&B, COVID)  RVPGX2
Influenza A by PCR: NEGATIVE
Influenza B by PCR: NEGATIVE
Resp Syncytial Virus by PCR: NEGATIVE
SARS Coronavirus 2 by RT PCR: NEGATIVE

## 2023-12-23 LAB — CBC
HCT: 44.3 % (ref 36.0–46.0)
Hemoglobin: 14 g/dL (ref 12.0–15.0)
MCH: 26.8 pg (ref 26.0–34.0)
MCHC: 31.6 g/dL (ref 30.0–36.0)
MCV: 84.7 fL (ref 80.0–100.0)
Platelets: 262 10*3/uL (ref 150–400)
RBC: 5.23 MIL/uL — ABNORMAL HIGH (ref 3.87–5.11)
RDW: 15.3 % (ref 11.5–15.5)
WBC: 7.5 10*3/uL (ref 4.0–10.5)
nRBC: 0 % (ref 0.0–0.2)

## 2023-12-23 LAB — TROPONIN I (HIGH SENSITIVITY)
Troponin I (High Sensitivity): 5 ng/L (ref <18)
Troponin I (High Sensitivity): 7 ng/L (ref <18)

## 2023-12-23 LAB — URINALYSIS, W/ REFLEX TO CULTURE (INFECTION SUSPECTED)
Bilirubin Urine: NEGATIVE
Glucose, UA: NEGATIVE mg/dL
Hgb urine dipstick: NEGATIVE
Ketones, ur: NEGATIVE mg/dL
Leukocytes,Ua: NEGATIVE
Nitrite: NEGATIVE
Protein, ur: 30 mg/dL — AB
Specific Gravity, Urine: 1.016 (ref 1.005–1.030)
pH: 5 (ref 5.0–8.0)

## 2023-12-23 LAB — BASIC METABOLIC PANEL WITH GFR
Anion gap: 13 (ref 5–15)
BUN: 23 mg/dL — ABNORMAL HIGH (ref 6–20)
CO2: 25 mmol/L (ref 22–32)
Calcium: 9.4 mg/dL (ref 8.9–10.3)
Chloride: 102 mmol/L (ref 98–111)
Creatinine, Ser: 1.37 mg/dL — ABNORMAL HIGH (ref 0.44–1.00)
GFR, Estimated: 45 mL/min — ABNORMAL LOW (ref >60)
Glucose, Bld: 89 mg/dL (ref 70–99)
Potassium: 3.8 mmol/L (ref 3.5–5.1)
Sodium: 140 mmol/L (ref 135–145)

## 2023-12-23 MED ORDER — SODIUM CHLORIDE 0.9 % IV BOLUS
1000.0000 mL | Freq: Once | INTRAVENOUS | Status: AC
  Administered 2023-12-23: 1000 mL via INTRAVENOUS

## 2023-12-23 MED ORDER — IPRATROPIUM-ALBUTEROL 0.5-2.5 (3) MG/3ML IN SOLN
3.0000 mL | Freq: Once | RESPIRATORY_TRACT | Status: AC
  Administered 2023-12-23: 3 mL via RESPIRATORY_TRACT
  Filled 2023-12-23: qty 3

## 2023-12-23 NOTE — Discharge Instructions (Signed)
 It was a pleasure caring for you today. As discussed, please follow up with your primary care provider later this week for repeat labs. Seek emergency care if experiencing any new or worsening symptoms.

## 2023-12-23 NOTE — ED Provider Notes (Cosign Needed Addendum)
 [place] [facility]ERGENCY DEPARTMENT AT [facility] Provider Note   CSN: [id number] Arrival date & time: 12/23/23  1115     History  Chief Complaint  Patient presents with   Shortness of Breath   Chest Pain    Dawn Townsend is a 58 y.o. female with PMHx OA, asthma, GERD, HLD, HTN, eczema who presents to ED concerned for DOE, lightheadedness, and mild central chest pain that started this morning. Symptoms resolve with rest/sitting. Patient stating that her symptoms do not feel as severe as asthma. Patient wondering if symptoms are d/t dehydration and stating that she has barely been drinking any water recently.  Patient later stating that she has intermittent dysuria as well.  Denies fever, cough, nausea, vomiting, diarrhea, hematuria, hematochezia. Denies recent surgery/immobilization, hx DVT/PE, hemoptysis, hx cancer in the past 6 months, recent calf swelling/tenderness.     Shortness of Breath Associated symptoms: chest pain   Chest Pain Associated symptoms: shortness of breath        Home Medications Prior to Admission medications   Medication Sig Start Date End Date Taking? Authorizing Provider  albuterol (VENTOLIN HFA) 108 (90 Base) MCG/ACT inhaler INHALE 1-2 PUFFS INTO THE LUNGS EVERY 6 (SIX) HOURS AS NEEDED. 09/22/20 09/22/21  Pincus Sanes, MD  Cholecalciferol (VITAMIN D PO) Take by mouth.    [provider]  diclofenac (VOLTAREN) 75 MG EC tablet Take 1 tablet (75 mg total) by mouth 2 (two) times daily as needed. 11/30/23     hydrochlorothiazide (HYDRODIURIL) 25 MG tablet Take 1 tablet (25 mg total) by mouth daily. 07/15/23   Pincus Sanes, MD  HYDROcodone-acetaminophen (NORCO/VICODIN) 5-325 MG tablet Take 1 tablet by mouth twice a day as needed 08/18/23     lansoprazole (PREVACID) 30 MG capsule Take 1 capsule (30 mg total) by mouth every morning. 10/27/23   Pincus Sanes, MD  montelukast (SINGULAIR) 10 MG tablet Take 1 tablet (10 mg total) by mouth at  bedtime. 10/27/23   Pincus Sanes, MD  nebivolol (BYSTOLIC) 10 MG tablet Take 1 tablet (10 mg total) by mouth daily. 07/08/23   Pincus Sanes, MD  tiZANidine (ZANAFLEX) 4 MG tablet Take 1 tablet by mouth at bedtime as needed 07/03/22     valsartan (DIOVAN) 320 MG tablet Take 1 tablet (320 mg total) by mouth daily. 12/16/23   Pincus Sanes, MD      Allergies    Patient has no known allergies.    Review of Systems   Review of Systems  Respiratory:  Positive for shortness of breath.   Cardiovascular:  Positive for chest pain.    Physical Exam Updated Vital Signs BP (!) 106/51   Pulse 83   Temp 97.8 F (36.6 C)   Resp 14   LMP 06/02/2014   SpO2 100%  Physical Exam Vitals and nursing note reviewed.  Constitutional:      General: She is not in acute distress.    Appearance: She is not ill-appearing or toxic-appearing.  HENT:     Head: Normocephalic and atraumatic.     Mouth/Throat:     Mouth: Mucous membranes are moist.     Pharynx: No posterior oropharyngeal erythema.  Eyes:     General: No scleral icterus.       Right eye: No discharge.        Left eye: No discharge.     Conjunctiva/sclera: Conjunctivae normal.  Cardiovascular:     Rate and Rhythm: Normal rate  and regular rhythm.     Pulses: Normal pulses.     Heart sounds: Normal heart sounds. No murmur heard. Pulmonary:     Effort: Pulmonary effort is normal. No respiratory distress.     Breath sounds: Normal breath sounds. No wheezing, rhonchi or rales.  Abdominal:     General: Bowel sounds are normal.     Palpations: Abdomen is soft.  Musculoskeletal:     Right lower leg: No edema.     Left lower leg: No edema.  Skin:    General: Skin is warm and dry.     Findings: No rash.  Neurological:     General: No focal deficit present.     Mental Status: She is alert and oriented to person, place, and time. Mental status is at baseline.  Psychiatric:        Mood and Affect: Mood normal.     ED Results /  Procedures / Treatments   Labs (all labs ordered are listed, but only abnormal results are displayed) Labs Reviewed  BASIC METABOLIC PANEL WITH GFR - Abnormal; Notable for the following components:      Result Value   BUN 23 (*)    Creatinine, Ser 1.37 (*)    GFR, Estimated 45 (*)    All other components within normal limits  CBC - Abnormal; Notable for the following components:   RBC 5.23 (*)    All other components within normal limits  URINALYSIS, W/ REFLEX TO CULTURE (INFECTION SUSPECTED) - Abnormal; Notable for the following components:   APPearance HAZY (*)    Protein, ur 30 (*)    Bacteria, UA RARE (*)    All other components within normal limits  RESP PANEL BY RT-PCR (RSV, FLU A&B, COVID)  RVPGX2  TROPONIN I (HIGH SENSITIVITY)  TROPONIN I (HIGH SENSITIVITY)    EKG EKG Interpretation Date/Time:  Tuesday December 23 2023 11:22:52 EDT Ventricular Rate:  90 PR Interval:  150 QRS Duration:  78 QT Interval:  360 QTC Calculation: 440 R Axis:   -30  Text Interpretation: Normal sinus rhythm Left axis deviation Moderate voltage criteria for LVH, may be normal variant ( R in aVL , Cornell product ) Abnormal ECG Confirmed by Gerhard Munch [phone]) on 12/23/2023 2:15:38 PM  Radiology DG Chest 2 View Result Date: 12/23/2023 CLINICAL DATA:  Shortness of breath EXAM: CHEST - 2 VIEW COMPARISON:  X-ray 07/15/2023 FINDINGS: No consolidation, pneumothorax or effusion. No edema. Normal cardiopericardial silhouette. Mild degenerative changes along the spine. Surgical clips in the right upper quadrant of the abdomen. IMPRESSION: No acute cardiopulmonary disease. Electronically Signed   By: Karen Kays M.D.   On: 12/23/2023 12:56    Procedures Procedures    Medications Ordered in ED Medications  ipratropium-albuterol (DUONEB) 0.5-2.5 (3) MG/3ML nebulizer solution 3 mL (3 mLs Nebulization Given 12/23/23 1303)  sodium chloride 0.9 % bolus 1,000 mL (1,000 mLs Intravenous New Bag/Given 12/23/23  1506)    ED Course/ Medical Decision Making/ A&P                                 Medical Decision Making Amount and/or Complexity of Data Reviewed Labs: ordered. Radiology: ordered.  Risk Prescription drug management.   This patient presents to the ED for concern of shortness of breath, this involves an extensive number of treatment options, and is a complaint that carries with it a high risk of complications and morbidity.  The differential diagnosis includes Anxiety, Anaphylaxis/Angioedema, Aspirated FB, Arrhythmia, CHF, Asthma, COPD, PNA, COVID/Flu/RSV, STEMI, Tamponade, TPNX, Sepsis, AKI   Co morbidities that complicate the patient evaluation  OA, asthma, GERD, HLD, HTN, eczema    Additional history obtained:  Dr. Lawerance Bach PCP   Problem List / ED Course / Critical interventions / Medication management  Patient presents to ED concerned for DOE, mild chest pain, and lightheadedness since this morning. Wondering if it is d/t dehydration. Patient also with intermittent dysuria, but denies any other infectious symptoms today. Physical exam reassuring. Patient afebrile with stable vitals.  I Ordered, and personally interpreted labs.  CBC without leukocytosis or anemia.  Respiratory panel negative.  Initial and repeat troponin within normal limits.  BMP with mild elevation in BUN/Cr at 23/1.37.  UA not concerning for infection. The patient was maintained on a cardiac monitor.  I personally viewed and interpreted the cardiac monitored which showed an underlying rhythm of: Sinus rhythm I ordered imaging studies including chest xray to assess for process contributing to patient's symptoms. I independently visualized and interpreted imaging which showed no acute cardiopulmonary disease. I agree with the radiologist interpretation. Shared decision making with patient who wanted to withhold d-dimer/CTA chest today because her symptoms were not very severe and she does not have time to wait for  these results. Provided patient with IV fluids for her mild AKI. Patient stating that she feels better and would like to go home. Patient believes that her symptoms were d/t dehydration. I stressed to patient about the importance of drinking plenty of water to prevent further kidney damage. Patient tolerating PO intake in ED. Patient agrees to follow up with PCP this week for repeat labs. Patients symptoms may have also been a mild asthma exacerbation since she felt better after IV fluids and 1 breathing treatment. I have reviewed the patients home medicines and have made adjustments as needed Patient was given return precautions. Patient stable for discharge at this time. Patient verbalized understanding of plan.  DDx: These are considered less likely due to history of present illness and physical exam findings -Aspirated FB: no history of choking -Arrhythmia/STEMI: EKG/trop reassuring -Asthma/COPD/PNA: Lungs clear to auscultation bilaterally and O2 sat 98% on RA -Tamponade: physical exam and chest xray reassuring -CHF: no physical exam findings -TPNX: Lungs clear to auscultation bilaterally -Sepsis: afebrile and other vital signs stable   Social Determinants of Health:  none          Final Clinical Impression(s) / ED Diagnoses Final diagnoses:  Dehydration  Shortness of breath  Atypical chest pain    Rx / DC Orders ED Discharge Orders     None          Margarita Rana 12/23/23 1549    Gerhard Munch, MD 12/24/23 [phone]

## 2023-12-23 NOTE — ED Triage Notes (Signed)
 Pt states that she feels like she can't catch her breath when she is standing.  Some CP with this.  This began today

## 2023-12-26 ENCOUNTER — Other Ambulatory Visit (HOSPITAL_COMMUNITY): Payer: Self-pay

## 2023-12-30 ENCOUNTER — Other Ambulatory Visit (HOSPITAL_COMMUNITY): Payer: Self-pay

## 2024-01-04 DIAGNOSIS — N179 Acute kidney failure, unspecified: Secondary | ICD-10-CM | POA: Insufficient documentation

## 2024-01-04 NOTE — Patient Instructions (Addendum)
      Blood work was ordered.       Medications changes include :   trazodone  50 mg nightly for sleep      Return for follow up as scheduled.

## 2024-01-04 NOTE — Progress Notes (Signed)
 Subjective:    Patient ID: Dawn Townsend, female    DOB: 23-Jul-1966, 58 y.o.   MRN: [id number]     HPI Dawn Townsend is here for follow up from the hospital  ED 12/23/23 for SOB, chest pain and lightheadedness  Her symptoms started that morning.  Symptoms resolved with sitting/rest. Did not feel like asthma.  She wondered if they were related to dehydration - she had barely been drinking any water.  Stated intermittent dysuria.  Denied other symptoms.   BMP showed dehydration, AKI.  EKG, telemetry, cxr, cbc, troponin, resp panel, ua noraml.  She received fluids, neb treatment.   She is drinking two 16 oz bottles of water.    Fatigued - going to bed with a headache.  Does not eat any salt.  Feels like something is wrong.  Increased SOB.  Getting harder to do anything.    Sleep - 4-5 hrs - will wake up and can not get back to sleep.    Medications and allergies reviewed with patient and updated if appropriate.  Current Outpatient Medications on File Prior to Visit  Medication Sig Dispense Refill   Cholecalciferol (VITAMIN D PO) Take by mouth.     diclofenac  (VOLTAREN ) 75 MG EC tablet Take 1 tablet (75 mg total) by mouth 2 (two) times daily as needed. 60 tablet 1   hydrochlorothiazide  (HYDRODIURIL ) 25 MG tablet Take 1 tablet (25 mg total) by mouth daily. 90 tablet 3   lansoprazole  (PREVACID ) 30 MG capsule Take 1 capsule (30 mg total) by mouth every morning. 90 capsule 1   montelukast  (SINGULAIR ) 10 MG tablet Take 1 tablet (10 mg total) by mouth at bedtime. 90 tablet 0   nebivolol  (BYSTOLIC ) 10 MG tablet Take 1 tablet (10 mg total) by mouth daily. 90 tablet 1   tiZANidine  (ZANAFLEX ) 4 MG tablet Take 1 tablet by mouth at bedtime as needed 30 tablet 1   valsartan  (DIOVAN ) 320 MG tablet Take 1 tablet (320 mg total) by mouth daily. 90 tablet 3   albuterol  (VENTOLIN  HFA) 108 (90 Base) MCG/ACT inhaler INHALE 1-2 PUFFS INTO THE LUNGS EVERY 6 (SIX) HOURS AS NEEDED. 18 g 5   No current  facility-administered medications on file prior to visit.     Review of Systems  Constitutional:  Positive for fatigue.  Respiratory:  Positive for shortness of breath. Negative for cough and wheezing.   Cardiovascular:  Positive for palpitations (occ). Negative for chest pain and leg swelling.  Neurological:  Positive for light-headedness (a little) and headaches.       Objective:   Vitals:   01/09/24 1053  BP: 136/88  Pulse: 65  Temp: 98.8 F (37.1 C)  SpO2: 99%   BP Readings from Last 3 Encounters:  01/09/24 136/88  12/23/23 127/86  12/16/23 (!) 140/78   Wt Readings from Last 3 Encounters:  01/09/24 (!) 305 lb (138.3 kg)  12/16/23 (!) 308 lb (139.7 kg)  07/15/23 (!) 309 lb (140.2 kg)   Body mass index is 43.76 kg/m.    Physical Exam Constitutional:      General: She is not in acute distress.    Appearance: Normal appearance.  HENT:     Head: Normocephalic and atraumatic.  Eyes:     Conjunctiva/sclera: Conjunctivae normal.  Cardiovascular:     Rate and Rhythm: Normal rate and regular rhythm.     Heart sounds: Normal heart sounds.  Pulmonary:     Effort: Pulmonary effort is normal.  No respiratory distress.     Breath sounds: Normal breath sounds. No wheezing.  Musculoskeletal:     Cervical back: Neck supple.     Right lower leg: No edema.     Left lower leg: No edema.  Lymphadenopathy:     Cervical: No cervical adenopathy.  Skin:    General: Skin is warm and dry.     Findings: No rash.  Neurological:     Mental Status: She is alert. Mental status is at baseline.  Psychiatric:        Mood and Affect: Mood normal.        Behavior: Behavior normal.        Lab Results  Component Value Date   WBC 7.5 12/23/2023   HGB 14.0 12/23/2023   HCT 44.3 12/23/2023   PLT 262 12/23/2023   GLUCOSE 89 12/23/2023   CHOL 214 (H) 05/07/2022   TRIG 98.0 05/07/2022   HDL 58.10 05/07/2022   LDLCALC 136 (H) 05/07/2022   ALT 18 07/15/2023   AST 20 07/15/2023    NA 140 12/23/2023   K 3.8 12/23/2023   CL 102 12/23/2023   CREATININE 1.37 (H) 12/23/2023   BUN 23 (H) 12/23/2023   CO2 25 12/23/2023   TSH 2.74 07/15/2023   HGBA1C 6.3 05/07/2022   DG Chest 2 View CLINICAL DATA:  Shortness of breath  EXAM: CHEST - 2 VIEW  COMPARISON:  X-ray 07/15/2023  FINDINGS: No consolidation, pneumothorax or effusion. No edema. Normal cardiopericardial silhouette. Mild degenerative changes along the spine. Surgical clips in the right upper quadrant of the abdomen.  IMPRESSION: No acute cardiopulmonary disease.  Electronically Signed   By: Adrianna Horde M.D.   On: 12/23/2023 12:56    Assessment & Plan:    See Problem List for Assessment and Plan of chronic medical problems.

## 2024-01-06 ENCOUNTER — Other Ambulatory Visit (HOSPITAL_COMMUNITY): Payer: Self-pay

## 2024-01-09 ENCOUNTER — Ambulatory Visit: Admitting: Internal Medicine

## 2024-01-09 ENCOUNTER — Other Ambulatory Visit (HOSPITAL_COMMUNITY): Payer: Self-pay

## 2024-01-09 ENCOUNTER — Encounter: Payer: Self-pay | Admitting: Internal Medicine

## 2024-01-09 VITALS — BP 136/88 | HR 65 | Temp 98.8°F | Ht 70.0 in | Wt 305.0 lb

## 2024-01-09 DIAGNOSIS — I10 Essential (primary) hypertension: Secondary | ICD-10-CM

## 2024-01-09 DIAGNOSIS — R5382 Chronic fatigue, unspecified: Secondary | ICD-10-CM

## 2024-01-09 DIAGNOSIS — R0609 Other forms of dyspnea: Secondary | ICD-10-CM

## 2024-01-09 DIAGNOSIS — R5383 Other fatigue: Secondary | ICD-10-CM

## 2024-01-09 DIAGNOSIS — N179 Acute kidney failure, unspecified: Secondary | ICD-10-CM | POA: Diagnosis not present

## 2024-01-09 DIAGNOSIS — R7303 Prediabetes: Secondary | ICD-10-CM | POA: Diagnosis not present

## 2024-01-09 LAB — CBC WITH DIFFERENTIAL/PLATELET
Basophils Absolute: 0.1 10*3/uL (ref 0.0–0.1)
Basophils Relative: 1.5 % (ref 0.0–3.0)
Eosinophils Absolute: 0.1 10*3/uL (ref 0.0–0.7)
Eosinophils Relative: 2.2 % (ref 0.0–5.0)
HCT: 39.8 % (ref 36.0–46.0)
Hemoglobin: 12.8 g/dL (ref 12.0–15.0)
Lymphocytes Relative: 19.2 % (ref 12.0–46.0)
Lymphs Abs: 1.3 10*3/uL (ref 0.7–4.0)
MCHC: 32.1 g/dL (ref 30.0–36.0)
MCV: 83.9 fl (ref 78.0–100.0)
Monocytes Absolute: 0.7 10*3/uL (ref 0.1–1.0)
Monocytes Relative: 10.5 % (ref 3.0–12.0)
Neutro Abs: 4.4 10*3/uL (ref 1.4–7.7)
Neutrophils Relative %: 66.6 % (ref 43.0–77.0)
Platelets: 281 10*3/uL (ref 150.0–400.0)
RBC: 4.75 Mil/uL (ref 3.87–5.11)
RDW: 15.5 % (ref 11.5–15.5)
WBC: 6.6 10*3/uL (ref 4.0–10.5)

## 2024-01-09 LAB — BASIC METABOLIC PANEL WITH GFR
BUN: 30 mg/dL — ABNORMAL HIGH (ref 6–23)
CO2: 30 meq/L (ref 19–32)
Calcium: 9.4 mg/dL (ref 8.4–10.5)
Chloride: 103 meq/L (ref 96–112)
Creatinine, Ser: 0.75 mg/dL (ref 0.40–1.20)
GFR: 88.58 mL/min (ref >60.00)
Glucose, Bld: 84 mg/dL (ref 70–99)
Potassium: 4 meq/L (ref 3.5–5.1)
Sodium: 142 meq/L (ref 135–145)

## 2024-01-09 LAB — HEMOGLOBIN A1C: Hgb A1c MFr Bld: 6.1 % (ref 4.6–6.5)

## 2024-01-09 LAB — TSH: TSH: 2.65 u[IU]/mL (ref 0.35–5.50)

## 2024-01-09 MED ORDER — TRAZODONE HCL 50 MG PO TABS
50.0000 mg | ORAL_TABLET | Freq: Every day | ORAL | 5 refills | Status: AC
  Filled 2024-01-09: qty 30, 30d supply, fill #0
  Filled 2024-02-06: qty 30, 30d supply, fill #1

## 2024-01-09 NOTE — Assessment & Plan Note (Signed)
 Chronic Lab Results  Component Value Date   HGBA1C 6.3 05/07/2022   Check a1c  Low sugar / carb diet Stressed regular exercise, weight loss

## 2024-01-09 NOTE — Assessment & Plan Note (Addendum)
 Chronic BP improved but still on high side Working on weight loss which should help No change today - f/u in June Continue hydrochlorothiazide  to 25 mg daily, Bystolic  10 mg daily, valsartan  to 320 mg daily Change diet-work on weight loss BMP

## 2024-01-09 NOTE — Assessment & Plan Note (Addendum)
 Acute Went to ED 4/8 for SOB, chest pain and lightheadedness AKI and symptoms likely related to dehydration She received fluids Workup in the ED reassuring She has been hydrating better since this time and denies any further symptoms BMP today

## 2024-01-10 NOTE — Assessment & Plan Note (Signed)
 Chronic Likely multifactorial Her sleep is not great-has interrupted sleep and often does not get more than 6 hours She is unsure if she snores-?  Sleep apnea-consider evaluation Check CBC, CMP, TSH Start trazodone  50 mg nightly Follow-up in June

## 2024-01-10 NOTE — Assessment & Plan Note (Signed)
 Has some SOB with certain activities which I think is related to her weight gain and deconditioning Testing in ED reassuring Cbc, cmp tsh today Working on weight and has lost some weight - continue efforts

## 2024-01-11 ENCOUNTER — Encounter: Payer: Self-pay | Admitting: Internal Medicine

## 2024-02-03 ENCOUNTER — Other Ambulatory Visit: Payer: Self-pay | Admitting: Internal Medicine

## 2024-02-03 ENCOUNTER — Encounter (HOSPITAL_COMMUNITY): Payer: Self-pay

## 2024-02-03 ENCOUNTER — Other Ambulatory Visit (HOSPITAL_COMMUNITY): Payer: Self-pay

## 2024-02-03 MED ORDER — MONTELUKAST SODIUM 10 MG PO TABS
10.0000 mg | ORAL_TABLET | Freq: Every day | ORAL | 0 refills | Status: DC
  Filled 2024-02-03: qty 90, 90d supply, fill #0

## 2024-02-03 MED ORDER — NEBIVOLOL HCL 10 MG PO TABS
10.0000 mg | ORAL_TABLET | Freq: Every day | ORAL | 1 refills | Status: DC
  Filled 2024-02-03: qty 90, 90d supply, fill #0
  Filled 2024-05-03 – 2024-05-05 (×2): qty 90, 90d supply, fill #1

## 2024-02-04 ENCOUNTER — Other Ambulatory Visit (HOSPITAL_COMMUNITY): Payer: Self-pay

## 2024-02-04 ENCOUNTER — Other Ambulatory Visit: Payer: Self-pay

## 2024-02-04 MED ORDER — DICLOFENAC SODIUM 75 MG PO TBEC
75.0000 mg | DELAYED_RELEASE_TABLET | Freq: Two times a day (BID) | ORAL | 1 refills | Status: DC | PRN
  Filled 2024-02-04: qty 60, 30d supply, fill #0
  Filled 2024-03-09: qty 60, 30d supply, fill #1

## 2024-02-06 ENCOUNTER — Other Ambulatory Visit (HOSPITAL_COMMUNITY): Payer: Self-pay

## 2024-03-08 ENCOUNTER — Encounter: Payer: Self-pay | Admitting: Internal Medicine

## 2024-03-08 NOTE — Patient Instructions (Addendum)
      Blood work was ordered.       Medications changes include :   None    A referral was ordered and someone will call you to schedule an appointment.     Return in about 5 months (around 08/18/2024) for Physical Exam.

## 2024-03-08 NOTE — Progress Notes (Signed)
 Subjective:    Patient ID: Dawn Townsend, female    DOB: [birth year], 58 y.o.   MRN: [id number]     HPI [name] is here for follow up of her chronic medical problems.    Medications and allergies reviewed with patient and updated if appropriate.  Current Outpatient Medications on File Prior to Visit  Medication Sig Dispense Refill  . albuterol  (VENTOLIN  HFA) 108 (90 Base) MCG/ACT inhaler INHALE 1-2 PUFFS INTO THE LUNGS EVERY 6 (SIX) HOURS AS NEEDED. 18 g 5  . Cholecalciferol (VITAMIN D PO) Take by mouth.    . hydrochlorothiazide  (HYDRODIURIL ) 25 MG tablet Take 1 tablet (25 mg total) by mouth daily. 90 tablet 3  . lansoprazole  (PREVACID ) 30 MG capsule Take 1 capsule (30 mg total) by mouth every morning. 90 capsule 1  . montelukast  (SINGULAIR ) 10 MG tablet Take 1 tablet (10 mg total) by mouth at bedtime. 90 tablet 0  . nebivolol  (BYSTOLIC ) 10 MG tablet Take 1 tablet (10 mg total) by mouth daily. 90 tablet 1  . traZODone  (DESYREL ) 50 MG tablet Take 1 tablet (50 mg total) by mouth at bedtime. 30 tablet 5  . valsartan  (DIOVAN ) 320 MG tablet Take 1 tablet (320 mg total) by mouth daily. 90 tablet 3   No current facility-administered medications on file prior to visit.     Review of Systems     Objective:  There were no vitals filed for this visit. BP Readings from Last 3 Encounters:  01/09/24 136/88  12/23/23 127/86  12/16/23 (!) 140/78   Wt Readings from Last 3 Encounters:  01/09/24 (!) 305 lb (138.3 kg)  12/16/23 (!) 308 lb (139.7 kg)  07/15/23 (!) 309 lb (140.2 kg)   There is no height or weight on file to calculate BMI.    Physical Exam Constitutional:      General: She is not in acute distress.    Appearance: Normal appearance.  HENT:     Head: Normocephalic and atraumatic.   Eyes:     Conjunctiva/sclera: Conjunctivae normal.    Cardiovascular:     Rate and Rhythm: Normal rate and regular rhythm.     Heart sounds: Normal heart sounds.  Pulmonary:      Effort: Pulmonary effort is normal. No respiratory distress.     Breath sounds: Normal breath sounds. No wheezing.   Musculoskeletal:     Cervical back: Neck supple.     Right lower leg: No edema.     Left lower leg: No edema.  Lymphadenopathy:     Cervical: No cervical adenopathy.   Skin:    General: Skin is warm and dry.     Findings: No rash.   Neurological:     Mental Status: She is alert. Mental status is at baseline.   Psychiatric:        Mood and Affect: Mood normal.        Behavior: Behavior normal.        Lab Results  Component Value Date   WBC 6.6 01/09/2024   HGB 12.8 01/09/2024   HCT 39.8 01/09/2024   PLT 281.0 01/09/2024   GLUCOSE 84 01/09/2024   CHOL 214 (H) 05/07/2022   TRIG 98.0 05/07/2022   HDL 58.10 05/07/2022   LDLCALC 136 (H) 05/07/2022   ALT 18 07/15/2023   AST 20 07/15/2023   NA 142 01/09/2024   K 4.0 01/09/2024   CL 103 01/09/2024   CREATININE 0.75 01/09/2024   BUN 30 (  H) 01/09/2024   CO2 30 01/09/2024   TSH 2.65 01/09/2024   HGBA1C 6.1 01/09/2024     Assessment & Plan:    See Problem List for Assessment and Plan of chronic medical problems.    This encounter was created in error - please disregard.

## 2024-03-09 ENCOUNTER — Encounter: Admitting: Internal Medicine

## 2024-03-09 ENCOUNTER — Other Ambulatory Visit (HOSPITAL_COMMUNITY): Payer: Self-pay

## 2024-03-09 DIAGNOSIS — I10 Essential (primary) hypertension: Secondary | ICD-10-CM

## 2024-03-09 DIAGNOSIS — N179 Acute kidney failure, unspecified: Secondary | ICD-10-CM

## 2024-03-09 DIAGNOSIS — R7303 Prediabetes: Secondary | ICD-10-CM

## 2024-03-09 DIAGNOSIS — G4709 Other insomnia: Secondary | ICD-10-CM

## 2024-03-09 NOTE — Assessment & Plan Note (Signed)
 Chronic Working on weight loss Needs to lose weight in order to have surgery She has made changes to her diet Advised diet high in protein and vegetables Advised diet low in carbohydrates and sugars Can refer to nutrition if she wishes

## 2024-03-09 NOTE — Assessment & Plan Note (Signed)
 Chronic BP controlled Continue hydrochlorothiazide  to 25 mg daily, Bystolic  10 mg daily, valsartan  to 320 mg daily Change diet-work on weight loss CMP

## 2024-03-09 NOTE — Assessment & Plan Note (Signed)
 Subacute 2-1/2 months ago was in the ED and had AKI secondary to dehydration She has been doing very good with staying hydrated CMP

## 2024-03-09 NOTE — Assessment & Plan Note (Signed)
 Chronic Started trazodone  50 mg at bedtime 2 months ago Sleep improved

## 2024-03-09 NOTE — Assessment & Plan Note (Signed)
 Chronic Lab Results  Component Value Date   HGBA1C 6.1 01/09/2024   Check a1c  Low sugar / carb diet Stressed regular exercise, weight loss

## 2024-03-30 ENCOUNTER — Other Ambulatory Visit (HOSPITAL_COMMUNITY): Payer: Self-pay

## 2024-04-06 ENCOUNTER — Other Ambulatory Visit (HOSPITAL_COMMUNITY): Payer: Self-pay

## 2024-04-07 ENCOUNTER — Other Ambulatory Visit (HOSPITAL_COMMUNITY): Payer: Self-pay

## 2024-04-08 ENCOUNTER — Other Ambulatory Visit (HOSPITAL_COMMUNITY): Payer: Self-pay

## 2024-04-08 MED ORDER — DICLOFENAC SODIUM 75 MG PO TBEC
75.0000 mg | DELAYED_RELEASE_TABLET | Freq: Two times a day (BID) | ORAL | 4 refills | Status: DC | PRN
  Filled 2024-04-08: qty 60, 30d supply, fill #0
  Filled 2024-05-05: qty 60, 30d supply, fill #1
  Filled 2024-06-11: qty 60, 30d supply, fill #2
  Filled 2024-07-09: qty 60, 30d supply, fill #3
  Filled 2024-08-06: qty 60, 30d supply, fill #4

## 2024-04-09 ENCOUNTER — Other Ambulatory Visit (HOSPITAL_COMMUNITY): Payer: Self-pay

## 2024-04-19 NOTE — Progress Notes (Signed)
      Subjective:    Patient ID: Dawn Townsend, female    DOB: 07-06-1966, 58 y.o.   MRN: [id number]     HPI Dawn Townsend is here for follow up of her chronic medical problems.    Medications and allergies reviewed with patient and updated if appropriate.  Current Outpatient Medications on File Prior to Visit  Medication Sig Dispense Refill  . albuterol  (VENTOLIN  HFA) 108 (90 Base) MCG/ACT inhaler INHALE 1-2 PUFFS INTO THE LUNGS EVERY 6 (SIX) HOURS AS NEEDED. 18 g 5  . Cholecalciferol (VITAMIN D PO) Take by mouth.    . diclofenac  (VOLTAREN ) 75 MG EC tablet Take 1 tablet (75 mg total) by mouth 2 (two) times daily as needed for pain. 60 tablet 4  . hydrochlorothiazide  (HYDRODIURIL ) 25 MG tablet Take 1 tablet (25 mg total) by mouth daily. 90 tablet 3  . nebivolol  (BYSTOLIC ) 10 MG tablet Take 1 tablet (10 mg total) by mouth daily. 90 tablet 1  . traZODone  (DESYREL ) 50 MG tablet Take 1 tablet (50 mg total) by mouth at bedtime. 30 tablet 5  . valsartan  (DIOVAN ) 320 MG tablet Take 1 tablet (320 mg total) by mouth daily. 90 tablet 3   No current facility-administered medications on file prior to visit.     Review of Systems     Objective:  There were no vitals filed for this visit. BP Readings from Last 3 Encounters:  06/15/24 130/80  05/14/24 134/84  01/09/24 136/88   Wt Readings from Last 3 Encounters:  06/15/24 (!) 304 lb 12.8 oz (138.3 kg)  05/14/24 (!) 301 lb (136.5 kg)  01/09/24 (!) 305 lb (138.3 kg)   There is no height or weight on file to calculate BMI.    Physical Exam     Lab Results  Component Value Date   WBC 6.6 01/09/2024   HGB 12.8 01/09/2024   HCT 39.8 01/09/2024   PLT 281.0 01/09/2024   GLUCOSE 84 01/09/2024   CHOL 214 (H) 05/07/2022   TRIG 98.0 05/07/2022   HDL 58.10 05/07/2022   LDLCALC 136 (H) 05/07/2022   ALT 18 07/15/2023   AST 20 07/15/2023   NA 142 01/09/2024   K 4.0 01/09/2024   CL 103 01/09/2024   CREATININE 0.75 01/09/2024   BUN 30 (H)  01/09/2024   CO2 30 01/09/2024   TSH 2.65 01/09/2024   HGBA1C 6.1 01/09/2024     Assessment & Plan:    See Problem List for Assessment and Plan of chronic medical problems.    This encounter was created in error - please disregard.

## 2024-04-19 NOTE — Patient Instructions (Addendum)
      Blood work was ordered.       Medications changes include :   None    A referral was ordered and someone will call you to schedule an appointment.     Return in about 6 months (around 10/21/2024) for Physical Exam.

## 2024-04-20 ENCOUNTER — Encounter: Admitting: Internal Medicine

## 2024-04-20 DIAGNOSIS — R7303 Prediabetes: Secondary | ICD-10-CM

## 2024-04-20 DIAGNOSIS — R600 Localized edema: Secondary | ICD-10-CM

## 2024-04-20 DIAGNOSIS — I10 Essential (primary) hypertension: Secondary | ICD-10-CM

## 2024-04-20 DIAGNOSIS — G4709 Other insomnia: Secondary | ICD-10-CM

## 2024-04-20 NOTE — Assessment & Plan Note (Signed)
 Chronic BP controlled Continue hydrochlorothiazide  to 25 mg daily, Bystolic  10 mg daily, valsartan  to 320 mg daily Change diet-work on weight loss CMP

## 2024-04-20 NOTE — Assessment & Plan Note (Signed)
 Chronic continue trazodone  50 mg at bedtime Sleep improved

## 2024-04-20 NOTE — Assessment & Plan Note (Signed)
 Chronic Improved - increased hydrochlorothiazide  last fall  Left leg > right leg - likely from L knee OA Obesity, being sedentary and OA all likely contributing Continue hydrochlorothiazide  to 25 mg daily

## 2024-04-20 NOTE — Assessment & Plan Note (Signed)
 Chronic Working on weight loss Needs to lose weight in order to have surgery - TKR She has made changes to her diet Advised diet high in protein and vegetables Advised diet low in carbohydrates and sugars Can refer to nutrition if she wishes

## 2024-04-20 NOTE — Assessment & Plan Note (Signed)
 Chronic Lab Results  Component Value Date   HGBA1C 6.1 01/09/2024   Check a1c  Low sugar / carb diet Stressed regular exercise, weight loss

## 2024-05-03 ENCOUNTER — Other Ambulatory Visit: Payer: Self-pay | Admitting: Internal Medicine

## 2024-05-03 ENCOUNTER — Other Ambulatory Visit (HOSPITAL_COMMUNITY): Payer: Self-pay

## 2024-05-03 MED ORDER — MONTELUKAST SODIUM 10 MG PO TABS
10.0000 mg | ORAL_TABLET | Freq: Every day | ORAL | 0 refills | Status: DC
  Filled 2024-05-03 – 2024-05-05 (×2): qty 90, 90d supply, fill #0

## 2024-05-03 MED ORDER — LANSOPRAZOLE 30 MG PO CPDR
30.0000 mg | DELAYED_RELEASE_CAPSULE | Freq: Every morning | ORAL | 1 refills | Status: AC
  Filled 2024-05-03 – 2024-05-05 (×2): qty 90, 90d supply, fill #0
  Filled 2024-08-06: qty 90, 90d supply, fill #1

## 2024-05-04 ENCOUNTER — Other Ambulatory Visit: Payer: Self-pay

## 2024-05-04 ENCOUNTER — Ambulatory Visit: Admitting: Internal Medicine

## 2024-05-05 ENCOUNTER — Other Ambulatory Visit (HOSPITAL_COMMUNITY): Payer: Self-pay

## 2024-05-06 ENCOUNTER — Other Ambulatory Visit (HOSPITAL_COMMUNITY): Payer: Self-pay

## 2024-05-13 NOTE — Patient Instructions (Incomplete)
    For colds - Coricidin cold products, mucinex (loosens mucus and is a cough suppressant), delsym cough syrup   Blood work was ordered.   Have this done next week.     Medications changes include :   None      Return in about 5 months (around 10/14/2024) for Physical Exam.

## 2024-05-13 NOTE — Progress Notes (Unsigned)
 Subjective:    Patient ID: Dawn Townsend, female    DOB: 04/12/1966, 58 y.o.   MRN: [id number]     HPI Marikay is here for follow up of her chronic medical problems.  Had a recent cold - it was bad.  She was wondering what she should take when she has a cold.  Fmla for knee pain -she has severe osteoarthritis  Working on weight loss -she needs to lose about 20 pounds before she is able to have her knee replaced.  Medications and allergies reviewed with patient and updated if appropriate.  Current Outpatient Medications on File Prior to Visit  Medication Sig Dispense Refill   albuterol  (VENTOLIN  HFA) 108 (90 Base) MCG/ACT inhaler INHALE 1-2 PUFFS INTO THE LUNGS EVERY 6 (SIX) HOURS AS NEEDED. 18 g 5   Cholecalciferol (VITAMIN D PO) Take by mouth.     diclofenac  (VOLTAREN ) 75 MG EC tablet Take 1 tablet (75 mg total) by mouth 2 (two) times daily as needed for pain. 60 tablet 4   hydrochlorothiazide  (HYDRODIURIL ) 25 MG tablet Take 1 tablet (25 mg total) by mouth daily. 90 tablet 3   lansoprazole  (PREVACID ) 30 MG capsule Take 1 capsule (30 mg total) by mouth every morning. 90 capsule 1   montelukast  (SINGULAIR ) 10 MG tablet Take 1 tablet (10 mg total) by mouth at bedtime. 90 tablet 0   nebivolol  (BYSTOLIC ) 10 MG tablet Take 1 tablet (10 mg total) by mouth daily. 90 tablet 1   traZODone  (DESYREL ) 50 MG tablet Take 1 tablet (50 mg total) by mouth at bedtime. 30 tablet 5   valsartan  (DIOVAN ) 320 MG tablet Take 1 tablet (320 mg total) by mouth daily. 90 tablet 3   No current facility-administered medications on file prior to visit.     Review of Systems  Constitutional:  Negative for fever.  Respiratory:  Positive for cough (mild residual cough from recent cold) and shortness of breath (chronic- stable). Negative for wheezing.   Cardiovascular:  Positive for leg swelling. Negative for chest pain.  Musculoskeletal:  Positive for arthralgias (left knee).  Neurological:  Positive for  light-headedness (mild - probably stress related) and headaches (stress related).       Objective:   Vitals:   05/14/24 1045  BP: (!) 146/78  Pulse: 68  Temp: 97.8 F (36.6 C)  SpO2: 99%   BP Readings from Last 3 Encounters:  05/14/24 (!) 146/78  01/09/24 136/88  12/23/23 127/86   Wt Readings from Last 3 Encounters:  05/14/24 (!) 301 lb (136.5 kg)  01/09/24 (!) 305 lb (138.3 kg)  12/16/23 (!) 308 lb (139.7 kg)   Body mass index is 43.19 kg/m.    Physical Exam Constitutional:      General: She is not in acute distress.    Appearance: Normal appearance.  HENT:     Head: Normocephalic and atraumatic.  Eyes:     Conjunctiva/sclera: Conjunctivae normal.  Cardiovascular:     Rate and Rhythm: Normal rate and regular rhythm.     Heart sounds: Normal heart sounds.  Pulmonary:     Effort: Pulmonary effort is normal. No respiratory distress.     Breath sounds: Normal breath sounds. No wheezing.  Musculoskeletal:     Cervical back: Neck supple.     Right lower leg: Edema (Trace) present.     Left lower leg: Edema (Mild) present.  Lymphadenopathy:     Cervical: No cervical adenopathy.  Skin:  General: Skin is warm and dry.     Findings: No rash.  Neurological:     Mental Status: She is alert. Mental status is at baseline.  Psychiatric:        Mood and Affect: Mood normal.        Behavior: Behavior normal.        Lab Results  Component Value Date   WBC 6.6 01/09/2024   HGB 12.8 01/09/2024   HCT 39.8 01/09/2024   PLT 281.0 01/09/2024   GLUCOSE 84 01/09/2024   CHOL 214 (H) 05/07/2022   TRIG 98.0 05/07/2022   HDL 58.10 05/07/2022   LDLCALC 136 (H) 05/07/2022   ALT 18 07/15/2023   AST 20 07/15/2023   NA 142 01/09/2024   K 4.0 01/09/2024   CL 103 01/09/2024   CREATININE 0.75 01/09/2024   BUN 30 (H) 01/09/2024   CO2 30 01/09/2024   TSH 2.65 01/09/2024   HGBA1C 6.1 01/09/2024     Assessment & Plan:    See Problem List for Assessment and Plan of  chronic medical problems.

## 2024-05-14 ENCOUNTER — Other Ambulatory Visit (HOSPITAL_COMMUNITY): Payer: Self-pay

## 2024-05-14 ENCOUNTER — Ambulatory Visit (INDEPENDENT_AMBULATORY_CARE_PROVIDER_SITE_OTHER): Admitting: Internal Medicine

## 2024-05-14 ENCOUNTER — Encounter: Payer: Self-pay | Admitting: Internal Medicine

## 2024-05-14 VITALS — BP 134/84 | HR 68 | Temp 97.8°F | Ht 70.0 in | Wt 301.0 lb

## 2024-05-14 DIAGNOSIS — R7303 Prediabetes: Secondary | ICD-10-CM | POA: Diagnosis not present

## 2024-05-14 DIAGNOSIS — I10 Essential (primary) hypertension: Secondary | ICD-10-CM

## 2024-05-14 DIAGNOSIS — K219 Gastro-esophageal reflux disease without esophagitis: Secondary | ICD-10-CM

## 2024-05-14 DIAGNOSIS — J45909 Unspecified asthma, uncomplicated: Secondary | ICD-10-CM | POA: Diagnosis not present

## 2024-05-14 DIAGNOSIS — G4709 Other insomnia: Secondary | ICD-10-CM

## 2024-05-14 DIAGNOSIS — R600 Localized edema: Secondary | ICD-10-CM

## 2024-05-14 MED ORDER — BLOOD PRESSURE MONITOR/ARM DEVI
0 refills | Status: AC
  Filled 2024-05-14: qty 1, 30d supply, fill #0

## 2024-05-14 NOTE — Assessment & Plan Note (Signed)
 Chronic Lab Results  Component Value Date   HGBA1C 6.1 01/09/2024   Check a1c  Low sugar / carb diet Stressed regular exercise, weight loss

## 2024-05-14 NOTE — Assessment & Plan Note (Addendum)
 Chronic Not taking trazoodone now for 1-2 months Sleep has been good-can take trazodone  as needed

## 2024-05-14 NOTE — Assessment & Plan Note (Signed)
Chronic Mild, intermittent Controlled Singular 10 mg daily Continue albuterol inhaler as needed 

## 2024-05-14 NOTE — Assessment & Plan Note (Signed)
 Chronic She was experiencing increased left lower extremity edema for a while and she was not sure why-at that time her left knee was very painful from the arthritis which I explained to her is likely the cause Working on weight loss so she can have the knee replaced Continue hydrochlorothiazide  25 mg daily Elevate legs Encouraged her to walk is much as possible

## 2024-05-14 NOTE — Assessment & Plan Note (Signed)
 Chronic BP borderline controlled-improved on repeat Blood pressure cuff prescription sent to pharmacy-encouraged her to monitor BP at home Continue hydrochlorothiazide  to 25 mg daily, Bystolic  10 mg daily, valsartan  to 320 mg daily Has changed her diet and is working on weight loss CMP

## 2024-05-14 NOTE — Assessment & Plan Note (Signed)
Chronic GERD controlled Continue lansoprazole 30 mg daily  

## 2024-05-25 ENCOUNTER — Other Ambulatory Visit (HOSPITAL_COMMUNITY): Payer: Self-pay

## 2024-05-28 ENCOUNTER — Telehealth: Payer: Self-pay | Admitting: Radiology

## 2024-05-28 NOTE — Telephone Encounter (Signed)
 Copied from CRM #[id number]. Topic: Referral - Question >> May 28, 2024  1:07 PM Chasity T wrote: Reason for CRM: patient is requesting a referral to a dermatologist for itching on the side of her hair and it burns sometimes. Please contact her back to discuss.

## 2024-06-01 ENCOUNTER — Telehealth: Payer: Self-pay | Admitting: Radiology

## 2024-06-01 DIAGNOSIS — H5203 Hypermetropia, bilateral: Secondary | ICD-10-CM | POA: Diagnosis not present

## 2024-06-01 NOTE — Telephone Encounter (Signed)
 Copied from CRM [id number]. Topic: Referral - Question >> Jun 01, 2024 11:44 AM Revonda D wrote: Reason for CRM:  patient is requesting a referral to a dermatologist for itching on the side of her hair , behind ear and it burns sometimes. Please contact her back to discuss.  Update: Pt is calling back to check the status of this request. Pt stated that she sent this request last Friday and hasn't received a callback. Pt would like a callback today with an update.

## 2024-06-01 NOTE — Telephone Encounter (Signed)
 Called patient today and left message.  If she calls back these are her options:  If we refer her she will need to be seen as this ws not discussed during her last visit and we need updated office notes to refer her.  Second option is she can call and see if she can self schedule with out referral. And if she can she will not need appointment.

## 2024-06-07 ENCOUNTER — Encounter: Payer: Self-pay | Admitting: Internal Medicine

## 2024-06-07 NOTE — Progress Notes (Deleted)
    Subjective:    Patient ID: Dawn Townsend, female    DOB: September 12, 1966, 58 y.o.   MRN: [id number]      HPI Dawn Townsend is here for No chief complaint on file.   Itchiness behind ears and scalp -    Ketoconazole shamp, clobetasol, tacrolimus  Medications and allergies reviewed with patient and updated if appropriate.  Current Outpatient Medications on File Prior to Visit  Medication Sig Dispense Refill   albuterol  (VENTOLIN  HFA) 108 (90 Base) MCG/ACT inhaler INHALE 1-2 PUFFS INTO THE LUNGS EVERY 6 (SIX) HOURS AS NEEDED. 18 g 5   Blood Pressure Monitoring (BLOOD PRESSURE MONITOR/ARM) DEVI Use as directed to monitor blood pressure at home. 1 each 0   Cholecalciferol (VITAMIN D PO) Take by mouth.     diclofenac  (VOLTAREN ) 75 MG EC tablet Take 1 tablet (75 mg total) by mouth 2 (two) times daily as needed for pain. 60 tablet 4   hydrochlorothiazide  (HYDRODIURIL ) 25 MG tablet Take 1 tablet (25 mg total) by mouth daily. 90 tablet 3   lansoprazole  (PREVACID ) 30 MG capsule Take 1 capsule (30 mg total) by mouth every morning. 90 capsule 1   montelukast  (SINGULAIR ) 10 MG tablet Take 1 tablet (10 mg total) by mouth at bedtime. 90 tablet 0   nebivolol  (BYSTOLIC ) 10 MG tablet Take 1 tablet (10 mg total) by mouth daily. 90 tablet 1   traZODone  (DESYREL ) 50 MG tablet Take 1 tablet (50 mg total) by mouth at bedtime. 30 tablet 5   valsartan  (DIOVAN ) 320 MG tablet Take 1 tablet (320 mg total) by mouth daily. 90 tablet 3   No current facility-administered medications on file prior to visit.    Review of Systems     Objective:  There were no vitals filed for this visit. BP Readings from Last 3 Encounters:  05/14/24 134/84  01/09/24 136/88  12/23/23 127/86   Wt Readings from Last 3 Encounters:  05/14/24 (!) 301 lb (136.5 kg)  01/09/24 (!) 305 lb (138.3 kg)  12/16/23 (!) 308 lb (139.7 kg)   There is no height or weight on file to calculate BMI.    Physical Exam         Assessment &  Plan:    See Problem List for Assessment and Plan of chronic medical problems.

## 2024-06-08 ENCOUNTER — Ambulatory Visit: Admitting: Internal Medicine

## 2024-06-11 ENCOUNTER — Ambulatory Visit: Payer: Self-pay

## 2024-06-11 ENCOUNTER — Other Ambulatory Visit (HOSPITAL_COMMUNITY): Payer: Self-pay

## 2024-06-11 NOTE — Telephone Encounter (Signed)
 Spoke with patient today.  Will have her come in on Tuesday as that is her only day off.  She will go to UC if it gets worse over the weekend.

## 2024-06-11 NOTE — Telephone Encounter (Signed)
 FYI Only or Action Required?: Action required by provider: request for appointment and clinical question for provider.  Patient was last seen in primary care on 05/14/2024 by Geofm Glade PARAS, MD.  Called Nurse Triage reporting Pruritis.  Symptoms began several weeks ago.  Interventions attempted: Nothing.  Symptoms are: unchanged.  Triage Disposition: See PCP When Office is Open (Within 3 Days)  Patient/caregiver understands and will follow disposition?: No, wishes to speak with PCP  Copied from CRM #[id number]. Topic: Clinical - Red Word Triage >> Jun 11, 2024 11:10 AM Adelita E wrote: [facility] that prompted transfer to Nurse Triage: Very itchy behind ears and on scalp. Patient thinks it may be infected, going on for 2-3 weeks. Reason for Disposition  [1] MODERATE-SEVERE local itching (i.e., interferes with work, school, activities) AND [2] not improved after 24 hours of hydrocortisone cream  Answer Assessment - Initial Assessment Questions No available appts today. Patient requesting appt and CALL BACK Patient reports arrived at clinic today, thought appt was today, missed appt 06/08/24 Advised UC today. 1. DESCRIPTION: Describe the itching you are having. Where is it located?     Itching; behind ears and scalp, neck; dry patches eyelids 2. SEVERITY: How bad is it?      severe 3. SCRATCHING: Are there any scratch marks? Bleeding?     Sores from scrating, drainage when scratching; clear, unsure if it's red, denies hot to touch 4. ONSET: When did the itching begin? (e.g., minutes, hours, days ago)     3 weeks ago 5. CAUSE: What do you think is causing the itching?      Thinks ezcema flare up 6. OTHER SYMPTOMS: Do you have any other symptoms? (e.g., fever, rash)  Denies fever, chills, rash  Protocols used: Itching - Localized-A-AH

## 2024-06-14 NOTE — Progress Notes (Unsigned)
    Subjective:    Patient ID: Dawn Townsend, female    DOB: 11-14-1965, 58 y.o.   MRN: [id number]      HPI Dawn Townsend is here for No chief complaint on file.   Itchiness behind ears and scalp -    Ketoconazole shamp, clobetasol, tacrolimus  Medications and allergies reviewed with patient and updated if appropriate.  Current Outpatient Medications on File Prior to Visit  Medication Sig Dispense Refill   albuterol  (VENTOLIN  HFA) 108 (90 Base) MCG/ACT inhaler INHALE 1-2 PUFFS INTO THE LUNGS EVERY 6 (SIX) HOURS AS NEEDED. 18 g 5   Blood Pressure Monitoring (BLOOD PRESSURE MONITOR/ARM) DEVI Use as directed to monitor blood pressure at home. 1 each 0   Cholecalciferol (VITAMIN D PO) Take by mouth.     diclofenac  (VOLTAREN ) 75 MG EC tablet Take 1 tablet (75 mg total) by mouth 2 (two) times daily as needed for pain. 60 tablet 4   hydrochlorothiazide  (HYDRODIURIL ) 25 MG tablet Take 1 tablet (25 mg total) by mouth daily. 90 tablet 3   lansoprazole  (PREVACID ) 30 MG capsule Take 1 capsule (30 mg total) by mouth every morning. 90 capsule 1   montelukast  (SINGULAIR ) 10 MG tablet Take 1 tablet (10 mg total) by mouth at bedtime. 90 tablet 0   nebivolol  (BYSTOLIC ) 10 MG tablet Take 1 tablet (10 mg total) by mouth daily. 90 tablet 1   traZODone  (DESYREL ) 50 MG tablet Take 1 tablet (50 mg total) by mouth at bedtime. 30 tablet 5   valsartan  (DIOVAN ) 320 MG tablet Take 1 tablet (320 mg total) by mouth daily. 90 tablet 3   No current facility-administered medications on file prior to visit.    Review of Systems     Objective:  There were no vitals filed for this visit. BP Readings from Last 3 Encounters:  05/14/24 134/84  01/09/24 136/88  12/23/23 127/86   Wt Readings from Last 3 Encounters:  05/14/24 (!) 301 lb (136.5 kg)  01/09/24 (!) 305 lb (138.3 kg)  12/16/23 (!) 308 lb (139.7 kg)   There is no height or weight on file to calculate BMI.    Physical Exam         Assessment &  Plan:    See Problem List for Assessment and Plan of chronic medical problems.

## 2024-06-15 ENCOUNTER — Other Ambulatory Visit (HOSPITAL_COMMUNITY): Payer: Self-pay

## 2024-06-15 ENCOUNTER — Encounter: Payer: Self-pay | Admitting: Internal Medicine

## 2024-06-15 ENCOUNTER — Ambulatory Visit: Admitting: Internal Medicine

## 2024-06-15 VITALS — BP 130/80 | HR 63 | Temp 97.6°F | Ht 70.0 in | Wt 304.8 lb

## 2024-06-15 DIAGNOSIS — L309 Dermatitis, unspecified: Secondary | ICD-10-CM

## 2024-06-15 DIAGNOSIS — I10 Essential (primary) hypertension: Secondary | ICD-10-CM

## 2024-06-15 MED ORDER — PIMECROLIMUS 1 % EX CREA
TOPICAL_CREAM | Freq: Two times a day (BID) | CUTANEOUS | 0 refills | Status: AC | PRN
  Filled 2024-06-15: qty 30, 30d supply, fill #0

## 2024-06-15 MED ORDER — CLOBETASOL PROPIONATE 0.05 % EX SOLN
1.0000 | Freq: Two times a day (BID) | CUTANEOUS | 5 refills | Status: AC | PRN
  Filled 2024-06-15: qty 50, 25d supply, fill #0

## 2024-06-15 MED ORDER — TRIAMCINOLONE ACETONIDE 0.1 % EX CREA
1.0000 | TOPICAL_CREAM | Freq: Two times a day (BID) | CUTANEOUS | 0 refills | Status: AC | PRN
  Filled 2024-06-15: qty 30, 15d supply, fill #0

## 2024-06-15 NOTE — Patient Instructions (Addendum)
      Medications changes include :   steroid solution and cream for your scalp and neck.  Elidel for your eyelids.      A referral was ordered for Dermatology and someone will call you to schedule an appointment.     Return if symptoms worsen or fail to improve.

## 2024-06-15 NOTE — Assessment & Plan Note (Signed)
 Chronic BP controlled Continue hydrochlorothiazide  to 25 mg daily, Bystolic  10 mg daily, valsartan  to 320 mg daily

## 2024-06-15 NOTE — Assessment & Plan Note (Signed)
 Chronic Current flare-along hairline, posterior ears and posterior neck Start clobetasol external solution 0.05% twice daily as needed-advised not to take more than 14 days in a row Triamcinolone  0.1% cream twice daily as needed posterior neck and posterior ears-do not use for more than 14 days neuro Elidel cream 1% twice daily as needed upper eyelids twice daily as needed I advised using lotion daily Referral to dermatology

## 2024-06-25 ENCOUNTER — Ambulatory Visit: Admitting: Internal Medicine

## 2024-07-09 ENCOUNTER — Other Ambulatory Visit (HOSPITAL_COMMUNITY): Payer: Self-pay

## 2024-07-15 ENCOUNTER — Other Ambulatory Visit (HOSPITAL_COMMUNITY): Payer: Self-pay

## 2024-08-06 ENCOUNTER — Other Ambulatory Visit: Payer: Self-pay | Admitting: Internal Medicine

## 2024-08-06 ENCOUNTER — Other Ambulatory Visit (HOSPITAL_COMMUNITY): Payer: Self-pay

## 2024-08-06 MED ORDER — NEBIVOLOL HCL 10 MG PO TABS
10.0000 mg | ORAL_TABLET | Freq: Every day | ORAL | 1 refills | Status: AC
  Filled 2024-08-06: qty 90, 90d supply, fill #0

## 2024-08-06 MED ORDER — MONTELUKAST SODIUM 10 MG PO TABS
10.0000 mg | ORAL_TABLET | Freq: Every day | ORAL | 0 refills | Status: AC
  Filled 2024-08-06: qty 90, 90d supply, fill #0

## 2024-08-11 ENCOUNTER — Other Ambulatory Visit (HOSPITAL_COMMUNITY): Payer: Self-pay

## 2024-09-22 ENCOUNTER — Other Ambulatory Visit: Payer: Self-pay

## 2024-09-23 ENCOUNTER — Other Ambulatory Visit (HOSPITAL_COMMUNITY): Payer: Self-pay

## 2024-09-23 ENCOUNTER — Encounter (HOSPITAL_COMMUNITY): Payer: Self-pay

## 2024-09-23 MED ORDER — DICLOFENAC SODIUM 75 MG PO TBEC
75.0000 mg | DELAYED_RELEASE_TABLET | Freq: Two times a day (BID) | ORAL | 1 refills | Status: DC | PRN
  Filled 2024-09-23 – 2024-10-20 (×2): qty 60, 30d supply, fill #0
  Filled 2024-10-20: qty 60, 30d supply, fill #1
  Filled 2024-10-20: qty 60, 30d supply, fill #0
  Filled 2024-10-20: qty 60, 30d supply, fill #1

## 2024-10-01 ENCOUNTER — Ambulatory Visit: Admitting: Family Medicine

## 2024-10-01 ENCOUNTER — Ambulatory Visit

## 2024-10-01 ENCOUNTER — Encounter: Payer: Self-pay | Admitting: Family Medicine

## 2024-10-01 ENCOUNTER — Other Ambulatory Visit (HOSPITAL_COMMUNITY): Payer: Self-pay

## 2024-10-01 VITALS — BP 156/98 | HR 64 | Ht 70.0 in | Wt 300.0 lb

## 2024-10-01 DIAGNOSIS — G8929 Other chronic pain: Secondary | ICD-10-CM

## 2024-10-01 DIAGNOSIS — M47816 Spondylosis without myelopathy or radiculopathy, lumbar region: Secondary | ICD-10-CM | POA: Diagnosis not present

## 2024-10-01 DIAGNOSIS — M545 Low back pain, unspecified: Secondary | ICD-10-CM

## 2024-10-01 MED ORDER — MELOXICAM 15 MG PO TABS
15.0000 mg | ORAL_TABLET | Freq: Every day | ORAL | 3 refills | Status: AC
  Filled 2024-10-01: qty 30, 30d supply, fill #0

## 2024-10-01 NOTE — Patient Instructions (Addendum)
 Thank you for coming in today.   Please get an Xray today before you leave   A referral for physical therapy has been submitted. A representative from the physical therapy office will contact you to coordinate scheduling after confirming your benefits with your insurance provider. If you do not hear from the physical therapy office within the next 1-2 weeks, please let us  know.   See you back after you complete Physical Therapy.

## 2024-10-01 NOTE — Progress Notes (Signed)
"       ° °  I, Leotis Batter, CMA acting as a scribe for Artist Lloyd, MD.  Dawn Townsend is a 59 y.o. female who presents to [facility] today for exacerbation of her LBP. Pt was last seen by Dr. Lloyd on 01/14/23 and was prescribed prednisone  and referred to pain management. Work note provided.  Today, pt reports worsening lower back pain. Was not happy with last facility that she was referred to. Sx exacerbated by working. Has not missed any days. Sx are bilateral and worse with activity, improves with rest and leaning forwards. Occasional radiating pain into the gluteal region. Denies radiating pain into the legs. Some weakness but denied n/t. Taking Diclofenac . No improvement with back injections.   Patient had some back procedures at [facility] neurosurgery Dr. Darlis.  Says that she has had medial branch block and ablations which did not help all that much.  Dx imaging: 08/03/22 L-spine MRI             02/08/22 L-spine XR  Pertinent review of systems: No fevers or chills  Relevant historical information: Hypertension   Exam:  BP (!) 156/98   Pulse 64   Ht 5' 10 (1.778 m)   Wt 300 lb (136.1 kg)   LMP 06/02/2014   SpO2 100%   BMI 43.05 kg/m  General: Well Developed, well nourished, and in no acute distress.   MSK: L-spine: Normal appearing. Nontender palpation spinal midline.  Decreased lumbar motion.    Lab and Radiology Results  X-ray images lumbar spine obtained today personally and independently interpreted. Multilevel degenerative changes.  No acute fractures are visible. Await formal radiology review     Assessment and Plan: 59 y.o. female with chronic low back pain without much radiculopathy.  Unfortunately failing typical even advanced treatment options including medial branch block and ablation.  Her most recent lumbar spine MRI is from November 2023 so it is a bit dated now.  Plan for updated x-ray and trial of aquatic physical therapy.   Recheck after completion of PT if not better consider updating MRI for procedural planning.  Meloxicam  prescribed for temporary control of pain.   PDMP not reviewed this encounter. Orders Placed This Encounter  Procedures   DG Lumbar Spine 2-3 Views    Standing Status:   Future    Number of Occurrences:   1    Expiration Date:   11/01/2024    Reason for Exam (SYMPTOM  OR DIAGNOSIS REQUIRED):   low back pain    Preferred imaging location?:   [facility]    Is patient pregnant?:   No   Ambulatory referral to Physical Therapy    Referral Priority:   Routine    Referral Type:   Physical Medicine    Referral Reason:   Specialty Services Required    Requested Specialty:   Physical Therapy    Number of Visits Requested:   1   Meds ordered this encounter  Medications   meloxicam  (MOBIC ) 15 MG tablet    Sig: Take 1 tablet (15 mg total) by mouth daily with breakfast for 2 weeks, then take 1 tablet daily as needed for pain.    Dispense:  30 tablet    Refill:  3     Discussed warning signs or symptoms. Please see discharge instructions. Patient expresses understanding.   The above documentation has been reviewed and is accurate and complete Artist Lloyd, M.D.   "

## 2024-10-06 ENCOUNTER — Ambulatory Visit: Payer: Self-pay | Admitting: Family Medicine

## 2024-10-06 NOTE — Progress Notes (Signed)
 Low back x-ray shows medium arthritis

## 2024-10-13 ENCOUNTER — Other Ambulatory Visit (HOSPITAL_COMMUNITY): Payer: Self-pay

## 2024-10-20 ENCOUNTER — Other Ambulatory Visit (HOSPITAL_COMMUNITY): Payer: Self-pay

## 2024-10-20 ENCOUNTER — Other Ambulatory Visit: Payer: Self-pay

## 2024-10-20 ENCOUNTER — Other Ambulatory Visit: Payer: Self-pay | Admitting: Internal Medicine

## 2024-10-20 MED ORDER — DICLOFENAC SODIUM 75 MG PO TBEC
75.0000 mg | DELAYED_RELEASE_TABLET | Freq: Two times a day (BID) | ORAL | 1 refills | Status: AC | PRN
  Filled 2024-10-20: qty 60, 30d supply, fill #0

## 2024-10-21 ENCOUNTER — Other Ambulatory Visit (HOSPITAL_COMMUNITY): Payer: Self-pay

## 2024-11-18 ENCOUNTER — Encounter: Admitting: Internal Medicine

## 2025-01-20 ENCOUNTER — Ambulatory Visit: Admitting: Dermatology
